# Patient Record
Sex: Male | Born: 1955 | Race: White | Hispanic: No | Smoking: Current every day smoker
Health system: Southern US, Community
[De-identification: ages and names within clinical notes are randomized; demographics above are authoritative.]

## PROBLEM LIST (undated history)

## (undated) DIAGNOSIS — N400 Enlarged prostate without lower urinary tract symptoms: Secondary | ICD-10-CM

## (undated) DIAGNOSIS — I714 Abdominal aortic aneurysm, without rupture, unspecified: Secondary | ICD-10-CM

## (undated) DIAGNOSIS — K219 Gastro-esophageal reflux disease without esophagitis: Secondary | ICD-10-CM

## (undated) DIAGNOSIS — R0600 Dyspnea, unspecified: Secondary | ICD-10-CM

## (undated) DIAGNOSIS — J449 Chronic obstructive pulmonary disease, unspecified: Secondary | ICD-10-CM

## (undated) DIAGNOSIS — I10 Essential (primary) hypertension: Secondary | ICD-10-CM

## (undated) DIAGNOSIS — R112 Nausea with vomiting, unspecified: Secondary | ICD-10-CM

## (undated) DIAGNOSIS — M545 Low back pain: Secondary | ICD-10-CM

## (undated) DIAGNOSIS — I82409 Acute embolism and thrombosis of unspecified deep veins of unspecified lower extremity: Secondary | ICD-10-CM

## (undated) DIAGNOSIS — Z86010 Personal history of colonic polyps: Secondary | ICD-10-CM

## (undated) DIAGNOSIS — I739 Peripheral vascular disease, unspecified: Secondary | ICD-10-CM

## (undated) DIAGNOSIS — F40240 Claustrophobia: Secondary | ICD-10-CM

## (undated) DIAGNOSIS — M1990 Unspecified osteoarthritis, unspecified site: Secondary | ICD-10-CM

## (undated) DIAGNOSIS — R3912 Poor urinary stream: Secondary | ICD-10-CM

## (undated) DIAGNOSIS — R569 Unspecified convulsions: Secondary | ICD-10-CM

## (undated) DIAGNOSIS — E7800 Pure hypercholesterolemia, unspecified: Secondary | ICD-10-CM

## (undated) DIAGNOSIS — Z9889 Other specified postprocedural states: Secondary | ICD-10-CM

## (undated) HISTORY — DX: Abdominal aortic aneurysm, without rupture, unspecified: I71.40

## (undated) HISTORY — DX: Gastro-esophageal reflux disease without esophagitis: K21.9

## (undated) HISTORY — DX: Peripheral vascular disease, unspecified: I73.9

## (undated) HISTORY — DX: Low back pain: M54.5

## (undated) HISTORY — DX: Claustrophobia: F40.240

## (undated) HISTORY — PX: MOHS SURGERY: SHX181

## (undated) HISTORY — PX: PR VEIN BYPASS GRAFT,AORTO-FEM-POP: 35551

## (undated) HISTORY — DX: Abdominal aortic aneurysm, without rupture: I71.4

## (undated) HISTORY — DX: Essential (primary) hypertension: I10

## (undated) HISTORY — PX: OTHER SURGICAL HISTORY: SHX169

## (undated) HISTORY — DX: Chronic obstructive pulmonary disease, unspecified: J44.9

## (undated) HISTORY — PX: FOOT SURGERY: SHX648

## (undated) HISTORY — DX: Acute embolism and thrombosis of unspecified deep veins of unspecified lower extremity: I82.409

## (undated) HISTORY — DX: Pure hypercholesterolemia, unspecified: E78.00

## (undated) HISTORY — PX: CARPAL TUNNEL RELEASE: SHX101

## (undated) HISTORY — DX: Personal history of colonic polyps: Z86.010

## (undated) HISTORY — DX: Unspecified osteoarthritis, unspecified site: M19.90

---

## 1997-11-21 ENCOUNTER — Ambulatory Visit (HOSPITAL_COMMUNITY): Admission: RE | Admit: 1997-11-21 | Discharge: 1997-11-21 | Payer: Self-pay | Admitting: Podiatry

## 1997-11-21 ENCOUNTER — Encounter: Payer: Self-pay | Admitting: Podiatry

## 1998-09-30 ENCOUNTER — Ambulatory Visit (HOSPITAL_BASED_OUTPATIENT_CLINIC_OR_DEPARTMENT_OTHER): Admission: RE | Admit: 1998-09-30 | Discharge: 1998-09-30 | Payer: Self-pay | Admitting: Orthopedic Surgery

## 2000-05-31 ENCOUNTER — Emergency Department (HOSPITAL_COMMUNITY): Admission: EM | Admit: 2000-05-31 | Discharge: 2000-05-31 | Payer: Self-pay | Admitting: Emergency Medicine

## 2002-05-22 ENCOUNTER — Ambulatory Visit (HOSPITAL_BASED_OUTPATIENT_CLINIC_OR_DEPARTMENT_OTHER): Admission: RE | Admit: 2002-05-22 | Discharge: 2002-05-22 | Payer: Self-pay | Admitting: *Deleted

## 2002-05-25 ENCOUNTER — Emergency Department (HOSPITAL_COMMUNITY): Admission: EM | Admit: 2002-05-25 | Discharge: 2002-05-25 | Payer: Self-pay | Admitting: Emergency Medicine

## 2002-06-05 ENCOUNTER — Ambulatory Visit (HOSPITAL_BASED_OUTPATIENT_CLINIC_OR_DEPARTMENT_OTHER): Admission: RE | Admit: 2002-06-05 | Discharge: 2002-06-05 | Payer: Self-pay | Admitting: *Deleted

## 2004-01-15 ENCOUNTER — Ambulatory Visit: Payer: Self-pay | Admitting: Internal Medicine

## 2004-01-23 ENCOUNTER — Ambulatory Visit: Payer: Self-pay | Admitting: Internal Medicine

## 2004-03-14 HISTORY — PX: TENDON REPAIR: SHX5111

## 2004-07-15 ENCOUNTER — Ambulatory Visit: Payer: Self-pay | Admitting: Internal Medicine

## 2004-07-23 ENCOUNTER — Ambulatory Visit: Payer: Self-pay | Admitting: Internal Medicine

## 2004-11-25 ENCOUNTER — Ambulatory Visit: Payer: Self-pay | Admitting: Internal Medicine

## 2004-11-28 ENCOUNTER — Encounter: Admission: RE | Admit: 2004-11-28 | Discharge: 2004-11-28 | Payer: Self-pay | Admitting: Internal Medicine

## 2006-04-03 ENCOUNTER — Ambulatory Visit: Payer: Self-pay | Admitting: Internal Medicine

## 2006-04-03 LAB — CONVERTED CEMR LAB
ALT: 28 units/L (ref 0–40)
AST: 25 units/L (ref 0–37)
Albumin: 4.1 g/dL (ref 3.5–5.2)
Alkaline Phosphatase: 44 units/L (ref 39–117)
BUN: 17 mg/dL (ref 6–23)
CO2: 28 meq/L (ref 19–32)
Calcium: 9.8 mg/dL (ref 8.4–10.5)
Chloride: 103 meq/L (ref 96–112)
Cholesterol: 152 mg/dL (ref 0–200)
Creatinine, Ser: 1.3 mg/dL (ref 0.4–1.5)
GFR calc Af Amer: 75 mL/min
GFR calc non Af Amer: 62 mL/min
Glucose, Bld: 117 mg/dL — ABNORMAL HIGH (ref 70–99)
HCT: 45.8 % (ref 39.0–52.0)
Hemoglobin: 16.3 g/dL (ref 13.0–17.0)
MCHC: 35.7 g/dL (ref 30.0–36.0)
MCV: 89.6 fL (ref 78.0–100.0)
PSA: 0.77 ng/mL (ref 0.10–4.00)
Platelets: 207 10*3/uL (ref 150–400)
Potassium: 3.9 meq/L (ref 3.5–5.1)
RBC: 5.11 M/uL (ref 4.22–5.81)
RDW: 12.4 % (ref 11.5–14.6)
Sodium: 139 meq/L (ref 135–145)
Total Bilirubin: 0.7 mg/dL (ref 0.3–1.2)
Total Protein: 7.3 g/dL (ref 6.0–8.3)
WBC: 9 10*3/uL (ref 4.5–10.5)

## 2006-04-07 ENCOUNTER — Ambulatory Visit: Payer: Self-pay | Admitting: Internal Medicine

## 2006-04-29 ENCOUNTER — Emergency Department (HOSPITAL_COMMUNITY): Admission: EM | Admit: 2006-04-29 | Discharge: 2006-04-29 | Payer: Self-pay | Admitting: Emergency Medicine

## 2006-06-07 ENCOUNTER — Ambulatory Visit: Payer: Self-pay | Admitting: Internal Medicine

## 2006-06-07 LAB — CONVERTED CEMR LAB
ALT: 26 units/L (ref 0–40)
AST: 29 units/L (ref 0–37)
Albumin: 4.3 g/dL (ref 3.5–5.2)
Alkaline Phosphatase: 37 units/L — ABNORMAL LOW (ref 39–117)
BUN: 16 mg/dL (ref 6–23)
Basophils Absolute: 0.1 10*3/uL (ref 0.0–0.1)
Basophils Relative: 0.7 % (ref 0.0–1.0)
Bilirubin, Direct: 0.1 mg/dL (ref 0.0–0.3)
CO2: 29 meq/L (ref 19–32)
Calcium: 9.7 mg/dL (ref 8.4–10.5)
Chloride: 103 meq/L (ref 96–112)
Cholesterol: 229 mg/dL (ref 0–200)
Creatinine, Ser: 1.2 mg/dL (ref 0.4–1.5)
Direct LDL: 60.2 mg/dL
Eosinophils Absolute: 0.4 10*3/uL (ref 0.0–0.6)
Eosinophils Relative: 6.1 % — ABNORMAL HIGH (ref 0.0–5.0)
GFR calc Af Amer: 82 mL/min
GFR calc non Af Amer: 68 mL/min
Glucose, Bld: 124 mg/dL — ABNORMAL HIGH (ref 70–99)
HCT: 44.1 % (ref 39.0–52.0)
HDL: 25.5 mg/dL — ABNORMAL LOW (ref >39.0)
Hemoglobin: 15.7 g/dL (ref 13.0–17.0)
Hgb A1c MFr Bld: 5.6 % (ref 4.6–6.0)
Lymphocytes Relative: 25.7 % (ref 12.0–46.0)
MCHC: 35.5 g/dL (ref 30.0–36.0)
MCV: 89.5 fL (ref 78.0–100.0)
Monocytes Absolute: 0.9 10*3/uL — ABNORMAL HIGH (ref 0.2–0.7)
Monocytes Relative: 12 % — ABNORMAL HIGH (ref 3.0–11.0)
Neutro Abs: 3.9 10*3/uL (ref 1.4–7.7)
Neutrophils Relative %: 55.5 % (ref 43.0–77.0)
PSA: 0.88 ng/mL (ref 0.10–4.00)
Platelets: 237 10*3/uL (ref 150–400)
Potassium: 4.4 meq/L (ref 3.5–5.1)
RBC: 4.92 M/uL (ref 4.22–5.81)
RDW: 13.1 % (ref 11.5–14.6)
Sodium: 140 meq/L (ref 135–145)
TSH: 1.24 microintl units/mL (ref 0.35–5.50)
Total Bilirubin: 0.8 mg/dL (ref 0.3–1.2)
Total CHOL/HDL Ratio: 9
Total Protein: 7.2 g/dL (ref 6.0–8.3)
Triglycerides: 884 mg/dL (ref 0–149)
VLDL: 177 mg/dL — ABNORMAL HIGH (ref 0–40)
WBC: 7.2 10*3/uL (ref 4.5–10.5)

## 2006-06-16 ENCOUNTER — Ambulatory Visit: Payer: Self-pay | Admitting: Internal Medicine

## 2006-07-07 ENCOUNTER — Encounter: Payer: Self-pay | Admitting: Internal Medicine

## 2006-10-25 ENCOUNTER — Telehealth: Payer: Self-pay | Admitting: Internal Medicine

## 2006-11-08 DIAGNOSIS — K219 Gastro-esophageal reflux disease without esophagitis: Secondary | ICD-10-CM

## 2006-11-08 DIAGNOSIS — I10 Essential (primary) hypertension: Secondary | ICD-10-CM | POA: Insufficient documentation

## 2006-11-08 HISTORY — DX: Gastro-esophageal reflux disease without esophagitis: K21.9

## 2006-11-08 HISTORY — DX: Essential (primary) hypertension: I10

## 2007-03-29 ENCOUNTER — Telehealth: Payer: Self-pay | Admitting: Internal Medicine

## 2007-05-02 ENCOUNTER — Ambulatory Visit: Payer: Self-pay | Admitting: Internal Medicine

## 2007-05-02 DIAGNOSIS — Z860100 Personal history of colon polyps, unspecified: Secondary | ICD-10-CM

## 2007-05-02 DIAGNOSIS — C44300 Unspecified malignant neoplasm of skin of unspecified part of face: Secondary | ICD-10-CM | POA: Insufficient documentation

## 2007-05-02 DIAGNOSIS — E7800 Pure hypercholesterolemia, unspecified: Secondary | ICD-10-CM

## 2007-05-02 DIAGNOSIS — C148 Malignant neoplasm of overlapping sites of lip, oral cavity and pharynx: Secondary | ICD-10-CM | POA: Insufficient documentation

## 2007-05-02 HISTORY — DX: Personal history of colonic polyps: Z86.010

## 2007-05-02 HISTORY — DX: Pure hypercholesterolemia, unspecified: E78.00

## 2007-05-02 HISTORY — DX: Personal history of colon polyps, unspecified: Z86.0100

## 2007-05-28 ENCOUNTER — Encounter: Payer: Self-pay | Admitting: Internal Medicine

## 2007-07-23 ENCOUNTER — Ambulatory Visit: Payer: Self-pay | Admitting: Internal Medicine

## 2007-07-23 LAB — CONVERTED CEMR LAB
ALT: 17 units/L (ref 0–53)
AST: 19 units/L (ref 0–37)
Albumin: 4.3 g/dL (ref 3.5–5.2)
Alkaline Phosphatase: 40 units/L (ref 39–117)
BUN: 16 mg/dL (ref 6–23)
Basophils Absolute: 0 10*3/uL (ref 0.0–0.1)
Basophils Relative: 0.6 % (ref 0.0–1.0)
Bilirubin, Direct: 0.1 mg/dL (ref 0.0–0.3)
CO2: 28 meq/L (ref 19–32)
Calcium: 9.7 mg/dL (ref 8.4–10.5)
Chloride: 105 meq/L (ref 96–112)
Cholesterol: 175 mg/dL (ref 0–200)
Creatinine, Ser: 1.3 mg/dL (ref 0.4–1.5)
Direct LDL: 73.2 mg/dL
Eosinophils Absolute: 0.3 10*3/uL (ref 0.0–0.7)
Eosinophils Relative: 4.8 % (ref 0.0–5.0)
GFR calc Af Amer: 75 mL/min
GFR calc non Af Amer: 62 mL/min
Glucose, Bld: 107 mg/dL — ABNORMAL HIGH (ref 70–99)
HCT: 47.4 % (ref 39.0–52.0)
HDL: 21.7 mg/dL — ABNORMAL LOW (ref >39.0)
Hemoglobin: 15.9 g/dL (ref 13.0–17.0)
Lymphocytes Relative: 24.8 % (ref 12.0–46.0)
MCHC: 33.5 g/dL (ref 30.0–36.0)
MCV: 91.3 fL (ref 78.0–100.0)
Monocytes Absolute: 0.7 10*3/uL (ref 0.1–1.0)
Monocytes Relative: 9.9 % (ref 3.0–12.0)
Neutro Abs: 4.3 10*3/uL (ref 1.4–7.7)
Neutrophils Relative %: 59.9 % (ref 43.0–77.0)
PSA: 0.79 ng/mL (ref 0.10–4.00)
Platelets: 222 10*3/uL (ref 150–400)
Potassium: 4.5 meq/L (ref 3.5–5.1)
RBC: 5.19 M/uL (ref 4.22–5.81)
RDW: 12.7 % (ref 11.5–14.6)
Sodium: 137 meq/L (ref 135–145)
TSH: 0.94 microintl units/mL (ref 0.35–5.50)
Total Bilirubin: 0.7 mg/dL (ref 0.3–1.2)
Total CHOL/HDL Ratio: 8.1
Total Protein: 7.7 g/dL (ref 6.0–8.3)
Triglycerides: 438 mg/dL (ref 0–149)
VLDL: 88 mg/dL — ABNORMAL HIGH (ref 0–40)
WBC: 7 10*3/uL (ref 4.5–10.5)

## 2007-08-29 ENCOUNTER — Encounter: Payer: Self-pay | Admitting: Internal Medicine

## 2007-11-22 ENCOUNTER — Ambulatory Visit: Payer: Self-pay | Admitting: Internal Medicine

## 2007-11-22 DIAGNOSIS — M47816 Spondylosis without myelopathy or radiculopathy, lumbar region: Secondary | ICD-10-CM | POA: Insufficient documentation

## 2007-11-22 DIAGNOSIS — M545 Low back pain, unspecified: Secondary | ICD-10-CM

## 2007-11-22 HISTORY — DX: Low back pain, unspecified: M54.50

## 2009-04-07 ENCOUNTER — Ambulatory Visit: Payer: Self-pay | Admitting: Internal Medicine

## 2009-04-07 LAB — CONVERTED CEMR LAB
Albumin: 4.3 g/dL (ref 3.5–5.2)
Alkaline Phosphatase: 42 units/L (ref 39–117)
Basophils Relative: 0.8 % (ref 0.0–3.0)
Bilirubin Urine: NEGATIVE
Blood in Urine, dipstick: NEGATIVE
CO2: 28 meq/L (ref 19–32)
Chloride: 106 meq/L (ref 96–112)
Direct LDL: 74.5 mg/dL
Eosinophils Absolute: 0.3 10*3/uL (ref 0.0–0.7)
Glucose, Bld: 92 mg/dL (ref 70–99)
Glucose, Urine, Semiquant: NEGATIVE
HCT: 45.6 % (ref 39.0–52.0)
Hemoglobin: 14.9 g/dL (ref 13.0–17.0)
Ketones, urine, test strip: NEGATIVE
Lymphs Abs: 1.8 10*3/uL (ref 0.7–4.0)
MCHC: 32.6 g/dL (ref 30.0–36.0)
MCV: 94.5 fL (ref 78.0–100.0)
Monocytes Absolute: 0.7 10*3/uL (ref 0.1–1.0)
Neutro Abs: 3.6 10*3/uL (ref 1.4–7.7)
Nitrite: NEGATIVE
PSA: 1.05 ng/mL (ref 0.10–4.00)
Protein, U semiquant: NEGATIVE
RBC: 4.83 M/uL (ref 4.22–5.81)
Sodium: 140 meq/L (ref 135–145)
Specific Gravity, Urine: 1.01
TSH: 1.54 microintl units/mL (ref 0.35–5.50)
Total CHOL/HDL Ratio: 6
Total Protein: 7.7 g/dL (ref 6.0–8.3)
Urobilinogen, UA: 0.2
WBC Urine, dipstick: NEGATIVE
pH: 5.5

## 2009-04-14 ENCOUNTER — Ambulatory Visit: Payer: Self-pay | Admitting: Internal Medicine

## 2009-04-16 ENCOUNTER — Telehealth: Payer: Self-pay | Admitting: Internal Medicine

## 2009-04-16 ENCOUNTER — Encounter: Payer: Self-pay | Admitting: Internal Medicine

## 2009-04-17 ENCOUNTER — Encounter: Payer: Self-pay | Admitting: Internal Medicine

## 2009-04-17 ENCOUNTER — Encounter: Admission: RE | Admit: 2009-04-17 | Discharge: 2009-04-17 | Payer: Self-pay | Admitting: Emergency Medicine

## 2009-04-28 ENCOUNTER — Encounter: Payer: Self-pay | Admitting: Internal Medicine

## 2009-05-21 ENCOUNTER — Encounter (INDEPENDENT_AMBULATORY_CARE_PROVIDER_SITE_OTHER): Payer: Self-pay | Admitting: *Deleted

## 2009-06-01 ENCOUNTER — Encounter: Payer: Self-pay | Admitting: Internal Medicine

## 2010-04-15 NOTE — Miscellaneous (Signed)
Summary: Orders Update  Clinical Lists Changes  Orders: Added new Referral order of Neurosurgeon Referral (Neurosurgeon) - Signed 

## 2010-04-15 NOTE — Letter (Signed)
Summary: [facility]  [facility]   Imported By: Maryln Gottron 06/24/2009 12:16:27  _____________________________________________________________________  External Attachment:    Type:   Image     Comment:   External Document

## 2010-04-15 NOTE — Consult Note (Signed)
Summary: [facility]  [facility]   Imported By: Sherian Rein 05/14/2009 13:41:55  _____________________________________________________________________  External Attachment:    Type:   Image     Comment:   External Document

## 2010-04-15 NOTE — Letter (Signed)
Summary: [facility] Referral (unable to schedule) Notification  [facility]  [address] [place], [place] [address]   Phone: [phone]  Fax: [phone]      May 21, 2009 [name] Oct 14, 1955 MRN: [id number]   [facility] [address] [place], [place]  [address]   Dear Dr. Amador Cunas:   Thank you for your kind referral of the above patient. We have attempted to schedule the recommended COLONOSCOPY but have been unable to schedule because:  __ The patient was not available by phone and/or has not returned our calls.  _X_ The patient declined to schedule the procedure at this time. He says he is having problems with his back and might even need surgery.  We appreciate the referral and hope that we will have the opportunity to treat this patient in the future.    Sincerely,   [facility]  [name] M.D. [name]. [name] M.D. [name]. [name] M.D. [name]. [name] M.D. [name]. [name] M.D. [name] M.D. [name].D.

## 2010-04-15 NOTE — Progress Notes (Signed)
Summary: please return call  Phone Note Call from Patient Call back at Home Phone [phone]   Caller: [facility] mail Reason for Call: Talk to Nurse Summary of Call: wants Darel Hong to return call, please. No further message was left. Initial call taken by: Warnell Forester,  April 16, 2009 9:33 AM  Follow-up for Phone Call        pain in leg very bad; getting MRI tomorrow. Needs stronger med- not sleeping. Can you take him out of work? Follow-up by: Raechel Ache, RN,  April 16, 2009 9:46 AM  Additional Follow-up for Phone Call Additional follow up Details #1::        called-note up front for pick-up. Additional Follow-up by: Raechel Ache, RN,  April 16, 2009 1:39 PM    OK to take 2 vicodin every 6 hours;

## 2010-04-15 NOTE — Letter (Signed)
Summary: Out of Work  Adult nurse at [employer]  [address]   [place], [place] [address]   Phone: [phone]  Fax: [phone]    April 16, 2009   Employee:  [name]    To Whom It May Concern:   For Medical reasons, please excuse the above named employee from work for the following dates:  Start:   04-14-2009  End:   04-21-2009  If you need additional information, please feel free to contact our office.         Sincerely,    Gordy Savers  MD

## 2010-04-15 NOTE — Assessment & Plan Note (Signed)
Summary: CPX // RS   Vital Signs:  Patient profile:   55 year old [name] Weight:      217 pounds Temp:     98.3 degrees F oral BP sitting:   140 / 90  (right arm) Cuff size:   large  Vitals Entered By: Raechel Ache, RN (April 14, 2009 1:27 PM) CC: CPX  labs done , c/o LUQ pain moves to back , pain in left leg   CC:  CPX  labs done , c/o LUQ pain moves to back , and pain in left leg.  History of Present Illness: 55 year old patient who is seen today for a wellness exam.  Medical problems include chronic low back pain, dyslipidemia, and hypertension.  He has not been  seen in over one year. A couple of weeks ago, the patient had experienced a lot of  right upper  quadrant pain that was aggravated by meals.  This resolved after several days, and he started to have left flank discomfort.  This also has resolved, and presently he has pain in the left buttock and hip region and a constant pain and dysesthesia involving his left anterior and lateral thigh region.  Denies any motor weakness.  He is walking with a cane.  However. He denies any cardiopulmonary complaints.  He does have a history also of colonic polyps  Allergies: 1)  ! Sulfa 2)  ! Cortisone  Past History:  Past Medical History: Reviewed history from 11/22/2007 and no changes required. GERD Hypertension Hypercholesterolemia Colonic polyps, hx of Low back pain  Past Surgical History: right foot surgery x 2 status post Mohs surgery, squamous cell carcinoma, lower lip no recent colonoscopy  Family History: Reviewed history from 11/22/2007 and no changes required. Family History of CAD [name] 1st degree relative <60 Fam hx MI Fam hx CHF father died at age 83, MI mother died recently of complications of diabetes, heart failure, coronary artery disease  4 sisters; 1 brother one sister died of complications of chronic thromboembolic disease  Social History: Reviewed history from 11/08/2006 and no changes  required. Current Smoker Alcohol use-no Married  Review of Systems       The patient complains of difficulty walking.  The patient denies anorexia, fever, weight loss, weight gain, vision loss, decreased hearing, hoarseness, chest pain, syncope, dyspnea on exertion, peripheral edema, prolonged cough, headaches, hemoptysis, abdominal pain, melena, hematochezia, severe indigestion/heartburn, hematuria, incontinence, genital sores, muscle weakness, suspicious skin lesions, transient blindness, depression, unusual weight change, abnormal bleeding, enlarged lymph nodes, angioedema, breast masses, and testicular masses.    Physical Exam  General:  overweight-appearing.  140/80overweight-appearing.   Head:  Normocephalic and atraumatic without obvious abnormalities. No apparent alopecia or balding. Eyes:  No corneal or conjunctival inflammation noted. EOMI. Perrla. Funduscopic exam benign, without hemorrhages, exudates or papilledema. Vision grossly normal. Ears:  External ear exam shows no significant lesions or deformities.  Otoscopic examination reveals clear canals, tympanic membranes are intact bilaterally without bulging, retraction, inflammation or discharge. Hearing is grossly normal bilaterally. Nose:  External nasal examination shows no deformity or inflammation. Nasal mucosa are pink and moist without lesions or exudates. Mouth:  Oral mucosa and oropharynx without lesions or exudates.  Teeth in good repair. Neck:  No deformities, masses, or tenderness noted. Chest Wall:  No deformities, masses, tenderness or gynecomastia noted. Breasts:  No masses or gynecomastia noted Lungs:  Normal respiratory effort, chest expands symmetrically. Lungs are clear to auscultation, no crackles or wheezes. Heart:  Normal rate and regular rhythm. S1 and S2 normal without gallop, murmur, click, rub or other extra sounds. Abdomen:   suggestion of some mild right upper quadrant pain; no flank tenderness  No  organomegaly or masses Rectal:  No external abnormalities noted. Normal sphincter tone. No rectal masses or tenderness. Genitalia:  Testes bilaterally descended without nodularity, tenderness or masses. No scrotal masses or lesions. No penis lesions or urethral discharge. Prostate:  2+ enlarged.  2+ enlarged.   Msk:  No deformity or scoliosis noted of thoracic or lumbar spine.   negative straight leg test; range of motion left hip appeared to be intact right ankle brace Pulses:  R and L carotid,radial,femoral,dorsalis pedis and posterior tibial pulses are full and equal bilaterally Extremities:  No clubbing, cyanosis, edema, or deformity noted with normal full range of motion of all joints.   Neurologic:  alert & oriented X3, cranial nerves II-XII intact, and strength normal in all extremities.   the left patellar reflex was increased compared to the right; no motor weakness or atrophy noted; able to walk on his toes and heels without difficulty; decreased sensation in the left anterolateral thigh area Skin:  Intact without suspicious lesions or rashes Cervical Nodes:  No lymphadenopathy noted Axillary Nodes:  No palpable lymphadenopathy Inguinal Nodes:  No significant adenopathy Psych:  Cognition and judgment appear intact. Alert and cooperative with normal attention span and concentration. No apparent delusions, illusions, hallucinations   Impression & Recommendations:  Problem # 1:  LOW BACK PAIN (ICD-724.2)  His updated medication list for this problem includes:    Vicodin 5-500 Mg Tabs (Hydrocodone-acetaminophen) .Marland Kitchen... 1 q6h as needed    Aspirin 81 Mg Tabs (Aspirin) .Marland Kitchen... Take 1 tablet by mouth once a day patient now has pain in the left hip associated with dysesthesias involving his left anterolateral leg-he reminds quite uncomfortable with left thigh pain.  Will proceed with a lumbar MRI His updated medication list for this problem includes:    Vicodin 5-500 Mg Tabs  (Hydrocodone-acetaminophen) .Marland Kitchen... 1 q6h as needed    Aspirin 81 Mg Tabs (Aspirin) .Marland Kitchen... Take 1 tablet by mouth once a day  Orders: Radiology Referral (Radiology)  Problem # 2:  HYPERCHOLESTEROLEMIA, SEVERE (ICD-272.0)  His updated medication list for this problem includes:    Pravastatin Sodium 40 Mg Tabs (Pravastatin sodium) .Marland Kitchen... 2 daily    Niacin Cr 500 Mg Cr-caps (Niacin) .Marland Kitchen... 2 at bedtime  His updated medication list for this problem includes:    Pravastatin Sodium 40 Mg Tabs (Pravastatin sodium) .Marland Kitchen... 2 daily due to a low HDL cholesterol. will give a tile of niacin  Problem # 3:  COLONIC POLYPS, HX OF (ICD-V12.72)  needs follow-up colonoscopy  Orders: Gastroenterology Referral (GI)  Problem # 4:  HYPERTENSION (ICD-401.9)  His updated medication list for this problem includes:    Benazepril Hcl 40 Mg Tabs (Benazepril hcl) ..... One daily    Hydrochlorothiazide 25 Mg Tabs (Hydrochlorothiazide) ..... One daily  His updated medication list for this problem includes:    Benazepril Hcl 40 Mg Tabs (Benazepril hcl) ..... One daily    Hydrochlorothiazide 25 Mg Tabs (Hydrochlorothiazide) ..... One daily  Complete Medication List: 1)  Vicodin 5-500 Mg Tabs (Hydrocodone-acetaminophen) .Marland Kitchen.. 1 q6h as needed 2)  Aspirin 81 Mg Tabs (Aspirin) .... Take 1 tablet by mouth once a day 3)  Prilosec Otc 20 Mg Tbec (Omeprazole magnesium) .Marland Kitchen.. 1 once daily 4)  Pravastatin Sodium 40 Mg Tabs (Pravastatin sodium) .... 2 daily  5)  Alprazolam 0.5 Mg Tabs (Alprazolam) .... One twice daily as needed 6)  Benazepril Hcl 40 Mg Tabs (Benazepril hcl) .... One daily 7)  Hydrochlorothiazide 25 Mg Tabs (Hydrochlorothiazide) .... One daily 8)  Klor-con 20 Meq Pack (Potassium chloride) .... One daily 9)  [facility]vs Vitamin E 400 Unit Caps (Vitamin e) .... Once daily 10)  Daily Vitamins Tabs (Multiple vitamin) .... Once daily 11)  Niacin Cr 500 Mg Cr-caps (Niacin) .... 2 at bedtime  Patient Instructions: 1)   Schedule a colonoscopy/sigmoidoscopy to help detect colon cancer. 2)  lumbar MRI 3)  Please schedule a follow-up appointment in 3 months. 4)  Limit your Sodium (Salt). 5)  It is important that you exercise regularly at least 20 minutes 5 times a week. If you develop chest pain, have severe difficulty breathing, or feel very tired , stop exercising immediately and seek medical attention. 6)  You need to lose weight. Consider a lower calorie diet and regular exercise.  7)  Check your Blood Pressure regularly. If it is above: 150/90 you should make an appointment. Prescriptions: NIACIN CR 500 MG CR-CAPS (NIACIN) 2 at bedtime  #180 x 6   Entered and Authorized by:   Gordy Savers  MD   Signed by:   Gordy Savers  MD on 04/14/2009   Method used:   Print then Give to Patient   RxID:   [id number] KLOR-CON 20 MEQ  PACK (POTASSIUM CHLORIDE) one daily  #90 x 6   Entered and Authorized by:   Gordy Savers  MD   Signed by:   Gordy Savers  MD on 04/14/2009   Method used:   Print then Give to Patient   RxID:   [id number] HYDROCHLOROTHIAZIDE 25 MG  TABS (HYDROCHLOROTHIAZIDE) one daily  #90 x 6   Entered and Authorized by:   Gordy Savers  MD   Signed by:   Gordy Savers  MD on 04/14/2009   Method used:   Print then Give to Patient   RxID:   [id number] BENAZEPRIL HCL 40 MG  TABS (BENAZEPRIL HCL) one daily  #90 Each x 5   Entered and Authorized by:   Gordy Savers  MD   Signed by:   Gordy Savers  MD on 04/14/2009   Method used:   Print then Give to Patient   RxID:   [id number] ALPRAZOLAM 0.5 MG  TABS (ALPRAZOLAM) one twice daily as needed  #90 x 2   Entered and Authorized by:   Gordy Savers  MD   Signed by:   Gordy Savers  MD on 04/14/2009   Method used:   Print then Give to Patient   RxID:   [id number] PRAVASTATIN SODIUM 40 MG  TABS (PRAVASTATIN SODIUM) 2 daily  #180 Each x 5   Entered and Authorized by:    Gordy Savers  MD   Signed by:   Gordy Savers  MD on 04/14/2009   Method used:   Print then Give to Patient   RxID:   [id number] PRILOSEC OTC 20 MG  TBEC (OMEPRAZOLE MAGNESIUM) 1 once daily  #90 x 6   Entered and Authorized by:   Gordy Savers  MD   Signed by:   Gordy Savers  MD on 04/14/2009   Method used:   Print then Give to Patient   RxID:   [id number] VICODIN 5-500 MG TABS (HYDROCODONE-ACETAMINOPHEN) 1 q6h as needed  #90 x 2  Entered and Authorized by:   Gordy Savers  MD   Signed by:   Gordy Savers  MD on 04/14/2009   Method used:   Print then Give to Patient   RxID:   [id number]

## 2010-04-20 ENCOUNTER — Other Ambulatory Visit: Payer: Self-pay | Admitting: Internal Medicine

## 2010-04-20 DIAGNOSIS — R52 Pain, unspecified: Secondary | ICD-10-CM

## 2010-04-20 DIAGNOSIS — I10 Essential (primary) hypertension: Secondary | ICD-10-CM

## 2010-04-20 NOTE — Telephone Encounter (Signed)
Ok number 90, no refill;  Schedule annual exam

## 2010-04-20 NOTE — Telephone Encounter (Signed)
Please advise 

## 2010-04-20 NOTE — Telephone Encounter (Signed)
Called in both rx's to [facility]. Must be seen for annual exam - last seen 04/2009 - no future refills until seen. KIK

## 2010-06-06 ENCOUNTER — Other Ambulatory Visit: Payer: Self-pay | Admitting: Internal Medicine

## 2010-06-22 ENCOUNTER — Encounter: Payer: Self-pay | Admitting: Internal Medicine

## 2010-06-23 ENCOUNTER — Encounter: Payer: Self-pay | Admitting: Internal Medicine

## 2010-06-23 ENCOUNTER — Ambulatory Visit (INDEPENDENT_AMBULATORY_CARE_PROVIDER_SITE_OTHER): Payer: Managed Care, Other (non HMO) | Admitting: Internal Medicine

## 2010-06-23 DIAGNOSIS — M79609 Pain in unspecified limb: Secondary | ICD-10-CM

## 2010-06-23 DIAGNOSIS — M79669 Pain in unspecified lower leg: Secondary | ICD-10-CM

## 2010-06-23 DIAGNOSIS — Z86010 Personal history of colonic polyps: Secondary | ICD-10-CM

## 2010-06-23 DIAGNOSIS — Z021 Encounter for pre-employment examination: Secondary | ICD-10-CM

## 2010-06-23 DIAGNOSIS — I10 Essential (primary) hypertension: Secondary | ICD-10-CM

## 2010-06-23 DIAGNOSIS — I739 Peripheral vascular disease, unspecified: Secondary | ICD-10-CM

## 2010-06-23 DIAGNOSIS — E7800 Pure hypercholesterolemia, unspecified: Secondary | ICD-10-CM

## 2010-06-23 DIAGNOSIS — K219 Gastro-esophageal reflux disease without esophagitis: Secondary | ICD-10-CM

## 2010-06-23 DIAGNOSIS — Z0289 Encounter for other administrative examinations: Secondary | ICD-10-CM

## 2010-06-23 LAB — BASIC METABOLIC PANEL
BUN: 20 mg/dL (ref 6–23)
CO2: 26 mEq/L (ref 19–32)
Calcium: 9.8 mg/dL (ref 8.4–10.5)
Creatinine, Ser: 1.6 mg/dL — ABNORMAL HIGH (ref 0.4–1.5)
Glucose, Bld: 92 mg/dL (ref 70–99)

## 2010-06-23 LAB — CBC WITH DIFFERENTIAL/PLATELET
Basophils Relative: 0.4 % (ref 0.0–3.0)
Eosinophils Relative: 3 % (ref 0.0–5.0)
HCT: 46.8 % (ref 39.0–52.0)
Hemoglobin: 16.3 g/dL (ref 13.0–17.0)
Lymphs Abs: 2 10*3/uL (ref 0.7–4.0)
MCV: 92.3 fl (ref 78.0–100.0)
Monocytes Relative: 10.2 % (ref 3.0–12.0)
Neutro Abs: 5.8 10*3/uL (ref 1.4–7.7)
WBC: 8.9 10*3/uL (ref 4.5–10.5)

## 2010-06-23 LAB — LIPID PANEL
Cholesterol: 196 mg/dL (ref 0–200)
Triglycerides: 556 mg/dL — ABNORMAL HIGH (ref 0.0–149.0)

## 2010-06-23 LAB — PSA: PSA: 1.19 ng/mL (ref 0.10–4.00)

## 2010-06-23 LAB — HEPATIC FUNCTION PANEL
Albumin: 4.4 g/dL (ref 3.5–5.2)
Alkaline Phosphatase: 50 U/L (ref 39–117)
Total Protein: 7.8 g/dL (ref 6.0–8.3)

## 2010-06-23 LAB — TSH: TSH: 1.03 u[IU]/mL (ref 0.35–5.50)

## 2010-06-23 MED ORDER — AMPHETAMINE-DEXTROAMPHETAMINE 10 MG PO TABS: 10.0000 mg | ORAL_TABLET | Freq: Every day | ORAL | Status: DC

## 2010-06-23 NOTE — Patient Instructions (Signed)
Limit your sodium (Salt) intake    It is important that you exercise regularly, at least 20 minutes 3 to 4 times per week.  If you develop chest pain or shortness of breath seek  medical attention. 

## 2010-06-23 NOTE — Progress Notes (Signed)
  Subjective:    Patient ID: [name], male    DOB: 02-25-1956, 55 y.o.   MRN: [id number]  HPI  55 year old patient who presents with a two-month history of pain in the right posterior lateral calf region. There has been no trauma. Pain has slowly worsened. Pain is aggravated by walking but also is locally tender.  The patient did have a lumbar MRI performed in January of last year that revealed a 3.7 cm abdominal aortic aneurysm. He states his sister died of complications of thromboembolic disease. He is worried about a blood clot involving the right leg cardiovascular risk factors include hypertension and dyslipidemia.    Review of Systems  Constitutional: Negative for fever, chills, appetite change and fatigue.  HENT: Negative for hearing loss, ear pain, congestion, sore throat, trouble swallowing, neck stiffness, dental problem, voice change and tinnitus.   Eyes: Negative for pain, discharge and visual disturbance.  Respiratory: Negative for cough, chest tightness, wheezing and stridor.   Cardiovascular: Negative for chest pain, palpitations and leg swelling.  Gastrointestinal: Negative for nausea, vomiting, abdominal pain, diarrhea, constipation, blood in stool and abdominal distention.  Genitourinary: Negative for urgency, hematuria, flank pain, discharge, difficulty urinating and genital sores.  Musculoskeletal: Negative for myalgias, back pain, joint swelling, arthralgias and gait problem.       [Two-month history of right calf pain Skin: Negative for rash.  Neurological: Negative for dizziness, syncope, speech difficulty, weakness, numbness and headaches.  Hematological: Negative for adenopathy. Does not bruise/bleed easily.  Psychiatric/Behavioral: Negative for behavioral problems and dysphoric mood. The patient is not nervous/anxious.        Objective:   Physical Exam  Constitutional: He appears well-developed and well-nourished. No distress.       Blood pressure well  controlled  Cardiovascular:       The left femoral pulse may have been diminished no femoral bruits appreciated  The left posterior tibial pulse was intact. The pedal pulses were nonpalpable  No obvious ischemic changes noted  Musculoskeletal:       Mild tenderness was noted involving the right posterior calf region. No  swelling  or redness noted          Assessment & Plan:  Right calf pain. Family history of thromboembolic disease. We'll check a venous Doppler to exclude DVT PAD with 3.7 cm abdominal aortic aneurysm and diminished pedal pulses. We'll check arterial Dopplers as well  Laboratory update We'll schedule complete physical exam and do a followup abdominal aortic aneurysm ultrasound at that time

## 2010-06-25 ENCOUNTER — Other Ambulatory Visit: Payer: Self-pay | Admitting: Internal Medicine

## 2010-06-25 ENCOUNTER — Encounter (INDEPENDENT_AMBULATORY_CARE_PROVIDER_SITE_OTHER): Payer: Managed Care, Other (non HMO) | Admitting: *Deleted

## 2010-06-25 ENCOUNTER — Other Ambulatory Visit: Payer: Self-pay | Admitting: Cardiology

## 2010-06-25 DIAGNOSIS — I739 Peripheral vascular disease, unspecified: Secondary | ICD-10-CM

## 2010-06-25 DIAGNOSIS — I70219 Atherosclerosis of native arteries of extremities with intermittent claudication, unspecified extremity: Secondary | ICD-10-CM

## 2010-06-25 DIAGNOSIS — M79669 Pain in unspecified lower leg: Secondary | ICD-10-CM

## 2010-07-02 ENCOUNTER — Encounter: Payer: Self-pay | Admitting: Internal Medicine

## 2010-07-05 ENCOUNTER — Telehealth: Payer: Self-pay

## 2010-07-05 DIAGNOSIS — I739 Peripheral vascular disease, unspecified: Secondary | ICD-10-CM

## 2010-07-05 NOTE — Telephone Encounter (Signed)
Spoke with pt - informed of need for referral to vascular - terri will contact once appt made. KIK

## 2010-07-05 NOTE — Telephone Encounter (Signed)
Attempt to call - ans mach - hm/cell# - LMTCB to discuss test and futher instructions from Dr. Amador Cunas - order placed to vascular to eval and treat - r leg claudication

## 2010-07-29 ENCOUNTER — Encounter: Payer: Managed Care, Other (non HMO) | Admitting: Vascular Surgery

## 2010-07-30 NOTE — Assessment & Plan Note (Signed)
[facility]                                 ON-CALL NOTE   NAME:CAPPSBaldo, Hufnagle                        MRN:          [id number]  DATE:04/29/2006                            DOB:          [birth year]    PRIMARY CARE PHYSICIAN:  Dr. Amador Cunas.   CALLER:  His wife.   PHONE:  [phone].   SUBJECTIVE:  Nose bleeding since this morning, has not stopped in over  1/2 hour.  Blood pressure systolic is 134.   ASSESSMENT AND PLAN:  Recommended going immediately to the emergency  room because nose bleeding for this period of time can be life  threatening.     Kerby Nora, MD  Electronically Signed    AB/MedQ  DD: 04/30/2006  DT: 04/30/2006  Job #: [id number]

## 2010-07-30 NOTE — Op Note (Signed)
   NAME:  [name], [name]                         ACCOUNT NO.:  [id number]   MEDICAL RECORD NO.:  [id number]                   PATIENT TYPE:  AMB   LOCATION:  [facility]                                  FACILITY:  [facility]   PHYSICIAN:  Lowell Bouton, M.D.      DATE OF BIRTH:  24-Sep-1955   DATE OF PROCEDURE:  06/05/2002  DATE OF DISCHARGE:                                 OPERATIVE REPORT   PREOPERATIVE DIAGNOSIS:  Right carpal tunnel syndrome.   POSTOPERATIVE DIAGNOSIS:  Right carpal tunnel syndrome.   PROCEDURE:  Decompression, median nerve, right carpal tunnel.   SURGEON:  Lowell Bouton, M.D.   ANESTHESIA:  0.5% Marcaine local with sedation.   OPERATIVE FINDINGS:  The patient had significant pressure on his median  nerve.  The motor branch was intact, and there were no masses present in the  carpal canal.   DESCRIPTION OF PROCEDURE:  Under 0.5% Marcaine local anesthesia with [name]  tourniquet on the right arm, the right hand was prepped and draped in the  usual fashion.  After exsanguinating the limb, the tourniquet was inflated  to 250 mmHg.  [name] 3 cm longitudinal incision was made in the palm just ulnar  to the thenar crease.  Sharp dissection was carried through the subcutaneous  tissues.  Blunt dissection was carried through the superficial palmar fascia  distal to the transverse carpal ligament.  [name] hemostat was then placed in the  carpal canal up against the hook of the hamate and the transverse carpal  ligament was divided on the ulnar border of the median nerve.  The proximal  end of the ligament was divided with the scissors after dissecting the nerve  away from the undersurface of the ligament.  The carpal canal was then  palpated and was found to be adequately decompressed.  The nerve was  examined and the motor branch identified.  The wound was irrigated with  saline, and the skin was closed with 4-0 nylon suture.  Sterile dressings  were applied.  The  tourniquet was released with good circulation to the  hand.  The patient went to the recovery room awake and stable, in good  condition.                                               Lowell Bouton, M.D.    EMM/MEDQ  D:  06/05/2002  T:  06/06/2002  Job:  [id number]   cc:   Gordy Savers, M.D. [facility]

## 2010-07-30 NOTE — Assessment & Plan Note (Signed)
[facility]                            [place] OFFICE NOTE   NAME:[name], [name]                      MRN:          [id number]  DATE:06/16/2006                            DOB:          05-25-1955    A 55 year old gentleman seen today for an annual exam.  He has  hypertension, dyslipidemia, also gastroesophageal reflux disease and a  history of polyps.   PAST MEDICAL HISTORY:  Fairly unremarkable, otherwise has had right foot  surgery, operated on on 2 prior occasions.  He states that he has a  history of colonic polyps and last colonoscopy was in excess of 10 years  ago.  He has history also of ongoing tobacco use.   ALLERGIES:  SULFA AND CORTISONE.   FAMILY HISTORY:  Father died at 17 of an MI.  Mother has coronary artery  disease in her 58s, also heart failure and diabetes.  One brother and 4  sisters remain well.   EXAMINATION:  Revealed a mildly overweight male, in no acute distress.  Blood pressure was 120/78, slightly higher in the left arm.  FUNDI, EAR, NOSE, AND THROAT:  Clear.  NECK:  No bruits.  CHEST:  Clear.  CARDIOVASCULAR:  Normal heart sounds, no murmurs.  ABDOMEN:  Obese, soft, nontender, no organomegaly.  EXTERNAL GENITALIA:  Normal.  RECTAL:  Prostate +1 and benign, stool heme negative.  EXTREMITIES:  Full peripheral pulses, no edema.  NEURO:  Negative.   IMPRESSION:  1. Hypertension.  2. Tobacco use.  3. Hypercholesterolemia.  4. Gastroesophageal reflux disease.   DISPOSITION:  Present regimen unchanged.  Cessation of smoking discussed  and encouraged.  Will set up for a colonoscopy.  In view of his chronic  left knee pain we will set up also for orthopedic evaluation.     Gordy Savers, MD  Electronically Signed    PFK/MedQ  DD: 06/16/2006  DT: 06/16/2006  Job #: [id number]

## 2010-07-30 NOTE — Op Note (Signed)
   NAME:  [name], [name]                         ACCOUNT NO.:  [id number]   MEDICAL RECORD NO.:  [id number]                   PATIENT TYPE:  AMB   LOCATION:  DSC                                  FACILITY:  [facility]   PHYSICIAN:  Lowell Bouton, M.D.      DATE OF BIRTH:  20-Sep-1955   DATE OF PROCEDURE:  05/22/2002  DATE OF DISCHARGE:                                 OPERATIVE REPORT   PREOPERATIVE DIAGNOSIS:  Left carpal tunnel syndrome.   POSTOPERATIVE DIAGNOSIS:  Left carpal tunnel syndrome.   PROCEDURE:  Decompression of median nerve left carpal tunnel.   SURGEON:  Lowell Bouton, M.D.   ANESTHESIA:  0.5% Marcaine local with sedation.   FINDINGS:  The patient had [name] very narrow carpal canal with significant  pressure on the nerve. The motor branch was intact.   DESCRIPTION OF PROCEDURE:  Under 0.5% Marcaine local anesthesia with [name]  tourniquet on the left arm, the left hand was prepped and draped in the  usual sterile fashion. After exsanguinating the limb, the tourniquet was  inflated to 250 mmHg. [name] 3 cm longitudinal incision was made in the palm just  ulnar to the thenar crease. Sharp dissection was carried through the  subcutaneous tissues and bleeding points were coagulated. Blunt dissection  was carried down through the superficial palmar fascia and the end of the  carpal tunnel was identified. [name] hemostat was placed in the carpal tunnel up  against the hook of the hamate and the transverse carpal ligament was  divided on the ulnar border of the median nerve. The proximal end of the  ligament was divided with the scissors after dissecting the nerve away from  the undersurface of the ligament. The carpal canal was then palpated and was  found to be adequately decompressed.  The nerve was examined and the motor  branch identified. No masses were present in the carpal tunnel. The wound  was irrigated with saline. The skin was closed with 4-0 nylon sutures.  Sterile dressings were applied followed by [name] volar wrist splint. The patient  tolerated the procedure well and went to the recovery room awake, stable,  and in good condition.                                               Lowell Bouton, M.D.    EMM/MEDQ  D:  05/22/2002  T:  05/22/2002  Job:  [id number]   cc:   Gordy Savers, M.D. [facility]

## 2010-08-03 ENCOUNTER — Encounter (INDEPENDENT_AMBULATORY_CARE_PROVIDER_SITE_OTHER): Payer: Managed Care, Other (non HMO) | Admitting: Vascular Surgery

## 2010-08-03 DIAGNOSIS — I714 Abdominal aortic aneurysm, without rupture: Secondary | ICD-10-CM

## 2010-08-03 DIAGNOSIS — I70219 Atherosclerosis of native arteries of extremities with intermittent claudication, unspecified extremity: Secondary | ICD-10-CM

## 2010-08-03 DIAGNOSIS — I7092 Chronic total occlusion of artery of the extremities: Secondary | ICD-10-CM

## 2010-08-03 NOTE — Consult Note (Signed)
NEW PATIENT CONSULTATION  [name], [name] DOB:  Oct 05, 1955                                       08/03/2010 [id number]#:[id number]  Patient presents today for evaluation of right leg claudication and [name] small abdominal aortic aneurysm.  He reports limiting calf claudication which makes it very difficult for him to work.  He does not have any resting symptoms.  When he walks, he has tightness of his calf that is relieved by rest.  He does not have any tissue loss.  He does have [name] known history of [name] small infrarenal abdominal aortic aneurysm with [name] maximal diameter of approximately 3.7 cm.  He does have [name] history of hypertension and elevated cholesterol.  SOCIAL HISTORY:  He is married with 2 children.  He works at [employer].  Does [name] great deal of walking and heavy lifting.  He does smoke 1-1/2 packs of cigarettes per day, and I explained the importance of smoking cessation.  He does not drink alcohol.  FAMILY HISTORY:  Significant for congestive heart failure in his mother, myocardial function in his father at age 59, myocardial function in [name] brother at age 6, and pulmonary embolus in his sister at age 64.  REVIEW OF SYSTEMS:  No weight loss or gain.  Weight is 216 pounds.  He is 6 feet tall. VASCULAR:  Positive for pain in his calf with walking. CARDIAC:  Shortness of breath when lying flat. GI:  Reflux and hiatal hernia. NEUROLOGIC/PULMONARY:  Negative. ENT:  Change in hearing. MUSCULOSKELETAL:  Positive for arthritis, joint pain, or muscle pain. PSYCHIATRIC:  For anxiety. 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  PHYSICAL EXAMINATION:  [name] well-developed and well-nourished white male in no acute distress.  Blood pressure is 118/76, pulse 78, respirations 18, oxygen saturation is 99.  HEENT:  Normal.  Chest:  Clear bilaterally without rales, rhonchi or wheezes.  Heart is [name] regular rate and rhythm without murmur.  He does have formal carotid pulses without  bruits bilaterally.  He has 2+ radial and 2+ femoral pulses.  He has absent right popliteal and distal pulse.  He has [name] normal 2+ dorsalis pedis pulse on the left.  Musculoskeletal shows no major deformity or cyanosis.  Neurologic:  No focal weakness or paresthesias.  Skin without ulcers or rashes.  I reviewed his MRI showing [name] 3.7 cm aneurysm, and also noninvasive laboratory studies from [facility] on 06/25/10 that shows an ankle arm index of 0.5 on the right and normal at 1.0 on the left.  He does have [name] palpable popliteal pulse on the left with evidence of occlusion at the popliteal.  I discussed this at length with patient.  He is severely limited by this, and I have recommended arteriography for evaluation.  I think this most likely was related to atherosclerotic disease, but certainly could be related to embolus from his aneurysm or even [name] popliteal artery aneurysm with his amount of aortic aneurysm.  We will make further recommendations pending this evaluation.    Larina Earthly, M.D. Electronically Signed  TFE/MEDQ  D:  08/03/2010  T:  08/03/2010  Job:  [id number]  cc:   Gordy Savers, MD

## 2010-08-05 ENCOUNTER — Encounter: Payer: Managed Care, Other (non HMO) | Admitting: Vascular Surgery

## 2010-08-10 ENCOUNTER — Ambulatory Visit (HOSPITAL_COMMUNITY)
Admission: RE | Admit: 2010-08-10 | Discharge: 2010-08-10 | Disposition: A | Discharge status: Home / Self Care | Payer: Managed Care, Other (non HMO) | Source: Ambulatory Visit | Attending: Surgery | Admitting: Surgery

## 2010-08-10 DIAGNOSIS — I70219 Atherosclerosis of native arteries of extremities with intermittent claudication, unspecified extremity: Secondary | ICD-10-CM | POA: Insufficient documentation

## 2010-08-10 LAB — POCT I-STAT, CHEM 8
Calcium, Ion: 1.09 mmol/L — ABNORMAL LOW (ref 1.12–1.32)
Chloride: 109 mEq/L (ref 96–112)
Creatinine, Ser: 1.6 mg/dL — ABNORMAL HIGH (ref 0.4–1.5)
Glucose, Bld: 118 mg/dL — ABNORMAL HIGH (ref 70–99)
HCT: 47 % (ref 39.0–52.0)

## 2010-08-15 ENCOUNTER — Other Ambulatory Visit: Payer: Self-pay | Admitting: Internal Medicine

## 2010-08-16 NOTE — Telephone Encounter (Signed)
Called into [facility]

## 2010-08-17 ENCOUNTER — Ambulatory Visit: Payer: Managed Care, Other (non HMO) | Admitting: Vascular Surgery

## 2010-08-18 ENCOUNTER — Other Ambulatory Visit: Payer: Self-pay | Admitting: Internal Medicine

## 2010-08-18 NOTE — Telephone Encounter (Signed)
#  90 okay

## 2010-08-18 NOTE — Telephone Encounter (Signed)
Faxed to [facility]

## 2010-08-18 NOTE — Op Note (Signed)
NAME:  [name], [name]               ACCOUNT NO.:  [id number]  MEDICAL RECORD NO.:  [id number]           PATIENT TYPE:  O  LOCATION:  SDSC                         FACILITY:  [facility]  PHYSICIAN:  Juleen China IV, MDDATE OF BIRTH:  11-21-1955  DATE OF PROCEDURE:  08/10/2010 DATE OF DISCHARGE:  08/10/2010                              OPERATIVE REPORT   PREOPERATIVE DIAGNOSIS:  Right leg claudication.  POSTOPERATIVE DIAGNOSIS:  Right leg claudication.  PROCEDURES PERFORMED: 1. Ultrasound access, left femoral artery. 2. Abdominal aortogram. 3. Bilateral lower extremity runoff. 4. Second-order catheterization.  INDICATIONS:  This is a 55 year old gentleman with lifestyle-limiting claudication of the right.  He has known small infrarenal aneurysm.  He comes in today for diagnostic evaluation and possible intervention.  PROCEDURE:  The patient was identified in the holding area, taken to room #8, placed supine on table.  Both groins were prepped and draped in the usual fashion.  A time-out was called.  The left femoral artery was evaluated with ultrasound and found to be patent.  A digital ultrasound image was acquired.  The left femoral artery was accessed under ultrasound guidance with an 18 gauge needle and a 0.035 wire was advanced into the aorta under fluoroscopic visualization.  A 5-French sheath was placed over the wire and Omni flush catheter was advanced to the level of L1.  Abdominal aortogram was obtained.  Next, using the Omni flush catheter and Bentson wire, area of bifurcation was crossed. Catheter was placed in the right external iliac artery and right leg runoff was performed.  FINDINGS:  Aortogram:  Visualized portions of suprarenal abdominal aorta showed no significant disease.  There are bilateral accessory renal arteries.  There is aneurysmal degeneration of the infrarenal abdominal aorta and bilateral common iliac arteries.  Bilateral hypogastric arteries are  widely patent.  Bilateral external iliac arteries are widely patent.  Right lower extremity:  The right common femoral artery is widely patent.  The right profunda femoral artery is widely patent.  There are multiple small little degenerative disk areas within the superficial femoral artery, however, it is widely patent.  The popliteal artery occludes behind the knee.  There is reconstitution at the level of the anterior tibial artery.  The tibioperoneal trunk is patent.  The peroneal artery is patent down to the ankle.  The posterior tibial occludes in the mid calf, however, it does reconstitute at the ankle.  Left lower extremity:  The left common femoral artery is widely patent. The left profunda femoral artery is widely patent.  The superficial femoral artery is widely patent.  The left popliteal artery is widely patent.  The dominant runoff is the posterior tibial artery, however, the anterior tibial and peroneal most likely are patent.  However, they are poorly visualized due to contrast washing out.  At this point in time, the decision was made to terminate the procedure. Catheters and wires were removed.  The patient was taken to the holding for sheath pull.  IMPRESSION: 1. Aortoiliac aneurysmal degeneration. 2. Bilateral accessory renal arteries. 3. Occluded right popliteal artery with reconstitution at the anterior  tibial.  The posterior tibial occludes at the upper calf, but then     reconstitutes and at the first vessel across the ankle anterior     tibial artery appears to cross the ankles well.     Jorge Ny, MD     VWB/MEDQ  D:  08/10/2010  T:  08/10/2010  Job:  [id number]  Electronically Signed by Arelia Longest IV MD on 08/18/2010 01:28:53 PM

## 2010-08-18 NOTE — Telephone Encounter (Signed)
Please advise - refill request for pain med - last seen 06/23/10 last written 04/20/10 #90 0RF

## 2010-08-31 ENCOUNTER — Encounter (INDEPENDENT_AMBULATORY_CARE_PROVIDER_SITE_OTHER): Payer: Managed Care, Other (non HMO)

## 2010-08-31 ENCOUNTER — Ambulatory Visit (INDEPENDENT_AMBULATORY_CARE_PROVIDER_SITE_OTHER): Payer: Managed Care, Other (non HMO) | Admitting: Vascular Surgery

## 2010-08-31 DIAGNOSIS — I724 Aneurysm of artery of lower extremity: Secondary | ICD-10-CM

## 2010-08-31 DIAGNOSIS — I70219 Atherosclerosis of native arteries of extremities with intermittent claudication, unspecified extremity: Secondary | ICD-10-CM

## 2010-08-31 DIAGNOSIS — Z01810 Encounter for preprocedural cardiovascular examination: Secondary | ICD-10-CM

## 2010-09-01 NOTE — Assessment & Plan Note (Signed)
OFFICE VISIT  Bertoli, Avinash A DOB:  Oct 05, 1955                                       08/31/2010 [id number]#:[id number]  The patient presents today for continued discussion regarding his severe claudication in the right leg.  He underwent outpatient arteriogram and we are discussing this further.  The date of the arteriogram was May 29th.  This did show occlusion of his right popliteal artery, reconstitution at the junction of the anterior tibial and tibial peroneal trunk with three-vessel runoff.  He does have some irregularity but no stenosis throughout his superficial femoral artery.  Physical exam is unchanged.  Blood pressure today is 124/86, pulse 80, respirations 18.  He did undergo vein mapping today which showed patent great saphenous vein bilaterally with good size.  I again discussed observation only and he is unable to tolerate this level of claudication in his work.  I have recommend that we proceed with above-knee to below- knee popliteal bypass with saphenous vein.  I explained that if he did have inadequate artery for bypass inflow with the above-knee position that he would proceed with a full fem-pop bypass from the common femoral artery.  I think this is unlikely.  I explained 2-4 day hospitalization. He wished to proceed after July 4th on 7/9 at [facility].    Larina Earthly, M.D. Electronically Signed  TFE/MEDQ  D:  08/31/2010  T:  09/01/2010  Job:  [id number]  cc:   Gordy Savers, MD

## 2010-09-13 NOTE — Procedures (Unsigned)
VASCULAR LAB EXAM  INDICATION:  Bilateral vein mapping, greater saphenous veins; bilateral popliteal artery duplex.  HISTORY: Diabetes:  No. Cardiac:  No. Hypertension:  Yes.  EXAM:  Bilateral greater saphenous vein mapping and bilateral popliteal artery duplex.  IMPRESSION:  Bilateral patent and compressible greater saphenous veins mapped, as illustrated in the diagram.  Bilateral popliteal arteries appear nonaneurysmal with the right popliteal appearing occluded while the left appears patent with triphasic waveforms.  ___________________________________________ Larina Earthly, M.D.  OD/MEDQ  D:  08/31/2010  T:  08/31/2010  Job:  [id number]

## 2010-09-16 ENCOUNTER — Encounter (HOSPITAL_COMMUNITY)
Admission: RE | Admit: 2010-09-16 | Discharge: 2010-09-16 | Disposition: A | Discharge status: Home / Self Care | Payer: Managed Care, Other (non HMO) | Source: Ambulatory Visit | Attending: Vascular Surgery | Admitting: Vascular Surgery

## 2010-09-16 ENCOUNTER — Ambulatory Visit (HOSPITAL_COMMUNITY)
Admission: RE | Admit: 2010-09-16 | Discharge: 2010-09-16 | Disposition: A | Discharge status: Home / Self Care | Payer: Managed Care, Other (non HMO) | Source: Ambulatory Visit | Attending: Vascular Surgery | Admitting: Vascular Surgery

## 2010-09-16 ENCOUNTER — Other Ambulatory Visit: Payer: Self-pay | Admitting: Vascular Surgery

## 2010-09-16 DIAGNOSIS — Z01811 Encounter for preprocedural respiratory examination: Secondary | ICD-10-CM

## 2010-09-16 DIAGNOSIS — Z01810 Encounter for preprocedural cardiovascular examination: Secondary | ICD-10-CM | POA: Insufficient documentation

## 2010-09-16 DIAGNOSIS — Z01812 Encounter for preprocedural laboratory examination: Secondary | ICD-10-CM | POA: Insufficient documentation

## 2010-09-16 DIAGNOSIS — I10 Essential (primary) hypertension: Secondary | ICD-10-CM | POA: Insufficient documentation

## 2010-09-16 DIAGNOSIS — F17200 Nicotine dependence, unspecified, uncomplicated: Secondary | ICD-10-CM | POA: Insufficient documentation

## 2010-09-16 LAB — COMPREHENSIVE METABOLIC PANEL
ALT: 17 U/L (ref 0–53)
Albumin: 4.1 g/dL (ref 3.5–5.2)
Calcium: 9.8 mg/dL (ref 8.4–10.5)
GFR calc Af Amer: 48 mL/min — ABNORMAL LOW (ref >60)
Glucose, Bld: 120 mg/dL — ABNORMAL HIGH (ref 70–99)
Sodium: 130 mEq/L — ABNORMAL LOW (ref 135–145)
Total Protein: 7.6 g/dL (ref 6.0–8.3)

## 2010-09-16 LAB — CBC
HCT: 43.4 % (ref 39.0–52.0)
Hemoglobin: 15.2 g/dL (ref 13.0–17.0)
MCH: 31.3 pg (ref 26.0–34.0)
MCHC: 35 g/dL (ref 30.0–36.0)
MCV: 89.3 fL (ref 78.0–100.0)
RDW: 13.1 % (ref 11.5–15.5)

## 2010-09-16 LAB — TYPE AND SCREEN
ABO/RH(D): A POS
Antibody Screen: NEGATIVE

## 2010-09-16 LAB — SURGICAL PCR SCREEN: Staphylococcus aureus: NEGATIVE

## 2010-09-16 LAB — PROTIME-INR: Prothrombin Time: 13.4 seconds (ref 11.6–15.2)

## 2010-09-16 LAB — APTT: aPTT: 41 seconds — ABNORMAL HIGH (ref 24–37)

## 2010-09-20 ENCOUNTER — Inpatient Hospital Stay (HOSPITAL_COMMUNITY): Payer: Managed Care, Other (non HMO)

## 2010-09-20 ENCOUNTER — Inpatient Hospital Stay (HOSPITAL_COMMUNITY)
Admission: RE | Admit: 2010-09-20 | Discharge: 2010-09-22 | DRG: 254 | Disposition: A | Discharge status: Home / Self Care | Payer: Managed Care, Other (non HMO) | Source: Ambulatory Visit | Attending: Vascular Surgery | Admitting: Vascular Surgery

## 2010-09-20 DIAGNOSIS — I10 Essential (primary) hypertension: Secondary | ICD-10-CM | POA: Diagnosis present

## 2010-09-20 DIAGNOSIS — E785 Hyperlipidemia, unspecified: Secondary | ICD-10-CM | POA: Diagnosis present

## 2010-09-20 DIAGNOSIS — I70219 Atherosclerosis of native arteries of extremities with intermittent claudication, unspecified extremity: Secondary | ICD-10-CM

## 2010-09-20 DIAGNOSIS — Z7982 Long term (current) use of aspirin: Secondary | ICD-10-CM

## 2010-09-20 DIAGNOSIS — I714 Abdominal aortic aneurysm, without rupture, unspecified: Secondary | ICD-10-CM | POA: Diagnosis present

## 2010-09-20 DIAGNOSIS — I743 Embolism and thrombosis of arteries of the lower extremities: Secondary | ICD-10-CM

## 2010-09-20 DIAGNOSIS — F17200 Nicotine dependence, unspecified, uncomplicated: Secondary | ICD-10-CM | POA: Diagnosis present

## 2010-09-20 DIAGNOSIS — I739 Peripheral vascular disease, unspecified: Principal | ICD-10-CM | POA: Diagnosis present

## 2010-09-20 HISTORY — PX: ANGIOPLASTY: SHX39

## 2010-09-21 DIAGNOSIS — I739 Peripheral vascular disease, unspecified: Secondary | ICD-10-CM

## 2010-09-21 LAB — BASIC METABOLIC PANEL
BUN: 20 mg/dL (ref 6–23)
Calcium: 9.2 mg/dL (ref 8.4–10.5)
Chloride: 94 mEq/L — ABNORMAL LOW (ref 96–112)
Creatinine, Ser: 1.26 mg/dL (ref 0.50–1.35)
GFR calc Af Amer: >60 mL/min (ref >60)

## 2010-09-21 LAB — CBC
HCT: 36.3 % — ABNORMAL LOW (ref 39.0–52.0)
MCH: 30.5 pg (ref 26.0–34.0)
MCV: 90.8 fL (ref 78.0–100.0)
RDW: 13.5 % (ref 11.5–15.5)
WBC: 14.7 10*3/uL — ABNORMAL HIGH (ref 4.0–10.5)

## 2010-09-21 LAB — POCT I-STAT, CHEM 8
BUN: 31 mg/dL — ABNORMAL HIGH (ref 6–23)
Calcium, Ion: 1.17 mmol/L (ref 1.12–1.32)
Creatinine, Ser: 1.5 mg/dL — ABNORMAL HIGH (ref 0.50–1.35)
Hemoglobin: 16.3 g/dL (ref 13.0–17.0)
TCO2: 24 mmol/L (ref 0–100)

## 2010-09-30 NOTE — Op Note (Signed)
[name], [name]               ACCOUNT NO.:  [id number]  MEDICAL RECORD NO.:  [id number]  LOCATION:  3309                         FACILITY:  [facility]  PHYSICIAN:  Larina Earthly, M.D.    DATE OF BIRTH:  [birth year]  DATE OF PROCEDURE:  09/20/2010 DATE OF DISCHARGE:                              OPERATIVE REPORT   PREOPERATIVE DIAGNOSIS:  Limiting claudication, right leg.  POSTOPERATIVE DIAGNOSIS:  Limiting claudication, right leg.  PROCEDURE:  Right above-knee to tibioperoneal trunk bypass with vein patch angioplasty of tibioperoneal trunk and endarterectomy of tibioperoneal trunk.  SURGEON:  Larina Earthly, MD.  ASSISTANT:  [name], PA.  ANESTHESIA:  General endotracheal.  COMPLICATIONS:  None.  DISPOSITION:  To recovery room stable.  INDICATIONS FOR PROCEDURE:  The patient is a 55 year old gentleman with increasing severe right leg claudication, underwent preoperative arteriogram.  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  He did have complete occlusion of his right popliteal artery with reconstitution of the anterior tibial and tibioperoneal trunk.  He is unable to tolerate this level of claudication, and was taken to the operating room at this time for bypass.  PROCEDURE IN DETAIL:  The patient was taken to the operating room and placed in supine position where the right leg and right groin were prepped and draped in usual sterile fashion.  Incision was made over the saphenous vein at the level of below-knee position on the medial aspect of the calf and carried down to isolate the saphenous vein.  Tributary branches were ligated with 3-0 and 4-0 silk ties and divided.  The bridge of skin was left at the knee and the saphenous vein was unroofed up to the level of the above-knee position.  Incision was made over the above knee saphenous vein, again ligating and dividing  the tributary branches.  The medial approach to the above-knee popliteal artery was then accomplished.  The artery had mild ectasia, but was not aneurysmal and had an excellent pulse.  Next, through the vein harvest incision, the below-knee popliteal artery was exposed.  This was extensively calcified extending down onto the tibioperoneal trunk and anterior tibial artery.  A tunnel was created from the below-knee position to the above-knee position.  The vein was harvested with gently dilated was of moderate size.  The patient was given 8000 units of intravenous heparin. After circulation time, the above-knee popliteal artery was occluded proximally and distally with vessel loops.  The artery was opened and extended with Potts scissors.  The vein was brought onto the field, was reversed, spatulated, and sewn end-to-side to the artery with a running 6-0 Prolene suture.  The anastomosis was tested and found to be adequate.  The vein had been marked to prevent twisting, was brought down to the level of the below-knee popliteal artery.  Webril and pneumatic tourniquet was placed in the above-knee position.  The leg was elevated and exsanguinated with Esmarch tourniquet and pneumatic tourniquet was inflated to 250 mmHg.  The artery was opened with a #11 blade on the distal below-knee popliteal artery and extended onto the tibioperoneal trunk.  There was thrombus in the popliteal artery and this was removed.  The vein was cut to appropriate length, was spatulated and sewn end-to-side to the junction of the anterior tibial and tibioperoneal trunk.  After completion of the anastomosis, the usual flushing maneuvers were undertaken.  Anastomosis was completed after deflating the tourniquet.  The patient had poor augmentation with vein graft occluded or patent with Doppler at the level of foot for this reason.  Intraoperative arteriogram was obtained and this showed minimal flow through the distal  anastomosis.  For this reason, the anterior tibial artery was exposed further distally and the tibioperoneal trunk was exposed down to the takeoff of the peroneal and posterior tibial arteries.  The patient was given additional 3000 units of intravenous heparin.  The vein graft was occluded with a serrefine clamp.  The anterior tibial artery was occluded with a Potts tie, the red vessel loop, as well as the distal tibioperoneal trunk.  The vein graft was opened over its hood and Potts scissors were used to extend down to the tibioperoneal trunk.  There did appear to be a flap raised of plaque from the tibioperoneal trunk.  For this reason, the artery was opened all the way down to the takeoff of the posterior tibial and peroneal arteries.  The tibioperoneal trunk was endarterectomized as was the orifice of the anterior tibial artery.  Two dilator passed easily through both of these after the endarterectomy.  The remaining portion of saphenous vein was brought onto the field, was spatulated and sewn as a vein patch angioplasty over the tibioperoneal trunk, extending up to the level of the vein graft.  This was sewn onto the vein graft as a long patch angioplasty over the tibioperoneal trunk.  The usual flushing maneuvers were undertaken and anastomosis completed.  The clamp removed from the vein graft and excellent graft dependent flow was noted at the anterior tibial and posterior tibial arteries at the level of the foot. The patient was given 50 mg of protamine to reverse heparin.  The wound was irrigated with saline.  Hemostasis with electrocautery.  Wounds were closed with 2-0 Vicryl in the subcutaneous fascia.  Skin was closed with a 3-0 subcuticular Vicryl stitch.  Sterile dressing was applied and the patient was taken to recovery in stable condition.     Larina Earthly, M.D.     TFE/MEDQ  D:  09/20/2010  T:  09/21/2010  Job:  [id number]  Electronically Signed by TODD EARLY M.D.  on 09/30/2010 07:46:45 AM

## 2010-09-30 NOTE — Discharge Summary (Signed)
[name], [name]               ACCOUNT NO.:  [id number]  MEDICAL RECORD NO.:  [id number]  LOCATION:  2028                         FACILITY:  [facility]  PHYSICIAN:  Larina Earthly, M.D.    DATE OF BIRTH:  1955/07/14  DATE OF ADMISSION:  09/20/2010 DATE OF DISCHARGE:                              DISCHARGE SUMMARY   ADMIT DIAGNOSIS:  Right lower extremity claudication.  PAST MEDICAL HISTORY AND DISCHARGE DIAGNOSES: 1. Life-limiting right lower extremity claudication status post right     above-the-knee popliteal to tibioperoneal trunk bypass with     ipsilateral greater saphenous vein and endarterectomy of the right     tibioperoneal trunk. 2. Hypertension. 3. Hyperlipidemia. 4. Tobacco abuse. 5. A 3.7 cm infrarenal abdominal aortic aneurysm.  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  The patient also had a complete occlusion of the right popliteal artery with reconstitution of the anterior tibial and tibioperoneal trunk.  The patient was unable to tolerate his level of claudication and after evaluation by Dr. Arbie Cookey was felt to be a candidate for right lower extremity bypass.  HOSPITAL COURSE:  The patient was admitted and taken to the OR on September 20, 2010, for a right above-the-knee to tibioperoneal trunk bypass with ipsilateral greater saphenous vein as well as endarterectomy of the right tibioperoneal trunk with vein patch angioplasty.  The patient tolerated the procedure well and was hemodynamically stable immediately postoperatively.  He was transferred from the OR to the Post Anesthesia Care Unit in stable condition.  He was extubated without complication and woke up from anesthesia neurologically intact.  The patient's postoperative course  progressed as expected.  On postop day #1, he did have some mild nausea but this has resolved.  He is currently tolerating a regular diet.  His Foley was discontinued and he is voiding without difficulty.  The patient is ambulating well and has only minimal soreness in the incision.  He has remained afebrile with stable vital signs throughout the hospital course.  ABIs were performed on September 21, 2010.  This revealed a right ABI of 0.88 and a left of 1.08.  On September 22, 2010, the patient is without complaint.  He is afebrile with stable vital signs.  PHYSICAL EXAMINATION:  CARDIAC:  Regular rate and rhythm. LUNGS:  Clear to auscultation. SKIN:  Incisions are clean, dry and intact. EXTREMITIES:  There is 2+ palpable right posterior tibial pulse.  The patient is doing well at this time and is felt stable for discharge home.  LABORATORY FINDINGS:  CBC and BMP on September 21, 2010, white count 14.7, hemoglobin 12.2, hematocrit 36.3, platelets 249, sodium 130, potassium 4.3, BUN 20, creatinine 1.26.  The patient received specific written discharge instructions regarding diet, activity and wound care.  He will follow up with Dr. Arbie Cookey in 2 weeks.  The office will contact the patient with the date and time of that appointment.  DISCHARGE MEDICATIONS: 1. Vicodin 5/500 mg one to two q.4-6 h. p.r.n. pain.  2. Prilosec 20 mg 1 daily. 3. Pravachol 40 mg daily. 4. Multivitamin daily. 5. Hydrochlorothiazide 25 mg daily. 6. Benazepril 40 mg daily. 7. Aspirin 81 mg daily. 8. Alprazolam 0.5 mg p.o. nightly.     [name], PA   ______________________________ Larina Earthly, M.D.    AY/MEDQ  D:  09/22/2010  T:  09/22/2010  Job:  [id number]  Electronically Signed by [name] PA on 09/24/2010 01:26:36 PM Electronically Signed by Tawanna Cooler Demyah Smyre M.D. on 09/30/2010 07:46:47 AM

## 2010-10-07 ENCOUNTER — Encounter: Payer: Self-pay | Admitting: Vascular Surgery

## 2010-10-12 ENCOUNTER — Ambulatory Visit (INDEPENDENT_AMBULATORY_CARE_PROVIDER_SITE_OTHER): Payer: Managed Care, Other (non HMO) | Admitting: Vascular Surgery

## 2010-10-12 ENCOUNTER — Encounter: Payer: Self-pay | Admitting: Vascular Surgery

## 2010-10-12 VITALS — BP 132/83 | HR 92 | Resp 16 | Ht 72.0 in | Wt 214.0 lb

## 2010-10-12 DIAGNOSIS — I743 Embolism and thrombosis of arteries of the lower extremities: Secondary | ICD-10-CM

## 2010-10-12 NOTE — Progress Notes (Signed)
Subjective:     Patient ID: [name], male   DOB: 1956-01-17, 55 y.o.   MRN: [id number]  HPI patient is status post right above-knee femoral to tibioperoneal trunk bypass on 09/20/2010. He had poor flow through the bypass at the time of surgery and therefore underwent patch angioplasty of the tibioperoneal trunk with vein. He has the usual amount of postop soreness and pretibial numbness .   Review of Systems     Objective:   Physical Exam His above-knee and below-knee incisions are healing well. He has 2+ dorsalis pedis pulse. He has no significant swelling.    Assessment:     Doing well status post right leg bypass.    Plan:    continue mobilization. Tylox #40 for pain. Return to work on September 10. See me with postoperative vascular lab in 3 months.

## 2010-10-12 NOTE — Progress Notes (Signed)
Post op Right BPG on 09-20-2010.

## 2010-10-15 ENCOUNTER — Other Ambulatory Visit: Payer: Self-pay | Admitting: Internal Medicine

## 2010-10-22 DIAGNOSIS — I739 Peripheral vascular disease, unspecified: Secondary | ICD-10-CM

## 2010-10-24 ENCOUNTER — Other Ambulatory Visit: Payer: Self-pay | Admitting: Internal Medicine

## 2010-10-25 NOTE — Telephone Encounter (Signed)
Refill request for vicodin - last seen 06/23/10  Last written 08/18/10 #90 0RF Please advise

## 2010-10-25 NOTE — Telephone Encounter (Signed)
90

## 2011-01-02 ENCOUNTER — Other Ambulatory Visit: Payer: Self-pay | Admitting: Internal Medicine

## 2011-01-03 NOTE — Telephone Encounter (Signed)
ok 

## 2011-01-03 NOTE — Telephone Encounter (Signed)
Last seen 06/2010  Last written 10/24/10 # 90  0RF Please advise

## 2011-01-17 ENCOUNTER — Encounter: Payer: Self-pay | Admitting: Vascular Surgery

## 2011-01-18 ENCOUNTER — Encounter: Payer: Self-pay | Admitting: Vascular Surgery

## 2011-01-18 ENCOUNTER — Ambulatory Visit (INDEPENDENT_AMBULATORY_CARE_PROVIDER_SITE_OTHER): Payer: Managed Care, Other (non HMO) | Admitting: Vascular Surgery

## 2011-01-18 ENCOUNTER — Encounter (INDEPENDENT_AMBULATORY_CARE_PROVIDER_SITE_OTHER): Payer: Managed Care, Other (non HMO)

## 2011-01-18 VITALS — BP 138/82 | HR 91 | Resp 22 | Ht 72.0 in | Wt 218.0 lb

## 2011-01-18 DIAGNOSIS — I70219 Atherosclerosis of native arteries of extremities with intermittent claudication, unspecified extremity: Secondary | ICD-10-CM

## 2011-01-18 DIAGNOSIS — Z48812 Encounter for surgical aftercare following surgery on the circulatory system: Secondary | ICD-10-CM

## 2011-01-18 DIAGNOSIS — I7092 Chronic total occlusion of artery of the extremities: Secondary | ICD-10-CM

## 2011-01-18 NOTE — Progress Notes (Signed)
The patient presents today for followup of his right above-knee popliteal to tibioperoneal trunk bypass July 2012. He has a complete resolution of his calf claudication and his foot discomfort. He does have the usual amount of peri-incisional numbness and precision pretibial numbness secondary to his saphenous vein harvest. He is walking without limitation.  Past Medical History  Diagnosis Date  . COLONIC POLYPS, HX OF 05/02/2007  . GERD 11/08/2006  . HYPERCHOLESTEROLEMIA, SEVERE 05/02/2007  . HYPERTENSION 11/08/2006  . LOW BACK PAIN 11/22/2007  . 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 Claustrophobia   . AAA (abdominal aortic aneurysm) without rupture   . Claudication     lower extremities  . Arthritis     History  Substance Use Topics  . Smoking status: Current Everyday Smoker -- 0.5 packs/day for 30 years    Types: Cigarettes  . Smokeless tobacco: Never Used  . Alcohol Use: No    Family History  Problem Relation Age of Onset  . Adrenal disorder Brother   . Heart disease Brother   . Heart disease Mother   . Heart disease Father   . Pulmonary embolism Sister     Allergies  Allergen Reactions  . Cortisone   . Oxycodone     Severe itching  . Sulfonamide Derivatives     REACTION: rash    Current outpatient prescriptions:ALPRAZolam (XANAX) 0.5 MG tablet, TAKE ONE TABLET BY MOUTH TWICE DAILY AS NEEDED, Disp: 90 tablet, Rfl: 1;  amphetamine-dextroamphetamine (ADDERALL, 10MG ,) 10 MG tablet, Take 1 tablet (10 mg total) by mouth daily., Disp: 30 tablet, Rfl: 0;  aspirin 81 MG tablet, Take 81 mg by mouth daily.  , Disp: , Rfl: ;  benazepril (LOTENSIN) 40 MG tablet, TAKE ONE TABLET BY MOUTH EVERY DAY, Disp: 90 tablet, Rfl: 3 hydrochlorothiazide 25 MG tablet, TAKE ONE TABLET BY MOUTH EVERY DAY, Disp: 90 tablet, Rfl: 3;  HYDROcodone-acetaminophen (VICODIN) 5-500 MG per tablet, TAKE ONE TABLET BY MOUTH EVERY 6 HOURS AS NEEDED FOR PAIN, Disp: 90 tablet, Rfl: 0;  KLOR-CON M20 20 MEQ  tablet, TAKE ONE TABLET BY MOUTH EVERY DAY, Disp: 90 each, Rfl: 0;  Multiple Vitamin (MULTIVITAMIN) tablet, Take 1 tablet by mouth daily.  , Disp: , Rfl:  omeprazole (PRILOSEC) 20 MG capsule, Take 20 mg by mouth daily.  , Disp: , Rfl: ;  pravastatin (PRAVACHOL) 40 MG tablet, TAKE TWO TABLETS BY MOUTH EVERY DAY, Disp: 180 tablet, Rfl: 3;  VITAMIN E PO, Take by mouth.  , Disp: , Rfl:   BP 138/82  Pulse 91  Resp 22  Ht 6' (1.829 m)  Wt 218 lb (98.884 kg)  BMI 29.57 kg/m2  Body mass index is 29.57 kg/(m^2).       Physical exam: Well-developed well-nourished white male in no acute distress. His above-knee and below-knee popliteal incisions are completely healed he has palpable popliteal pulse and has palpable dorsalis pedis and posterior tibial pulse.  musculoskeletal shows no deformities. Neurologic grossly intact.  Vascular lab: Patent right above-knee to tibioperoneal trunk bypass with normal velocities throughout the graft. Normal ankle arm index bilaterally.  Impression and plan: Normal expected recovery following right leg bypass. He will continue activities without limitation. He'll be seen again in 3 months with repeat vascular lab evaluation.

## 2011-01-26 NOTE — Procedures (Unsigned)
BYPASS GRAFT EVALUATION  INDICATION:  Peripheral arterial disease with claudication.  HISTORY: Diabetes:  No. Cardiac:  No. Hypertension:  Yes. Smoking:  Yes. Previous Surgery:  09/20/10:  Right above-knee to tibioperoneal trunk bypass graft with vein patch angioplasty of the tibioperoneal trunk and endarterectomy of the tibioperoneal trunk.  SINGLE LEVEL ARTERIAL EXAM                              RIGHT              LEFT Brachial: Anterior tibial: Posterior tibial: Peroneal: Ankle/brachial index:        1.03               1.10  PREVIOUS ABI:  Date: 06/25/10 (performed at [facility]) RIGHT: 0.50  LEFT:  1.0   LOWER EXTREMITY BYPASS GRAFT DUPLEX EXAM:  DUPLEX:  Mixed plaque is noted in the right common femoral artery.  Soft plaque is present throughout the superficial femoral artery in the proximal to mid thigh.  The right lower extremity bypass graft is patent.  All velocities are within normal limits.  IMPRESSION:  Patent right above-knee to tibioperoneal trunk bypass graft with velocities within normal limits.        ___________________________________________ Larina Earthly, M.D.  CI/MEDQ  D:  01/18/2011  T:  01/18/2011  Job:  [id number]

## 2011-02-13 ENCOUNTER — Other Ambulatory Visit: Payer: Self-pay | Admitting: Internal Medicine

## 2011-04-25 ENCOUNTER — Encounter: Payer: Self-pay | Admitting: Vascular Surgery

## 2011-04-26 ENCOUNTER — Encounter: Payer: Self-pay | Admitting: Vascular Surgery

## 2011-04-26 ENCOUNTER — Ambulatory Visit (INDEPENDENT_AMBULATORY_CARE_PROVIDER_SITE_OTHER): Payer: Managed Care, Other (non HMO) | Admitting: Vascular Surgery

## 2011-04-26 ENCOUNTER — Encounter (INDEPENDENT_AMBULATORY_CARE_PROVIDER_SITE_OTHER): Payer: Managed Care, Other (non HMO) | Admitting: *Deleted

## 2011-04-26 VITALS — BP 124/70 | HR 84 | Resp 16 | Ht 72.0 in | Wt 218.0 lb

## 2011-04-26 DIAGNOSIS — I739 Peripheral vascular disease, unspecified: Secondary | ICD-10-CM | POA: Insufficient documentation

## 2011-04-26 DIAGNOSIS — Z48812 Encounter for surgical aftercare following surgery on the circulatory system: Secondary | ICD-10-CM

## 2011-04-26 NOTE — Progress Notes (Signed)
The patient presents today for followup of his right above-knee to tibioperoneal trunk bypass in July 2012. He continues to do well and has no claudication symptoms. He's had no tissue loss. He does have some peripheral neuropathy.  Past Medical History  Diagnosis Date  . COLONIC POLYPS, HX OF 05/02/2007  . GERD 11/08/2006  . HYPERCHOLESTEROLEMIA, SEVERE 05/02/2007  . HYPERTENSION 11/08/2006  . LOW BACK PAIN 11/22/2007  . 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 Claustrophobia   . AAA (abdominal aortic aneurysm) without rupture   . Claudication     lower extremities  . Arthritis   . DVT (deep venous thrombosis)     History  Substance Use Topics  . Smoking status: Current Everyday Smoker -- 0.5 packs/day for 30 years    Types: Cigarettes  . Smokeless tobacco: Never Used  . Alcohol Use: No    Family History  Problem Relation Age of Onset  . Adrenal disorder Brother   . Hyperlipidemia Brother   . Hypertension Brother   . Heart disease Brother     Heart Disease before age 36  . Heart attack Brother   . Heart disease Mother     Heart Disease before age 40  . Hyperlipidemia Mother   . Hypertension Mother   . Heart attack Mother   . Heart disease Father     Heart Disease before age 30  . Hyperlipidemia Father   . Heart attack Father   . Pulmonary embolism Sister     Allergies  Allergen Reactions  . Cortisone   . Oxycodone     Severe itching  . Sulfonamide Derivatives     REACTION: rash    Current outpatient prescriptions:ALPRAZolam (XANAX) 0.5 MG tablet, TAKE ONE TABLET BY MOUTH TWICE DAILY AS NEEDED, Disp: 90 tablet, Rfl: 0;  amphetamine-dextroamphetamine (ADDERALL, 10MG ,) 10 MG tablet, Take 1 tablet (10 mg total) by mouth daily., Disp: 30 tablet, Rfl: 0;  aspirin 81 MG tablet, Take 81 mg by mouth daily.  , Disp: , Rfl: ;  benazepril (LOTENSIN) 40 MG tablet, TAKE ONE TABLET BY MOUTH EVERY DAY, Disp: 90 tablet, Rfl: 3 hydrochlorothiazide 25 MG tablet, TAKE ONE TABLET  BY MOUTH EVERY DAY, Disp: 90 tablet, Rfl: 3;  HYDROcodone-acetaminophen (VICODIN) 5-500 MG per tablet, TAKE ONE TABLET BY MOUTH EVERY 6 HOURS AS NEEDED FOR PAIN, Disp: 90 tablet, Rfl: 0;  KLOR-CON M20 20 MEQ tablet, TAKE ONE TABLET BY MOUTH EVERY DAY, Disp: 90 each, Rfl: 0;  Multiple Vitamin (MULTIVITAMIN) tablet, Take 1 tablet by mouth daily.  , Disp: , Rfl:  omeprazole (PRILOSEC) 20 MG capsule, Take 20 mg by mouth daily.  , Disp: , Rfl: ;  pravastatin (PRAVACHOL) 40 MG tablet, TAKE TWO TABLETS BY MOUTH EVERY DAY, Disp: 180 tablet, Rfl: 3;  VITAMIN E PO, Take by mouth.  , Disp: , Rfl:   BP 124/70  Pulse 84  Resp 16  Ht 6' (1.829 m)  Wt 218 lb (98.884 kg)  BMI 29.57 kg/m2  SpO2 95%  Body mass index is 29.57 kg/(m^2).       Review of systems: No change  Physical exam: well-developed well-nourished white male in no acute distress. His femoral and popliteal pulses are 2+ bilaterally. He has 2+ posterior tibial pulses bilaterally. His above-knee and below-knee incisions are well-healed.  Noninvasive vascular lab: Normal ankle arm index in biphasic waveforms bilaterally. Patent graft by duplex ultrasound. Moderately elevated velocities in the midportion of the bypass.  Impression and plan: Stable status  post above-knee to below-knee right popliteal bypass. Continue walking program. He will see [place] at 6 months with repeat duplex surveillance of his graft

## 2011-05-02 ENCOUNTER — Other Ambulatory Visit: Payer: Self-pay | Admitting: Internal Medicine

## 2011-05-04 NOTE — Procedures (Unsigned)
BYPASS GRAFT EVALUATION  INDICATION:  Followup bypass graft.  HISTORY: Diabetes:  No Cardiac:  No Hypertension:  Yes Smoking:  Yes Previous Surgery:  Right above-knee to tibioperoneal trunk bypass graft with vein patch angioplasty and endarterectomy of the tibioperoneal trunk 09/20/2010  SINGLE LEVEL ARTERIAL EXAM                              RIGHT              LEFT Brachial:                    130                129 Anterior tibial:             118                118 Posterior tibial:            122                124 Peroneal: Ankle/brachial index:        0.94               0.95  PREVIOUS ABI:  Date: 01/18/2011  RIGHT:  1.03  LEFT:  1.10  LOWER EXTREMITY BYPASS GRAFT DUPLEX EXAM:  DUPLEX:  Patent right distal SFA to tibioperoneal trunk bypass graft with an elevated velocity of 248 cm/sec noted through the mid segment.  IMPRESSION: 1. Bilateral ankle-brachial indices are suggestive of mild arterial     disease and have declined slightly in comparison to the previous     exam. 2. Patent right bypass graft with an elevated velocity in the mid     segment, as mentioned above.  ___________________________________________ Larina Earthly, M.D.  EM/MEDQ  D:  04/27/2011  T:  04/27/2011  Job:  [id number]

## 2011-07-17 ENCOUNTER — Other Ambulatory Visit: Payer: Self-pay | Admitting: Internal Medicine

## 2011-07-20 ENCOUNTER — Other Ambulatory Visit: Payer: Self-pay | Admitting: Internal Medicine

## 2011-08-13 ENCOUNTER — Other Ambulatory Visit: Payer: Self-pay | Admitting: Internal Medicine

## 2011-08-15 NOTE — Telephone Encounter (Signed)
Please advise - last seen 05/02/11 3 mos rov  Last 01-02-11 # 90  0RF  For vicodin Last  05/02/11 # 90 0RF for alprazolam

## 2011-08-15 NOTE — Telephone Encounter (Signed)
Called into [facility]

## 2011-08-15 NOTE — Telephone Encounter (Signed)
ok 

## 2011-11-07 ENCOUNTER — Other Ambulatory Visit: Payer: Self-pay | Admitting: *Deleted

## 2011-11-07 DIAGNOSIS — I739 Peripheral vascular disease, unspecified: Secondary | ICD-10-CM

## 2011-11-07 DIAGNOSIS — Z48812 Encounter for surgical aftercare following surgery on the circulatory system: Secondary | ICD-10-CM

## 2011-11-11 ENCOUNTER — Encounter: Payer: Self-pay | Admitting: Neurosurgery

## 2011-11-15 ENCOUNTER — Encounter (INDEPENDENT_AMBULATORY_CARE_PROVIDER_SITE_OTHER): Payer: Managed Care, Other (non HMO) | Admitting: *Deleted

## 2011-11-15 ENCOUNTER — Ambulatory Visit (INDEPENDENT_AMBULATORY_CARE_PROVIDER_SITE_OTHER): Payer: Managed Care, Other (non HMO) | Admitting: *Deleted

## 2011-11-15 ENCOUNTER — Ambulatory Visit (INDEPENDENT_AMBULATORY_CARE_PROVIDER_SITE_OTHER): Payer: Managed Care, Other (non HMO) | Admitting: Neurosurgery

## 2011-11-15 ENCOUNTER — Encounter: Payer: Self-pay | Admitting: Neurosurgery

## 2011-11-15 VITALS — BP 123/80 | HR 75 | Resp 16 | Ht 72.0 in | Wt 207.4 lb

## 2011-11-15 DIAGNOSIS — Z48812 Encounter for surgical aftercare following surgery on the circulatory system: Secondary | ICD-10-CM

## 2011-11-15 DIAGNOSIS — I739 Peripheral vascular disease, unspecified: Secondary | ICD-10-CM

## 2011-11-15 NOTE — Addendum Note (Signed)
Addended by: Sharee Pimple on: 11/15/2011 01:09 PM   Modules accepted: Orders

## 2011-11-15 NOTE — Progress Notes (Signed)
[facility] PAD/PVD Office Note  CC: Six-month surveillance PVD Referring Physician: Early  History of Present Illness: 56 year old male patient of Dr. Arbie Cookey who is status post a right popliteal to tibioperoneal peroneal trunk bypass in July 2012. The patient denies any claudication, rest pain or open ulcerations. The patient does have some medial right ankle pain of unknown etiology.  Past Medical History  Diagnosis Date  . COLONIC POLYPS, HX OF 05/02/2007  . GERD 11/08/2006  . HYPERCHOLESTEROLEMIA, SEVERE 05/02/2007  . HYPERTENSION 11/08/2006  . LOW BACK PAIN 11/22/2007  . 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 Claustrophobia   . AAA (abdominal aortic aneurysm) without rupture   . Claudication     lower extremities  . Arthritis   . DVT (deep venous thrombosis)     ROS: [x]  Positive   [ ]  Denies    General: [ ]  Weight loss, [ ]  Fever, [ ]  chills Neurologic: [ ]  Dizziness, [ ]  Blackouts, [ ]  Seizure [ ]  Stroke, [ ]  "Mini stroke", [ ]  Slurred speech, [ ]  Temporary blindness; [ ]  weakness in arms or legs, [ ]  Hoarseness Cardiac: [ ]  Chest pain/pressure, [ ]  Shortness of breath at rest [ ]  Shortness of breath with exertion, [ ]  Atrial fibrillation or irregular heartbeat Vascular: [ ]  Pain in legs with walking, [ ]  Pain in legs at rest, [ ]  Pain in legs at night,  [ ]  Non-healing ulcer, [ ]  Blood clot in vein/DVT,   Pulmonary: [ ]  Home oxygen, [ ]  Productive cough, [ ]  Coughing up blood, [ ]  Asthma,  [ ]  Wheezing Musculoskeletal:  [ ]  Arthritis, [ ]  Low back pain, [ ]  Joint pain Hematologic: [ ]  Easy Bruising, [ ]  Anemia; [ ]  Hepatitis Gastrointestinal: [ ]  Blood in stool, [ ]  Gastroesophageal Reflux/heartburn, [ ]  Trouble swallowing Urinary: [ ]  chronic Kidney disease, [ ]  on HD - [ ]  MWF or [ ]  TTHS, [ ]  Burning with urination, [ ]  Difficulty urinating Skin: [ ]  Rashes, [ ]  Wounds Psychological: [ ]  Anxiety, [ ]  Depression   Social  History History  Substance Use Topics  . Smoking status: Current Everyday Smoker -- 0.5 packs/day for 30 years    Types: Cigarettes  . Smokeless tobacco: Never Used  . Alcohol Use: No    Family History Family History  Problem Relation Age of Onset  . Adrenal disorder Brother   . Hyperlipidemia Brother   . Hypertension Brother   . Heart disease Brother     Heart Disease before age 21  . Heart attack Brother   . Heart disease Mother     Heart Disease before age 54  . Hyperlipidemia Mother   . Hypertension Mother   . Heart attack Mother   . Heart disease Father     Heart Disease before age 3  . Hyperlipidemia Father   . Heart attack Father   . Pulmonary embolism Sister     Allergies  Allergen Reactions  . Cortisone   . Oxycodone     Severe itching  . Sulfonamide Derivatives     REACTION: rash    Current Outpatient Prescriptions  Medication Sig Dispense Refill  . ALPRAZolam (XANAX) 0.5 MG tablet TAKE ONE TABLET BY MOUTH TWICE DAILY AS NEEDED  90 tablet  0  . amphetamine-dextroamphetamine (ADDERALL) 10 MG tablet Take 10 mg by mouth daily.      Marland Kitchen aspirin 81 MG tablet Take 81 mg by mouth daily.        Marland Kitchen  benazepril (LOTENSIN) 40 MG tablet TAKE ONE TABLET BY MOUTH EVERY DAY  90 tablet  3  . hydrochlorothiazide 25 MG tablet TAKE ONE TABLET BY MOUTH EVERY DAY  90 tablet  3  . HYDROcodone-acetaminophen (VICODIN) 5-500 MG per tablet TAKE ONE TABLET BY MOUTH EVERY 6 HOURS AS NEEDED FOR PAIN  90 tablet  0  . KLOR-CON M20 20 MEQ tablet TAKE ONE TABLET BY MOUTH EVERY DAY  90 each  0  . Multiple Vitamin (MULTIVITAMIN) tablet Take 1 tablet by mouth daily.        Marland Kitchen omeprazole (PRILOSEC) 20 MG capsule Take 20 mg by mouth daily.        . pravastatin (PRAVACHOL) 40 MG tablet TAKE TWO TABLETS BY MOUTH EVERY DAY  90 tablet  3  . VITAMIN E PO Take by mouth.        . DISCONTD: amphetamine-dextroamphetamine (ADDERALL, 10MG ,) 10 MG tablet Take 1 tablet (10 mg total) by mouth daily.  30 tablet   0    Physical Examination  Filed Vitals:   11/15/11 1148  BP: 123/80  Pulse: 75  Resp: 16    Body mass index is 28.13 kg/(m^2).  General:  WDWN in NAD Gait: Normal HEENT: WNL Eyes: Pupils equal Pulmonary: normal non-labored breathing , without Rales, rhonchi,  wheezing Cardiac: RRR, without  Murmurs, rubs or gallops; No carotid bruits Abdomen: soft, NT, no masses Skin: no rashes, ulcers noted Vascular Exam/Pulses: Palpable lower extremity pulses bilaterally  Extremities without ischemic changes, no Gangrene , no cellulitis; no open wounds;  Musculoskeletal: no muscle wasting or atrophy  Neurologic: A&O X 3; Appropriate Affect ; SENSATION: normal; MOTOR FUNCTION:  moving all extremities equally. Speech is fluent/normal  Non-Invasive Vascular Imaging: Pass graft duplex shows a patent right graft with normal ABIs, right ABI is 1.33 and triphasic, left ABI is 1.09 and triphasic.  ASSESSMENT/PLAN: Asymptomatic patient status post right above-the-knee popliteal to tibial peroneal trunk bypass July 2012. The patient will followup in 9 months with repeat duplex and ABIs, his questions were encouraged and answered, he is in agreement with this plan  Lauree Chandler ANP  M.D.: Early

## 2011-11-18 ENCOUNTER — Other Ambulatory Visit: Payer: Self-pay | Admitting: Internal Medicine

## 2011-11-22 ENCOUNTER — Other Ambulatory Visit: Payer: Self-pay | Admitting: Internal Medicine

## 2011-11-22 NOTE — Telephone Encounter (Signed)
ok 

## 2011-11-22 NOTE — Telephone Encounter (Signed)
Rx last filled on 08/13/11 #90 take 1 tablet twice daily.  Pt last seen 06/23/11. Pls advise.

## 2011-11-23 NOTE — Telephone Encounter (Signed)
Rx called in to pharmacy. 

## 2011-12-06 ENCOUNTER — Other Ambulatory Visit: Payer: Self-pay | Admitting: Internal Medicine

## 2011-12-23 ENCOUNTER — Ambulatory Visit (INDEPENDENT_AMBULATORY_CARE_PROVIDER_SITE_OTHER): Payer: Managed Care, Other (non HMO) | Admitting: Internal Medicine

## 2011-12-23 DIAGNOSIS — Z23 Encounter for immunization: Secondary | ICD-10-CM

## 2011-12-23 MED ORDER — HYDROCHLOROTHIAZIDE 25 MG PO TABS: 25.0000 mg | ORAL_TABLET | Freq: Every day | ORAL | Status: DC

## 2011-12-23 MED ORDER — BENAZEPRIL HCL 40 MG PO TABS: 40.0000 mg | ORAL_TABLET | Freq: Every day | ORAL | Status: DC

## 2012-02-27 ENCOUNTER — Other Ambulatory Visit: Payer: Self-pay | Admitting: Internal Medicine

## 2012-02-29 ENCOUNTER — Other Ambulatory Visit: Payer: Self-pay | Admitting: Internal Medicine

## 2012-05-22 ENCOUNTER — Ambulatory Visit (INDEPENDENT_AMBULATORY_CARE_PROVIDER_SITE_OTHER): Payer: Managed Care, Other (non HMO) | Admitting: Internal Medicine

## 2012-05-22 ENCOUNTER — Encounter: Payer: Self-pay | Admitting: Internal Medicine

## 2012-05-22 ENCOUNTER — Ambulatory Visit (HOSPITAL_COMMUNITY)
Admission: RE | Admit: 2012-05-22 | Discharge: 2012-05-22 | Disposition: A | Discharge status: Home / Self Care | Payer: Managed Care, Other (non HMO) | Source: Ambulatory Visit | Attending: Internal Medicine | Admitting: Internal Medicine

## 2012-05-22 VITALS — BP 120/78 | HR 94 | Temp 97.8°F | Resp 18 | Wt 220.0 lb

## 2012-05-22 DIAGNOSIS — E7800 Pure hypercholesterolemia, unspecified: Secondary | ICD-10-CM

## 2012-05-22 DIAGNOSIS — I10 Essential (primary) hypertension: Secondary | ICD-10-CM

## 2012-05-22 DIAGNOSIS — N289 Disorder of kidney and ureter, unspecified: Secondary | ICD-10-CM | POA: Insufficient documentation

## 2012-05-22 DIAGNOSIS — N400 Enlarged prostate without lower urinary tract symptoms: Secondary | ICD-10-CM | POA: Insufficient documentation

## 2012-05-22 DIAGNOSIS — K7689 Other specified diseases of liver: Secondary | ICD-10-CM | POA: Insufficient documentation

## 2012-05-22 DIAGNOSIS — K8020 Calculus of gallbladder without cholecystitis without obstruction: Secondary | ICD-10-CM | POA: Insufficient documentation

## 2012-05-22 DIAGNOSIS — R109 Unspecified abdominal pain: Secondary | ICD-10-CM | POA: Insufficient documentation

## 2012-05-22 DIAGNOSIS — I714 Abdominal aortic aneurysm, without rupture, unspecified: Secondary | ICD-10-CM | POA: Insufficient documentation

## 2012-05-22 DIAGNOSIS — I739 Peripheral vascular disease, unspecified: Secondary | ICD-10-CM

## 2012-05-22 DIAGNOSIS — Z86010 Personal history of colonic polyps: Secondary | ICD-10-CM

## 2012-05-22 DIAGNOSIS — R1031 Right lower quadrant pain: Secondary | ICD-10-CM | POA: Insufficient documentation

## 2012-05-22 MED ORDER — ALPRAZOLAM 0.5 MG PO TABS: 0.5000 mg | ORAL_TABLET | Freq: Two times a day (BID) | ORAL | Status: DC

## 2012-05-22 MED ORDER — IOHEXOL 300 MG/ML  SOLN
100.0000 mL | Freq: Once | INTRAMUSCULAR | Status: AC | PRN
  Administered 2012-05-22: 100 mL via INTRAVENOUS

## 2012-05-22 MED ORDER — IOHEXOL 300 MG/ML  SOLN
50.0000 mL | Freq: Once | INTRAMUSCULAR | Status: AC | PRN
  Administered 2012-05-22: 50 mL via ORAL

## 2012-05-22 NOTE — Progress Notes (Signed)
Subjective:    Patient ID: [name], male    DOB: Jul 18, 1955, 57 y.o.   MRN: [id number]  HPI  57 year old patient whose medical problems include hypertension dyslipidemia and ongoing tobacco use. He has a history of peripheral vascular disease and noted to have a 3.7 cm aneurysm in February 2011 noted on abdominal MRI. He is status post right femoropopliteal bypass in July of 2012. He presents today with a chief complaint of right-sided abdominal pain that began 5 days ago pain is described as right-sided and aggravated by movement and coughing. Associated symptoms include lightheadedness fatigue and poor appetite. Denies any nausea vomiting or change in his bowel habits. His last meal was lunch today. Denies any fever  Past Medical History  Diagnosis Date  . COLONIC POLYPS, HX OF 05/02/2007  . GERD 11/08/2006  . HYPERCHOLESTEROLEMIA, SEVERE 05/02/2007  . HYPERTENSION 11/08/2006  . LOW BACK PAIN 11/22/2007  . 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 Claustrophobia   . AAA (abdominal aortic aneurysm) without rupture   . Claudication     lower extremities  . Arthritis   . DVT (deep venous thrombosis)     History   Social History  . Marital Status: Married    Spouse Name: N/A    Number of Children: N/A  . Years of Education: N/A   Occupational History  . Not on file.   Social History Main Topics  . Smoking status: Current Every Day Smoker -- 0.50 packs/day for 30 years    Types: Cigarettes  . Smokeless tobacco: Never Used  . Alcohol Use: No  . Drug Use: No  . Sexually Active: Not on file   Other Topics Concern  . Not on file   Social History Narrative  . No narrative on file    Past Surgical History  Procedure Laterality Date  . Mohs surgery      lower lip  . Foot surgery      right x2  . Angioplasty  09/20/10    Rt AK to tibioperoneal trunk BPG w/ vein patch angioplasty of tibioperoneal trunk   . Carpel tunnel surgery  per pt. 6-7 years ago    bilateral   (done 2 weeks apart)  . Pr vein bypass graft,aorto-fem-pop      Family History  Problem Relation Age of Onset  . Adrenal disorder Brother   . Hyperlipidemia Brother   . Hypertension Brother   . Heart disease Brother     Heart Disease before age 55  . Heart attack Brother   . Heart disease Mother     Heart Disease before age 66  . Hyperlipidemia Mother   . Hypertension Mother   . Heart attack Mother   . Heart disease Father     Heart Disease before age 1  . Hyperlipidemia Father   . Heart attack Father   . Pulmonary embolism Sister     Allergies  Allergen Reactions  . Cortisone   . Oxycodone     Severe itching  . Sulfonamide Derivatives     REACTION: rash    Current Outpatient Prescriptions on File Prior to Visit  Medication Sig Dispense Refill  . aspirin 81 MG tablet Take 81 mg by mouth daily.        . benazepril (LOTENSIN) 40 MG tablet Take 1 tablet (40 mg total) by mouth daily.  90 tablet  3  . hydrochlorothiazide (HYDRODIURIL) 25 MG tablet Take 1 tablet (25 mg total) by mouth daily.  90 tablet  3  . HYDROcodone-acetaminophen (VICODIN) 5-500 MG per tablet TAKE ONE TABLET BY MOUTH EVERY 6 HOURS AS NEEDED FOR PAIN  90 tablet  0  . KLOR-CON M20 20 MEQ tablet TAKE ONE TABLET BY MOUTH EVERY DAY  90 each  0  . Multiple Vitamin (MULTIVITAMIN) tablet Take 1 tablet by mouth daily.        Marland Kitchen omeprazole (PRILOSEC) 20 MG capsule Take 20 mg by mouth daily.        . pravastatin (PRAVACHOL) 40 MG tablet TAKE TWO TABLETS BY MOUTH EVERY DAY  90 tablet  3  . VITAMIN E PO Take by mouth.         No current facility-administered medications on file prior to visit.    BP 120/78  Pulse 94  Temp(Src) 97.8 F (36.6 C) (Oral)  Resp 18  Wt 220 lb (99.791 kg)  BMI 29.83 kg/m2  SpO2 97%      Review of Systems  Constitutional: Positive for fatigue. Negative for fever, chills and appetite change.  HENT: Negative for hearing loss, ear pain, congestion, sore throat, trouble  swallowing, neck stiffness, dental problem, voice change and tinnitus.   Eyes: Negative for pain, discharge and visual disturbance.  Respiratory: Negative for cough, chest tightness, wheezing and stridor.   Cardiovascular: Negative for chest pain, palpitations and leg swelling.  Gastrointestinal: Positive for abdominal pain. Negative for nausea, vomiting, diarrhea, constipation, blood in stool and abdominal distention.  Genitourinary: Negative for urgency, hematuria, flank pain, discharge, difficulty urinating and genital sores.  Musculoskeletal: Negative for myalgias, back pain, joint swelling, arthralgias and gait problem.  Skin: Negative for rash.  Neurological: Positive for light-headedness. Negative for dizziness, syncope, speech difficulty, weakness, numbness and headaches.  Hematological: Negative for adenopathy. Does not bruise/bleed easily.  Psychiatric/Behavioral: Negative for behavioral problems and dysphoric mood. The patient is not nervous/anxious.        Objective:   Physical Exam  Constitutional: He is oriented to person, place, and time. He appears well-developed and well-nourished. No distress.  HENT:  Head: Normocephalic.  Right Ear: External ear normal.  Left Ear: External ear normal.  Eyes: Conjunctivae and EOM are normal.  Neck: Normal range of motion.  Cardiovascular: Normal rate and normal heart sounds.   Pulmonary/Chest: Breath sounds normal.  Abdominal: Soft. Bowel sounds are normal. He exhibits no mass. There is tenderness. There is rebound. There is no guarding.  Describes abdominal pain as right-sided but otherwise patient does not localize the pain well. He states it is most severe in the right upper quadrant but is also quite tender in the right lower quadrant. Mild rebound tenderness is noted. Bowel sounds are normal.    Musculoskeletal: Normal range of motion. He exhibits no edema and no tenderness.  Neurological: He is alert and oriented to person, place,  and time.  Psychiatric: He has a normal mood and affect. His behavior is normal.          Assessment & Plan:   R sided abdominal pain of 5 days duration.   Will check lab and Abdominal CT with oral contrast  HTN  HLD PAD   Needs CPX

## 2012-05-22 NOTE — Patient Instructions (Addendum)
CT abdominal scan today as discussed  Evaluate ED if clinical worsening

## 2012-05-23 LAB — CBC WITH DIFFERENTIAL/PLATELET
Basophils Relative: 0.8 % (ref 0.0–3.0)
HCT: 44 % (ref 39.0–52.0)
Hemoglobin: 15.3 g/dL (ref 13.0–17.0)
Lymphocytes Relative: 25.5 % (ref 12.0–46.0)
MCHC: 34.7 g/dL (ref 30.0–36.0)
Monocytes Relative: 10.4 % (ref 3.0–12.0)
Neutro Abs: 5.6 10*3/uL (ref 1.4–7.7)
RBC: 4.72 Mil/uL (ref 4.22–5.81)

## 2012-05-23 LAB — COMPREHENSIVE METABOLIC PANEL
AST: 30 U/L (ref 0–37)
BUN: 25 mg/dL — ABNORMAL HIGH (ref 6–23)
Calcium: 9.4 mg/dL (ref 8.4–10.5)
Chloride: 98 mEq/L (ref 96–112)
Creatinine, Ser: 1.6 mg/dL — ABNORMAL HIGH (ref 0.4–1.5)
GFR: 47.94 mL/min — ABNORMAL LOW (ref >60.00)
Total Bilirubin: 0.7 mg/dL (ref 0.3–1.2)

## 2012-05-26 ENCOUNTER — Other Ambulatory Visit: Payer: Self-pay | Admitting: *Deleted

## 2012-05-26 MED ORDER — PRAVASTATIN SODIUM 40 MG PO TABS: 40.0000 mg | ORAL_TABLET | Freq: Every day | ORAL | Status: DC

## 2012-07-31 ENCOUNTER — Other Ambulatory Visit: Payer: Self-pay | Admitting: Internal Medicine

## 2012-08-14 ENCOUNTER — Ambulatory Visit: Payer: Managed Care, Other (non HMO) | Admitting: Neurosurgery

## 2012-08-14 ENCOUNTER — Encounter (INDEPENDENT_AMBULATORY_CARE_PROVIDER_SITE_OTHER): Payer: Managed Care, Other (non HMO) | Admitting: *Deleted

## 2012-08-14 DIAGNOSIS — Z48812 Encounter for surgical aftercare following surgery on the circulatory system: Secondary | ICD-10-CM

## 2012-08-14 DIAGNOSIS — I739 Peripheral vascular disease, unspecified: Secondary | ICD-10-CM

## 2012-08-30 ENCOUNTER — Other Ambulatory Visit: Payer: Self-pay

## 2012-08-30 DIAGNOSIS — Z48812 Encounter for surgical aftercare following surgery on the circulatory system: Secondary | ICD-10-CM

## 2012-08-30 DIAGNOSIS — I739 Peripheral vascular disease, unspecified: Secondary | ICD-10-CM

## 2012-08-31 ENCOUNTER — Encounter: Payer: Self-pay | Admitting: Vascular Surgery

## 2012-10-27 ENCOUNTER — Other Ambulatory Visit: Payer: Self-pay | Admitting: Internal Medicine

## 2012-11-26 ENCOUNTER — Other Ambulatory Visit: Payer: Self-pay | Admitting: Internal Medicine

## 2012-12-22 ENCOUNTER — Other Ambulatory Visit: Payer: Self-pay | Admitting: Internal Medicine

## 2012-12-30 ENCOUNTER — Other Ambulatory Visit: Payer: Self-pay | Admitting: Internal Medicine

## 2013-02-19 ENCOUNTER — Other Ambulatory Visit: Payer: Self-pay | Admitting: Internal Medicine

## 2013-02-19 ENCOUNTER — Encounter: Payer: Self-pay | Admitting: Internal Medicine

## 2013-02-19 ENCOUNTER — Ambulatory Visit (INDEPENDENT_AMBULATORY_CARE_PROVIDER_SITE_OTHER): Payer: Managed Care, Other (non HMO) | Admitting: Internal Medicine

## 2013-02-19 VITALS — BP 148/90 | HR 92 | Temp 97.8°F | Resp 20 | Wt 212.0 lb

## 2013-02-19 DIAGNOSIS — E7800 Pure hypercholesterolemia, unspecified: Secondary | ICD-10-CM

## 2013-02-19 DIAGNOSIS — I10 Essential (primary) hypertension: Secondary | ICD-10-CM

## 2013-02-19 DIAGNOSIS — I739 Peripheral vascular disease, unspecified: Secondary | ICD-10-CM

## 2013-02-19 MED ORDER — POTASSIUM CHLORIDE CRYS ER 20 MEQ PO TBCR: EXTENDED_RELEASE_TABLET | ORAL | Status: DC

## 2013-02-19 MED ORDER — PRAVASTATIN SODIUM 40 MG PO TABS: ORAL_TABLET | ORAL | Status: DC

## 2013-02-19 MED ORDER — ALPRAZOLAM 0.5 MG PO TABS: 0.5000 mg | ORAL_TABLET | Freq: Two times a day (BID) | ORAL | Status: DC

## 2013-02-19 MED ORDER — BENAZEPRIL HCL 40 MG PO TABS: ORAL_TABLET | ORAL | Status: DC

## 2013-02-19 MED ORDER — HYDROCHLOROTHIAZIDE 25 MG PO TABS: 25.0000 mg | ORAL_TABLET | Freq: Every day | ORAL | Status: DC

## 2013-02-19 NOTE — Patient Instructions (Signed)
Limit your sodium (Salt) intake    It is important that you exercise regularly, at least 20 minutes 3 to 4 times per week.  If you develop chest pain or shortness of breath seek  medical attention.  Smoking tobacco is very bad for your health. You should stop smoking immediately.  Please check your blood pressure on a regular basis.  If it is consistently greater than 150/90, please make an office appointment.  Return in 6 months for follow-up  

## 2013-02-19 NOTE — Progress Notes (Signed)
Subjective:    Patient ID: [name], male    DOB: [birth year], 57 y.o.   MRN: [id number]  HPI Pre-visit discussion using our clinic review tool. No additional management support is needed unless otherwise documented below in the visit note.  57 year old patient who presents with a one-week history of cough chest congestion and sore throat he has some significant hoarseness and anorexia which have improved. There's been no fever. He has treated hypertension dyslipidemia CAD and ongoing tobacco use. He is scheduled for vascular surgery followup next week. He continues to have some right leg claudication  Past Medical History  Diagnosis Date  . COLONIC POLYPS, HX OF 05/02/2007  . GERD 11/08/2006  . HYPERCHOLESTEROLEMIA, SEVERE 05/02/2007  . HYPERTENSION 11/08/2006  . LOW BACK PAIN 11/22/2007  . 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 Claustrophobia   . AAA (abdominal aortic aneurysm) without rupture   . Claudication     lower extremities  . Arthritis   . DVT (deep venous thrombosis)     History   Social History  . Marital Status: Married    Spouse Name: N/A    Number of Children: N/A  . Years of Education: N/A   Occupational History  . Not on file.   Social History Main Topics  . Smoking status: Current Every Day Smoker -- 0.50 packs/day for 30 years    Types: Cigarettes  . Smokeless tobacco: Never Used  . Alcohol Use: No  . Drug Use: No  . Sexual Activity: Not on file   Other Topics Concern  . Not on file   Social History Narrative  . No narrative on file    Past Surgical History  Procedure Laterality Date  . Mohs surgery      lower lip  . Foot surgery      right x2  . Angioplasty  09/20/10    Rt AK to tibioperoneal trunk BPG w/ vein patch angioplasty of tibioperoneal trunk   . Carpel tunnel surgery  per pt. 6-7 years ago    bilateral  (done 2 weeks apart)  . Pr vein bypass graft,aorto-fem-pop      Family History  Problem Relation Age of Onset  .  Adrenal disorder Brother   . Hyperlipidemia Brother   . Hypertension Brother   . Heart disease Brother     Heart Disease before age 47  . Heart attack Brother   . Heart disease Mother     Heart Disease before age 3  . Hyperlipidemia Mother   . Hypertension Mother   . Heart attack Mother   . Heart disease Father     Heart Disease before age 66  . Hyperlipidemia Father   . Heart attack Father   . Pulmonary embolism Sister     Allergies  Allergen Reactions  . Cortisone   . Oxycodone     Severe itching  . Sulfonamide Derivatives     REACTION: rash    Current Outpatient Prescriptions on File Prior to Visit  Medication Sig Dispense Refill  . aspirin 81 MG tablet Take 81 mg by mouth daily.        . Multiple Vitamin (MULTIVITAMIN) tablet Take 1 tablet by mouth daily.        Marland Kitchen omeprazole (PRILOSEC) 20 MG capsule Take 20 mg by mouth daily.        Marland Kitchen VITAMIN E PO Take by mouth.         No current facility-administered medications on file prior  to visit.    BP 148/90  Pulse 92  Temp(Src) 97.8 F (36.6 C) (Oral)  Resp 20  Wt 212 lb (96.163 kg)  SpO2 98%       Review of Systems  Constitutional: Positive for activity change and appetite change. Negative for fever, chills and fatigue.  HENT: Positive for sinus pressure, sore throat and voice change. Negative for congestion, dental problem, ear pain, hearing loss, tinnitus and trouble swallowing.   Eyes: Negative for pain, discharge and visual disturbance.  Respiratory: Positive for cough. Negative for chest tightness, wheezing and stridor.   Cardiovascular: Negative for chest pain, palpitations and leg swelling.  Gastrointestinal: Negative for nausea, vomiting, abdominal pain, diarrhea, constipation, blood in stool and abdominal distention.  Genitourinary: Negative for urgency, hematuria, flank pain, discharge, difficulty urinating and genital sores.  Musculoskeletal: Negative for arthralgias, back pain, gait problem, joint  swelling, myalgias and neck stiffness.  Skin: Negative for rash.  Neurological: Negative for dizziness, syncope, speech difficulty, weakness, numbness and headaches.  Hematological: Negative for adenopathy. Does not bruise/bleed easily.  Psychiatric/Behavioral: Negative for behavioral problems and dysphoric mood. The patient is not nervous/anxious.        Objective:   Physical Exam  Constitutional: He is oriented to person, place, and time. He appears well-developed.  HENT:  Head: Normocephalic.  Right Ear: External ear normal.  Left Ear: External ear normal.  Eyes: Conjunctivae and EOM are normal.  Neck: Normal range of motion.  Cardiovascular: Normal rate and normal heart sounds.   Pulmonary/Chest: Breath sounds normal.  Abdominal: Bowel sounds are normal.  Musculoskeletal: Normal range of motion. He exhibits no edema and no tenderness.  Neurological: He is alert and oriented to person, place, and time.  Psychiatric: He has a normal mood and affect. His behavior is normal.          Assessment & Plan:   Viral URI. Much improved. We'll continue symptomatic treatment. Hypertension stable Tobacco use. Total smoking cessation encouraged PAD. Follow vascular surgery Dyslipidemia. Continue statin therapy

## 2013-02-19 NOTE — Progress Notes (Signed)
Pre-visit discussion using our clinic review tool. No additional management support is needed unless otherwise documented below in the visit note.  

## 2013-02-25 ENCOUNTER — Encounter: Payer: Self-pay | Admitting: Vascular Surgery

## 2013-02-26 ENCOUNTER — Ambulatory Visit (HOSPITAL_COMMUNITY)
Admission: RE | Admit: 2013-02-26 | Discharge: 2013-02-26 | Disposition: A | Discharge status: Home / Self Care | Payer: Managed Care, Other (non HMO) | Source: Ambulatory Visit | Attending: Vascular Surgery | Admitting: Vascular Surgery

## 2013-02-26 ENCOUNTER — Encounter (HOSPITAL_COMMUNITY): Payer: Managed Care, Other (non HMO)

## 2013-02-26 ENCOUNTER — Ambulatory Visit (INDEPENDENT_AMBULATORY_CARE_PROVIDER_SITE_OTHER): Payer: Managed Care, Other (non HMO) | Admitting: Vascular Surgery

## 2013-02-26 ENCOUNTER — Encounter: Payer: Self-pay | Admitting: Vascular Surgery

## 2013-02-26 ENCOUNTER — Ambulatory Visit (INDEPENDENT_AMBULATORY_CARE_PROVIDER_SITE_OTHER)
Admission: RE | Admit: 2013-02-26 | Discharge: 2013-02-26 | Disposition: A | Discharge status: Home / Self Care | Payer: Managed Care, Other (non HMO) | Source: Ambulatory Visit | Attending: Vascular Surgery | Admitting: Vascular Surgery

## 2013-02-26 VITALS — BP 130/93 | HR 86 | Resp 16 | Ht 72.0 in | Wt 212.0 lb

## 2013-02-26 DIAGNOSIS — I739 Peripheral vascular disease, unspecified: Secondary | ICD-10-CM

## 2013-02-26 DIAGNOSIS — Z48812 Encounter for surgical aftercare following surgery on the circulatory system: Secondary | ICD-10-CM | POA: Insufficient documentation

## 2013-02-26 NOTE — Progress Notes (Signed)
The patient presents today for followup of his right above-knee to tibioperoneal trunk bypass on 09/20/2010 for severe arterial insufficiency. He reports that he is walking quite well. He does have what sounds like mild calf claudication with a great distance on the reports that this is tolerable and he is comfortable with the resting for a few minutes and going again. He has no tissue loss. His health otherwise has remained stable  Past Medical History  Diagnosis Date  . COLONIC POLYPS, HX OF 05/02/2007  . GERD 11/08/2006  . HYPERCHOLESTEROLEMIA, SEVERE 05/02/2007  . HYPERTENSION 11/08/2006  . LOW BACK PAIN 11/22/2007  . 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 Claustrophobia   . AAA (abdominal aortic aneurysm) without rupture   . Claudication     lower extremities  . Arthritis   . DVT (deep venous thrombosis)     History  Substance Use Topics  . Smoking status: Current Every Day Smoker -- 0.50 packs/day for 30 years    Types: Cigarettes  . Smokeless tobacco: Never Used  . Alcohol Use: No    Family History  Problem Relation Age of Onset  . Adrenal disorder Brother   . Hyperlipidemia Brother   . Hypertension Brother   . Heart disease Brother     Heart Disease before age 5  . Heart attack Brother   . Heart disease Mother     Heart Disease before age 92  . Hyperlipidemia Mother   . Hypertension Mother   . Heart attack Mother   . Heart disease Father     Heart Disease before age 6  . Hyperlipidemia Father   . Heart attack Father   . Pulmonary embolism Sister     Allergies  Allergen Reactions  . Cortisone   . Oxycodone     Severe itching  . Sulfonamide Derivatives     REACTION: rash    Current outpatient prescriptions:ALPRAZolam (XANAX) 0.5 MG tablet, Take 1 tablet (0.5 mg total) by mouth 2 (two) times daily., Disp: 90 tablet, Rfl: 5;  aspirin 81 MG tablet, Take 81 mg by mouth daily.  , Disp: , Rfl: ;  benazepril (LOTENSIN) 40 MG tablet, TAKE 1 TABLET BY MOUTH  EVERY DAY, Disp: 90 tablet, Rfl: 1;  hydrochlorothiazide (HYDRODIURIL) 25 MG tablet, Take 1 tablet (25 mg total) by mouth daily., Disp: 90 tablet, Rfl: 1 Multiple Vitamin (MULTIVITAMIN) tablet, Take 1 tablet by mouth daily.  , Disp: , Rfl: ;  omeprazole (PRILOSEC) 20 MG capsule, Take 20 mg by mouth daily.  , Disp: , Rfl: ;  potassium chloride SA (KLOR-CON M20) 20 MEQ tablet, TAKE ONE TABLET BY MOUTH EVERY DAY, Disp: 90 tablet, Rfl: 1;  pravastatin (PRAVACHOL) 40 MG tablet, TAKE 2 TABLETS BY MOUTH EVERY DAY, Disp: 180 tablet, Rfl: 3;  VITAMIN E PO, Take by mouth.  , Disp: , Rfl:   BP 130/93  Pulse 86  Resp 16  Ht 6' (1.829 m)  Wt 212 lb (96.163 kg)  BMI 28.75 kg/m2  Body mass index is 28.75 kg/(m^2).       Physical exam well-nourished gentleman appearing stated age in no acute distress His respirations are equal nonlabored Neurologically he is grossly intact Pulse status 2+ dorsalis pedis and posterior tibial pulse on the right. His at surgical incisions are all well-healed.  Nonevasive vascular studies were reviewed by myself discussed with the patient. This does show normal ankle arm index on the right with triphasic waveforms. He does have some elevated velocity at  the proximal anastomosis. This may be related to the angle of takeoff from the graft. The does not show any evidence of critical stenosis.  Stable status post distal superficial femoral to tibioperoneal trunk bypass. We'll continue his walking program. We'll see him again in 6 months with repeat vascular followup. I discussed the symptoms of limb ischemia notify us immediate should this occur

## 2013-02-27 NOTE — Addendum Note (Signed)
Addended by: Lorin Mercy K on: 02/27/2013 10:30 AM   Modules accepted: Orders

## 2013-05-17 ENCOUNTER — Inpatient Hospital Stay: Payer: Self-pay | Admitting: Surgery

## 2013-05-17 LAB — CBC
HCT: 42.4 % (ref 40.0–52.0)
HGB: 14.7 g/dL (ref 13.0–18.0)
MCH: 32.1 pg (ref 26.0–34.0)
MCHC: 34.6 g/dL (ref 32.0–36.0)
MCV: 93 fL (ref 80–100)
Platelet: 171 10*3/uL (ref 150–440)
RBC: 4.57 10*6/uL (ref 4.40–5.90)
RDW: 13.9 % (ref 11.5–14.5)
WBC: 14.3 10*3/uL — ABNORMAL HIGH (ref 3.8–10.6)

## 2013-05-17 LAB — COMPREHENSIVE METABOLIC PANEL
ALK PHOS: 42 U/L — AB
Albumin: 4.1 g/dL (ref 3.4–5.0)
Anion Gap: 2 — ABNORMAL LOW (ref 7–16)
BUN: 16 mg/dL (ref 7–18)
Bilirubin,Total: 0.4 mg/dL (ref 0.2–1.0)
Calcium, Total: 8.4 mg/dL — ABNORMAL LOW (ref 8.5–10.1)
Chloride: 104 mmol/L (ref 98–107)
Co2: 26 mmol/L (ref 21–32)
Creatinine: 1.69 mg/dL — ABNORMAL HIGH (ref 0.60–1.30)
EGFR (African American): 38 — ABNORMAL LOW
EGFR (Non-African Amer.): 33 — ABNORMAL LOW
Glucose: 118 mg/dL — ABNORMAL HIGH (ref 65–99)
Osmolality: 267 (ref 275–301)
POTASSIUM: 4.5 mmol/L (ref 3.5–5.1)
SGOT(AST): 29 U/L (ref 15–37)
SGPT (ALT): 27 U/L (ref 12–78)
Sodium: 132 mmol/L — ABNORMAL LOW (ref 136–145)
Total Protein: 7.5 g/dL (ref 6.4–8.2)

## 2013-05-17 LAB — URINALYSIS, COMPLETE
BILIRUBIN, UR: NEGATIVE
BLOOD: NEGATIVE
Bacteria: NONE SEEN
GLUCOSE, UR: NEGATIVE mg/dL (ref 0–75)
Hyaline Cast: 1
Ketone: NEGATIVE
Leukocyte Esterase: NEGATIVE
Nitrite: NEGATIVE
PROTEIN: NEGATIVE
Ph: 5 (ref 4.5–8.0)
RBC,UR: 1 /HPF (ref 0–5)
Specific Gravity: 1.031 (ref 1.003–1.030)
Squamous Epithelial: NONE SEEN
WBC UR: <1 /HPF (ref 0–5)

## 2013-05-17 LAB — TROPONIN I: Troponin-I: <0.02 ng/mL

## 2013-05-17 LAB — LIPASE, BLOOD: LIPASE: 152 U/L (ref 73–393)

## 2013-05-18 LAB — BASIC METABOLIC PANEL
ANION GAP: 4 — AB (ref 7–16)
BUN: 19 mg/dL — ABNORMAL HIGH (ref 7–18)
CALCIUM: 7.8 mg/dL — AB (ref 8.5–10.1)
Chloride: 103 mmol/L (ref 98–107)
Co2: 25 mmol/L (ref 21–32)
Creatinine: 1.83 mg/dL — ABNORMAL HIGH (ref 0.60–1.30)
EGFR (African American): 46 — ABNORMAL LOW
EGFR (Non-African Amer.): 40 — ABNORMAL LOW
Glucose: 87 mg/dL (ref 65–99)
OSMOLALITY: 266 (ref 275–301)
Potassium: 4.4 mmol/L (ref 3.5–5.1)
SODIUM: 132 mmol/L — AB (ref 136–145)

## 2013-05-18 LAB — CBC WITH DIFFERENTIAL/PLATELET
Basophil #: 0 10*3/uL (ref 0.0–0.1)
Basophil %: 0.1 %
EOS ABS: 0 10*3/uL (ref 0.0–0.7)
Eosinophil %: 0.1 %
HCT: 38.6 % — ABNORMAL LOW (ref 40.0–52.0)
HGB: 12.9 g/dL — AB (ref 13.0–18.0)
LYMPHS ABS: 1.4 10*3/uL (ref 1.0–3.6)
Lymphocyte %: 8.3 %
MCH: 31.1 pg (ref 26.0–34.0)
MCHC: 33.5 g/dL (ref 32.0–36.0)
MCV: 93 fL (ref 80–100)
Monocyte #: 1.5 x10 3/mm — ABNORMAL HIGH (ref 0.2–1.0)
Monocyte %: 9 %
NEUTROS ABS: 14 10*3/uL — AB (ref 1.4–6.5)
Neutrophil %: 82.5 %
PLATELETS: 172 10*3/uL (ref 150–440)
RBC: 4.15 10*6/uL — ABNORMAL LOW (ref 4.40–5.90)
RDW: 13.7 % (ref 11.5–14.5)
WBC: 16.9 10*3/uL — AB (ref 3.8–10.6)

## 2013-05-20 LAB — CBC WITH DIFFERENTIAL/PLATELET
BASOS ABS: 0 10*3/uL (ref 0.0–0.1)
Basophil %: 0.3 %
EOS ABS: 0.1 10*3/uL (ref 0.0–0.7)
EOS PCT: 1 %
HCT: 40.9 % (ref 40.0–52.0)
HGB: 13.8 g/dL (ref 13.0–18.0)
LYMPHS ABS: 1.3 10*3/uL (ref 1.0–3.6)
LYMPHS PCT: 14.3 %
MCH: 31.5 pg (ref 26.0–34.0)
MCHC: 33.8 g/dL (ref 32.0–36.0)
MCV: 93 fL (ref 80–100)
Monocyte #: 1.1 x10 3/mm — ABNORMAL HIGH (ref 0.2–1.0)
Monocyte %: 11.6 %
NEUTROS PCT: 72.8 %
Neutrophil #: 6.8 10*3/uL — ABNORMAL HIGH (ref 1.4–6.5)
Platelet: 184 10*3/uL (ref 150–440)
RBC: 4.38 10*6/uL — ABNORMAL LOW (ref 4.40–5.90)
RDW: 13.6 % (ref 11.5–14.5)
WBC: 9.3 10*3/uL (ref 3.8–10.6)

## 2013-05-20 LAB — BASIC METABOLIC PANEL
Anion Gap: 4 — ABNORMAL LOW (ref 7–16)
BUN: 13 mg/dL (ref 7–18)
CALCIUM: 8.9 mg/dL (ref 8.5–10.1)
CHLORIDE: 99 mmol/L (ref 98–107)
CO2: 31 mmol/L (ref 21–32)
CREATININE: 1.34 mg/dL — AB (ref 0.60–1.30)
GFR CALC NON AF AMER: 58 — AB
Glucose: 98 mg/dL (ref 65–99)
Osmolality: 268 (ref 275–301)
POTASSIUM: 4.1 mmol/L (ref 3.5–5.1)
SODIUM: 134 mmol/L — AB (ref 136–145)

## 2013-06-05 ENCOUNTER — Other Ambulatory Visit: Payer: Self-pay | Admitting: Surgery

## 2013-06-05 LAB — CBC WITH DIFFERENTIAL/PLATELET
BASOS ABS: 0.1 10*3/uL (ref 0.0–0.1)
Basophil %: 1.1 %
EOS ABS: 0.2 10*3/uL (ref 0.0–0.7)
Eosinophil %: 2 %
HCT: 47 % (ref 40.0–52.0)
HGB: 15.9 g/dL (ref 13.0–18.0)
LYMPHS ABS: 1.3 10*3/uL (ref 1.0–3.6)
LYMPHS PCT: 12.8 %
MCH: 31.2 pg (ref 26.0–34.0)
MCHC: 33.9 g/dL (ref 32.0–36.0)
MCV: 92 fL (ref 80–100)
MONOS PCT: 12 %
Monocyte #: 1.2 x10 3/mm — ABNORMAL HIGH (ref 0.2–1.0)
Neutrophil #: 7.4 10*3/uL — ABNORMAL HIGH (ref 1.4–6.5)
Neutrophil %: 72.1 %
PLATELETS: 417 10*3/uL (ref 150–440)
RBC: 5.1 10*6/uL (ref 4.40–5.90)
RDW: 13.3 % (ref 11.5–14.5)
WBC: 10.2 10*3/uL (ref 3.8–10.6)

## 2013-06-05 LAB — BASIC METABOLIC PANEL
Anion Gap: 8 (ref 7–16)
BUN: 21 mg/dL — ABNORMAL HIGH (ref 7–18)
Calcium, Total: 9.4 mg/dL (ref 8.5–10.1)
Chloride: 97 mmol/L — ABNORMAL LOW (ref 98–107)
Co2: 24 mmol/L (ref 21–32)
Creatinine: 1.64 mg/dL — ABNORMAL HIGH (ref 0.60–1.30)
EGFR (Non-African Amer.): 46 — ABNORMAL LOW
GFR CALC AF AMER: 53 — AB
GLUCOSE: 104 mg/dL — AB (ref 65–99)
Osmolality: 262 (ref 275–301)
Potassium: 4 mmol/L (ref 3.5–5.1)
Sodium: 129 mmol/L — ABNORMAL LOW (ref 136–145)

## 2013-06-06 ENCOUNTER — Ambulatory Visit: Payer: Self-pay | Admitting: Surgery

## 2013-06-07 ENCOUNTER — Telehealth: Payer: Self-pay | Admitting: Internal Medicine

## 2013-06-07 NOTE — Telephone Encounter (Signed)
Pt was hospitalized march 6 for diverticulitis. Pt has abdominal aortic aneurism and it has grown to 4.4. Dr Burt Knack wants dr Early to check it out but pt prefers to see you and go from there. No 30 min appt available. pls advise if you can see pt.  Also pt advised to get colonoscopy. Pt to make own appt. Pt has had pain w/ this and would like to know if dr Raliegh Ip will write rx for hydrocodone. They gave pt a few when dc'd, but pt is out.

## 2013-06-07 NOTE — Telephone Encounter (Signed)
Done tues 3/31 4 pm

## 2013-06-07 NOTE — Telephone Encounter (Signed)
Please schedule followup appointment for early next week

## 2013-06-07 NOTE — Telephone Encounter (Signed)
Please see message and advise 

## 2013-06-07 NOTE — Telephone Encounter (Signed)
Please see message from Dr.K. 

## 2013-06-11 ENCOUNTER — Ambulatory Visit (INDEPENDENT_AMBULATORY_CARE_PROVIDER_SITE_OTHER): Payer: Managed Care, Other (non HMO) | Admitting: Internal Medicine

## 2013-06-11 ENCOUNTER — Encounter: Payer: Self-pay | Admitting: Internal Medicine

## 2013-06-11 VITALS — BP 120/80 | HR 110 | Temp 98.2°F | Resp 20 | Ht 72.0 in | Wt 206.0 lb

## 2013-06-11 DIAGNOSIS — I739 Peripheral vascular disease, unspecified: Secondary | ICD-10-CM

## 2013-06-11 DIAGNOSIS — R109 Unspecified abdominal pain: Secondary | ICD-10-CM

## 2013-06-11 MED ORDER — HYDROCODONE-ACETAMINOPHEN 5-325 MG PO TABS: 1.0000 | ORAL_TABLET | Freq: Four times a day (QID) | ORAL | Status: DC | PRN

## 2013-06-11 NOTE — Progress Notes (Signed)
Subjective:    Patient ID: [name], male    DOB: [birth year], 58 y.o.   MRN: [id number]  HPI  58 year old patient who has a history of known peripheral vascular disease, and 4. 2 cm infrarenal AAA.  He was admitted to [facility] on March 9 and treated for acute diverticulitis.  He was noted at that time to have a stable AAA and was discharged on oral antibiotics.  He has had persistent pain and a followup abdominal CT scan was obtained on Thursday, March 26.  The acute diverticular inflammatory changes were improved , but the patient had a new ill-defined mass effect anterior to his stable appearing fusiform AAA.  Radiographically , concerns were raised about impending rupture.  Past Medical History  Diagnosis Date  . COLONIC POLYPS, HX OF 05/02/2007  . GERD 11/08/2006  . HYPERCHOLESTEROLEMIA, SEVERE 05/02/2007  . HYPERTENSION 11/08/2006  . LOW BACK PAIN 11/22/2007  . 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 Claustrophobia   . AAA (abdominal aortic aneurysm) without rupture   . Claudication     lower extremities  . Arthritis   . DVT (deep venous thrombosis)     History   Social History  . Marital Status: Married    Spouse Name: N/A    Number of Children: N/A  . Years of Education: N/A   Occupational History  . Not on file.   Social History Main Topics  . Smoking status: Current Every Day Smoker -- 0.50 packs/day for 30 years    Types: Cigarettes  . Smokeless tobacco: Never Used  . Alcohol Use: No  . Drug Use: No  . Sexual Activity: Not on file   Other Topics Concern  . Not on file   Social History Narrative  . No narrative on file    Past Surgical History  Procedure Laterality Date  . Mohs surgery      lower lip  . Foot surgery      right x2  . Angioplasty  09/20/10    Rt AK to tibioperoneal trunk BPG w/ vein patch angioplasty of tibioperoneal trunk   . Carpel tunnel surgery  per pt. 6-7 years ago    bilateral  (done 2 weeks apart)  . Pr vein  bypass graft,aorto-fem-pop      Family History  Problem Relation Age of Onset  . Adrenal disorder Brother   . Hyperlipidemia Brother   . Hypertension Brother   . Heart disease Brother     Heart Disease before age 58  . Heart attack Brother   . Heart disease Mother     Heart Disease before age 58  . Hyperlipidemia Mother   . Hypertension Mother   . Heart attack Mother   . Heart disease Father     Heart Disease before age 58  . Hyperlipidemia Father   . Heart attack Father   . Pulmonary embolism Sister     Allergies  Allergen Reactions  . Cortisone   . Oxycodone     Severe itching  . Sulfonamide Derivatives     REACTION: rash    Current Outpatient Prescriptions on File Prior to Visit  Medication Sig Dispense Refill  . ALPRAZolam (XANAX) 0.5 MG tablet Take 1 tablet (0.5 mg total) by mouth 2 (two) times daily.  90 tablet  5  . aspirin 81 MG tablet Take 81 mg by mouth daily.        . benazepril (LOTENSIN) 40 MG tablet TAKE 1 TABLET BY  MOUTH EVERY DAY  90 tablet  1  . hydrochlorothiazide (HYDRODIURIL) 25 MG tablet Take 1 tablet (25 mg total) by mouth daily.  90 tablet  1  . Multiple Vitamin (MULTIVITAMIN) tablet Take 1 tablet by mouth daily.        Marland Kitchen omeprazole (PRILOSEC) 20 MG capsule Take 20 mg by mouth daily.        . potassium chloride SA (KLOR-CON M20) 20 MEQ tablet TAKE ONE TABLET BY MOUTH EVERY DAY  90 tablet  1  . pravastatin (PRAVACHOL) 40 MG tablet TAKE 2 TABLETS BY MOUTH EVERY DAY  180 tablet  3  . VITAMIN E PO Take by mouth.         No current facility-administered medications on file prior to visit.    BP 120/80  Pulse 110  Temp(Src) 98.2 F (36.8 C) (Oral)  Resp 20  Ht 6' (1.829 m)  Wt 206 lb (93.441 kg)  BMI 27.93 kg/m2  SpO2 97%       Review of Systems  Constitutional: Positive for activity change, appetite change and fatigue. Negative for fever and chills.  HENT: Negative for congestion, dental problem, ear pain, hearing loss, sore throat,  tinnitus, trouble swallowing and voice change.   Eyes: Negative for pain, discharge and visual disturbance.  Respiratory: Negative for cough, chest tightness, wheezing and stridor.   Cardiovascular: Negative for chest pain, palpitations and leg swelling.  Gastrointestinal: Positive for abdominal pain. Negative for nausea, vomiting, diarrhea, constipation, blood in stool and abdominal distention.  Genitourinary: Negative for urgency, hematuria, flank pain, discharge, difficulty urinating and genital sores.  Musculoskeletal: Negative for arthralgias, back pain, gait problem, joint swelling, myalgias and neck stiffness.  Skin: Negative for rash.  Neurological: Negative for dizziness, syncope, speech difficulty, weakness, numbness and headaches.  Hematological: Negative for adenopathy. Does not bruise/bleed easily.  Psychiatric/Behavioral: Negative for behavioral problems and dysphoric mood. The patient is not nervous/anxious.        Objective:   Physical Exam  Constitutional: He is oriented to person, place, and time. He appears well-developed and well-nourished. No distress.  HENT:  Head: Normocephalic.  Right Ear: External ear normal.  Left Ear: External ear normal.  Eyes: Conjunctivae and EOM are normal.  Neck: Normal range of motion.  Cardiovascular: Regular rhythm and normal heart sounds.   Rate 100  Pulmonary/Chest: Effort normal and breath sounds normal.  Abdominal: Soft. Bowel sounds are normal. He exhibits no distension. There is tenderness. There is no rebound and no guarding.  Diffuse abdominal tenderness in all lower abdominal quadrants  Musculoskeletal: Normal range of motion. He exhibits no edema and no tenderness.  Neurological: He is alert and oriented to person, place, and time.  Psychiatric: He has a normal mood and affect. His behavior is normal.          Assessment & Plan:   Acute diverticulitis.  We'll continue oral antibiotic therapy.  Recheck 7  days Inflammatory mass anterior to AAA.  Unclear significance.  Discussed with vascular surgery who will see her probably tomorrow.   The patient will report to the ED with any worsening abdominal pain Dyslipidemia.  Continue statin therapy

## 2013-06-11 NOTE — Progress Notes (Signed)
Pre-visit discussion using our clinic review tool. No additional management support is needed unless otherwise documented below in the visit note.  

## 2013-06-11 NOTE — Patient Instructions (Signed)
Vascular surgical consultation tomorrow  Please bring all [facility] records, including x-ray reports in DVDs  Take your antibiotic as prescribed until ALL of it is gone, but stop if you develop a rash, swelling, or any side effects of the medication.  Contact our office as soon as possible if  there are side effects of the medication.  Report to the hospital immediately if any worsening abdominal pain

## 2013-06-12 ENCOUNTER — Ambulatory Visit (INDEPENDENT_AMBULATORY_CARE_PROVIDER_SITE_OTHER): Payer: Managed Care, Other (non HMO) | Admitting: Vascular Surgery

## 2013-06-12 ENCOUNTER — Encounter: Payer: Self-pay | Admitting: Vascular Surgery

## 2013-06-12 VITALS — BP 129/85 | HR 86 | Ht 72.0 in | Wt 205.0 lb

## 2013-06-12 DIAGNOSIS — I714 Abdominal aortic aneurysm, without rupture, unspecified: Secondary | ICD-10-CM | POA: Insufficient documentation

## 2013-06-12 DIAGNOSIS — Z48812 Encounter for surgical aftercare following surgery on the circulatory system: Secondary | ICD-10-CM

## 2013-06-12 NOTE — Progress Notes (Addendum)
[facility]  Patient name: [name] MRN: [id number] DOB: [birth year] Sex: male  REASON FOR CONSULT: Abdominal aortic aneurysm  HPI: [name] is a 58 y.o. male who has a known abdominal aortic aneurysm. On March 6, he developed some transient confusion and was admitted at [facility]. He was found have acute diverticulitis on CT scan. His CT scan on 05/17/2013, (which I have reviewed), showed a 4.4 cm infrarenal abdominal aortic aneurysm and mild dilatation of the common iliac arteries bilaterally. There was sigmoid diverticulosis and stranding around the sigmoid colon with a few small locules of extraluminal gas compatible with diverticulitis and probable microperforation. He was treated with intravenous antibiotics and eventually discharged on po Flagyl and Cipro.   He was set up for a follow up CT scan on 06/06/2013. This showed interval improvement in the changes of sigmoid diverticulitis with less colonic wall thickening and surrounding inflammatory change. There was symptoms persisted small extraluminal air collections between the sigmoid colon and bladder. Also noted was a subtle finding of some retroperitoneal soft tissue stranding anterior to the fusiform abdominal aortic aneurysm which was otherwise stable in appearance.  He comes in today because of the CT findings. He states that his lower abdominal pain has improved significantly but has not completely resolved. This is fairly constant pain in the lower abdomen mostly in the left lower quadrant.  He denies fever or chills. He is still on his antibiotics. He denies any sudden onset abdominal pain or back pain.  Patient was last seen in our office by Dr. Donnetta Hutching in December of 2014. He has a functioning right lower extremity bypass graft. He was due for a follow up study in June.   Past Medical History  Diagnosis Date  . COLONIC POLYPS, HX OF 05/02/2007  . GERD 11/08/2006  .  HYPERCHOLESTEROLEMIA, SEVERE 05/02/2007  . HYPERTENSION 11/08/2006  . LOW BACK PAIN 11/22/2007  . 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 Claustrophobia   . AAA (abdominal aortic aneurysm) without rupture   . Claudication     lower extremities  . Arthritis   . DVT (deep venous thrombosis)    Family History  Problem Relation Age of Onset  . Adrenal disorder Brother   . Hyperlipidemia Brother   . Hypertension Brother   . Heart disease Brother     Heart Disease before age 71  . Heart attack Brother   . Heart disease Mother     Heart Disease before age 5  . Hyperlipidemia Mother   . Hypertension Mother   . Heart attack Mother   . Heart disease Father     Heart Disease before age 46  . Hyperlipidemia Father   . Heart attack Father   . Pulmonary embolism Sister    SOCIAL HISTORY: History  Substance Use Topics  . Smoking status: Current Every Day Smoker -- 0.50 packs/day for 30 years    Types: Cigarettes  . Smokeless tobacco: Never Used  . Alcohol Use: No   Allergies  Allergen Reactions  . Cortisone   . Oxycodone     Severe itching  . Sulfonamide Derivatives     REACTION: rash   Current Outpatient Prescriptions  Medication Sig Dispense Refill  . ALPRAZolam (XANAX) 0.5 MG tablet Take 1 tablet (0.5 mg total) by mouth 2 (two) times daily.  90 tablet  5  . aspirin 81 MG tablet Take 81 mg by mouth daily.        Marland Kitchen  benazepril (LOTENSIN) 40 MG tablet TAKE 1 TABLET BY MOUTH EVERY DAY  90 tablet  1  . ciprofloxacin (CIPRO) 500 MG tablet Take 500 mg by mouth 2 (two) times daily.       . hydrochlorothiazide (HYDRODIURIL) 25 MG tablet Take 1 tablet (25 mg total) by mouth daily.  90 tablet  1  . HYDROcodone-acetaminophen (NORCO/VICODIN) 5-325 MG per tablet Take 1 tablet by mouth every 6 (six) hours as needed.  60 tablet  0  . metroNIDAZOLE (FLAGYL) 500 MG tablet Take 500 mg by mouth 3 (three) times daily.       . Multiple Vitamin (MULTIVITAMIN) tablet Take 1 tablet by mouth  daily.        Marland Kitchen omeprazole (PRILOSEC) 20 MG capsule Take 20 mg by mouth daily.        . potassium chloride SA (KLOR-CON M20) 20 MEQ tablet TAKE ONE TABLET BY MOUTH EVERY DAY  90 tablet  1  . pravastatin (PRAVACHOL) 40 MG tablet TAKE 2 TABLETS BY MOUTH EVERY DAY  180 tablet  3  . VITAMIN E PO Take by mouth.         No current facility-administered medications for this visit.   REVIEW OF SYSTEMS: Valu.Nieves ] denotes positive finding; [  ] denotes negative finding  CARDIOVASCULAR:  [ ]  chest pain   [ ]  chest pressure   [ ]  palpitations   [ ]  orthopnea   [ ]  dyspnea on exertion   [ ]  claudication   [ ]  rest pain   [ ]  DVT   [ ]  phlebitis PULMONARY:   [ ]  productive cough   [ ]  asthma   [ ]  wheezing NEUROLOGIC:   [ ]  weakness  [ ]  paresthesias  [ ]  aphasia  [ ]  amaurosis  [ ]  dizziness HEMATOLOGIC:   [ ]  bleeding problems   [ ]  clotting disorders MUSCULOSKELETAL:  [ ]  joint pain   [ ]  joint swelling [ ]  leg swelling GASTROINTESTINAL: [ ]   blood in stool  [ ]   hematemesis GENITOURINARY:  [ ]   dysuria  [ ]   hematuria PSYCHIATRIC:  [ ]  history of major depression INTEGUMENTARY:  [ ]  rashes  [ ]  ulcers CONSTITUTIONAL:  [ ]  fever   [ ]  chills  PHYSICAL EXAM: Filed Vitals:   06/12/13 1544  BP: 129/85  Pulse: 86  Height: 6' (1.829 m)  Weight: 205 lb (92.987 kg)  SpO2: 98%   Body mass index is 27.8 kg/(m^2). GENERAL: The patient is a well-nourished male, in no acute distress. The vital signs are documented above. CARDIOVASCULAR: There is a regular rate and rhythm. I do not detect carotid bruits. PULMONARY: There is good air exchange bilaterally without wheezing or rales. ABDOMEN: Soft and non-tender with normal pitched bowel sounds. His aneurysm is nontender. He has some very mild left lower quadrant tenderness. MUSCULOSKELETAL: There are no major deformities or cyanosis. NEUROLOGIC: No focal weakness or paresthesias are detected. SKIN: There are no ulcers or rashes noted. PSYCHIATRIC: The patient  has a normal affect.  DATA:  I have reviewed his CT scan from 05/17/2013 and also 06/06/2013. The CT findings are discussed above.  I have reviewed his records from [facility].  MEDICAL ISSUES:  Abdominal aneurysm without mention of rupture This patient has a 4.4 cm infrarenal abdominal aortic aneurysm and also ectasia of bilateral common iliac arteries. I do not see any evidence of retroperitoneal leak. He has some mild stranding anterior to his  aneurysm but this has remained stable comparing scan from 3/6 to 3/26. He clearly has diverticulitis which is resolving on antibiotics. Given that the aneurysm is small, he has not had any acute onset abdominal pain or back pain, and he did have significant diverticulitis, I do not think that the subtle finding on CT scan represents some acute change in his aneurysm. However, I think a close follow up is indicated and I have ordered a follow up CT scan in one month. I will arrange for him to see Dr. Donnetta Hutching at that time. We've also discussed the importance of tobacco cessation and he understands it continued tobacco use does increase his risk of aneurysm expansion and rupture. He will call sooner if he has any significant change in his symptoms.   [place] [facility] [place]per: [phone]

## 2013-06-12 NOTE — Assessment & Plan Note (Signed)
This patient has a 4.4 cm infrarenal abdominal aortic aneurysm and also ectasia of bilateral common iliac arteries. I do not see any evidence of retroperitoneal leak. He has some mild stranding anterior to his aneurysm but this has remained stable comparing scan from 3/6 to 3/26. He clearly has diverticulitis which is resolving on antibiotics. Given that the aneurysm is small, he has not had any acute onset abdominal pain or back pain, and he did have significant diverticulitis, I do not think that the subtle finding on CT scan represents some acute change in his aneurysm. However, I think a close follow up is indicated and I have ordered a follow up CT scan in one year. I will arrange for him to see Dr. Donnetta Hutching at that time. We've also discussed the importance of tobacco cessation and he understands it continued tobacco use does increase his risk of aneurysm expansion and rupture. He will call sooner if he has any significant change in his symptoms.

## 2013-06-12 NOTE — Addendum Note (Signed)
Addended by: Dorthula Rue L on: 06/12/2013 05:54 PM   Modules accepted: Orders

## 2013-06-13 ENCOUNTER — Encounter: Payer: Self-pay | Admitting: Emergency Medicine

## 2013-06-18 ENCOUNTER — Encounter: Payer: Self-pay | Admitting: Internal Medicine

## 2013-06-18 ENCOUNTER — Ambulatory Visit (INDEPENDENT_AMBULATORY_CARE_PROVIDER_SITE_OTHER): Payer: Managed Care, Other (non HMO) | Admitting: Internal Medicine

## 2013-06-18 VITALS — BP 128/84 | HR 91 | Temp 98.1°F | Resp 20 | Ht 72.0 in | Wt 207.0 lb

## 2013-06-18 DIAGNOSIS — I714 Abdominal aortic aneurysm, without rupture, unspecified: Secondary | ICD-10-CM

## 2013-06-18 DIAGNOSIS — R109 Unspecified abdominal pain: Secondary | ICD-10-CM

## 2013-06-18 NOTE — Progress Notes (Signed)
Subjective:    Patient ID: [name], male    DOB: 08-Oct-1955, 58 y.o.   MRN: [id number]  HPI 58 year old patient who is seen today in followup.  He has been hospitalized at [facility] for acute diverticulitis.  He is completing his second round of oral antibiotic therapy and also received IV antibiotics in the hospital.  He continues to slowly improve, but still having some abdominal pain.  There's been no fever or other constitutional complaints.  He remains on a soft diet. He was seen here last week and referred to vascular surgery due to concerns of his AAA.  He is scheduled for followup abdominal CT scan in about one month.  Past Medical History  Diagnosis Date  . COLONIC POLYPS, HX OF 05/02/2007  . GERD 11/08/2006  . HYPERCHOLESTEROLEMIA, SEVERE 05/02/2007  . HYPERTENSION 11/08/2006  . LOW BACK PAIN 11/22/2007  . 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 Claustrophobia   . AAA (abdominal aortic aneurysm) without rupture   . Claudication     lower extremities  . Arthritis   . DVT (deep venous thrombosis)     History   Social History  . Marital Status: Married    Spouse Name: N/A    Number of Children: N/A  . Years of Education: N/A   Occupational History  . Not on file.   Social History Main Topics  . Smoking status: Current Every Day Smoker -- 0.50 packs/day for 30 years    Types: Cigarettes  . Smokeless tobacco: Never Used  . Alcohol Use: No  . Drug Use: No  . Sexual Activity: Not on file   Other Topics Concern  . Not on file   Social History Narrative  . No narrative on file    Past Surgical History  Procedure Laterality Date  . Mohs surgery      lower lip  . Foot surgery      right x2  . Angioplasty  09/20/10    Rt AK to tibioperoneal trunk BPG w/ vein patch angioplasty of tibioperoneal trunk   . Carpel tunnel surgery  per pt. 6-7 years ago    bilateral  (done 2 weeks apart)  . Pr vein bypass graft,aorto-fem-pop      Family History    Problem Relation Age of Onset  . Adrenal disorder Brother   . Hyperlipidemia Brother   . Hypertension Brother   . Heart disease Brother     Heart Disease before age 38  . Heart attack Brother   . Heart disease Mother     Heart Disease before age 14  . Hyperlipidemia Mother   . Hypertension Mother   . Heart attack Mother   . Heart disease Father     Heart Disease before age 11  . Hyperlipidemia Father   . Heart attack Father   . Pulmonary embolism Sister     Allergies  Allergen Reactions  . Cortisone   . Oxycodone     Severe itching  . Sulfonamide Derivatives     REACTION: rash    Current Outpatient Prescriptions on File Prior to Visit  Medication Sig Dispense Refill  . ALPRAZolam (XANAX) 0.5 MG tablet Take 1 tablet (0.5 mg total) by mouth 2 (two) times daily.  90 tablet  5  . aspirin 81 MG tablet Take 81 mg by mouth daily.        . benazepril (LOTENSIN) 40 MG tablet TAKE 1 TABLET BY MOUTH EVERY DAY  90 tablet  1  . ciprofloxacin (CIPRO) 500 MG tablet Take 500 mg by mouth 2 (two) times daily.       . hydrochlorothiazide (HYDRODIURIL) 25 MG tablet Take 1 tablet (25 mg total) by mouth daily.  90 tablet  1  . HYDROcodone-acetaminophen (NORCO/VICODIN) 5-325 MG per tablet Take 1 tablet by mouth every 6 (six) hours as needed.  60 tablet  0  . Multiple Vitamin (MULTIVITAMIN) tablet Take 1 tablet by mouth daily.        Marland Kitchen omeprazole (PRILOSEC) 20 MG capsule Take 20 mg by mouth daily.        . potassium chloride SA (KLOR-CON M20) 20 MEQ tablet TAKE ONE TABLET BY MOUTH EVERY DAY  90 tablet  1  . pravastatin (PRAVACHOL) 40 MG tablet TAKE 2 TABLETS BY MOUTH EVERY DAY  180 tablet  3  . VITAMIN E PO Take by mouth.         No current facility-administered medications on file prior to visit.    BP 128/84  Pulse 91  Temp(Src) 98.1 F (36.7 C) (Oral)  Resp 20  Ht 6' (1.829 m)  Wt 207 lb (93.895 kg)  BMI 28.07 kg/m2  SpO2 98%      Review of Systems  Constitutional: Negative  for fever, chills, appetite change and fatigue.  HENT: Negative for congestion, dental problem, ear pain, hearing loss, sore throat, tinnitus, trouble swallowing and voice change.   Eyes: Negative for pain, discharge and visual disturbance.  Respiratory: Negative for cough, chest tightness, wheezing and stridor.   Cardiovascular: Negative for chest pain, palpitations and leg swelling.  Gastrointestinal: Positive for abdominal pain. Negative for nausea, vomiting, diarrhea, constipation, blood in stool and abdominal distention.  Genitourinary: Negative for urgency, hematuria, flank pain, discharge, difficulty urinating and genital sores.  Musculoskeletal: Negative for arthralgias, back pain, gait problem, joint swelling, myalgias and neck stiffness.  Skin: Negative for rash.  Neurological: Negative for dizziness, syncope, speech difficulty, weakness, numbness and headaches.  Hematological: Negative for adenopathy. Does not bruise/bleed easily.  Psychiatric/Behavioral: Negative for behavioral problems and dysphoric mood. The patient is not nervous/anxious.        Objective:   Physical Exam  Constitutional: He is oriented to person, place, and time. He appears well-developed. No distress.  HENT:  Head: Normocephalic.  Right Ear: External ear normal.  Left Ear: External ear normal.  Eyes: Conjunctivae and EOM are normal.  Neck: Normal range of motion.  Cardiovascular: Normal rate and normal heart sounds.   Pulmonary/Chest: Breath sounds normal.  Abdominal: Bowel sounds are normal. There is tenderness.  Mild right greater than left, lower abdominal tenderness Active bowel sounds No guarding or rebound  Musculoskeletal: Normal range of motion. He exhibits no edema and no tenderness.  Neurological: He is alert and oriented to person, place, and time.  Psychiatric: He has a normal mood and affect. His behavior is normal.          Assessment & Plan:   Slowly resolving diverticulitis.   Patient will complete antibiotic therapy.  Was given samples of Restora to take one daily for one week Hypertension stable  Recheck 4 months or as needed

## 2013-06-18 NOTE — Progress Notes (Signed)
Pre-visit discussion using our clinic review tool. No additional management support is needed unless otherwise documented below in the visit note.  

## 2013-06-18 NOTE — Patient Instructions (Signed)
Diverticulitis A diverticulum is a small pouch or sac on the colon. Diverticulosis is the presence of these diverticula on the colon. Diverticulitis is the irritation (inflammation) or infection of diverticula. CAUSES  The colon and its diverticula contain bacteria. If food particles block the tiny opening to a diverticulum, the bacteria inside can grow and cause an increase in pressure. This leads to infection and inflammation and is called diverticulitis. SYMPTOMS   Abdominal pain and tenderness. Usually, the pain is located on the left side of your abdomen. However, it could be located elsewhere.  Fever.  Bloating.  Feeling sick to your stomach (nausea).  Throwing up (vomiting).  Abnormal stools. DIAGNOSIS  Your caregiver will take a history and perform a physical exam. Since many things can cause abdominal pain, other tests may be necessary. Tests may include:  Blood tests.  Urine tests.  X-ray of the abdomen.  CT scan of the abdomen. Sometimes, surgery is needed to determine if diverticulitis or other conditions are causing your symptoms. TREATMENT  Most of the time, you can be treated without surgery. Treatment includes:  Resting the bowels by only having liquids for a few days. As you improve, you will need to eat a low-fiber diet.  Intravenous (IV) fluids if you are losing body fluids (dehydrated).  Antibiotic medicines that treat infections may be given.  Pain and nausea medicine, if needed.  Surgery if the inflamed diverticulum has burst. HOME CARE INSTRUCTIONS   Try a clear liquid diet (broth, tea, or water for as long as directed by your caregiver). You may then gradually begin a low-fiber diet as tolerated.  A low-fiber diet is a diet with less than 10 grams of fiber. Choose the foods below to reduce fiber in the diet:  White breads, cereals, rice, and pasta.  Cooked fruits and vegetables or soft fresh fruits and vegetables without the skin.  Ground or  well-cooked tender beef, ham, veal, lamb, pork, or poultry.  Eggs and seafood.  After your diverticulitis symptoms have improved, your caregiver may put you on a high-fiber diet. A high-fiber diet includes 14 grams of fiber for every 1000 calories consumed. For a standard 2000 calorie diet, you would need 28 grams of fiber. Follow these diet guidelines to help you increase the fiber in your diet. It is important to slowly increase the amount fiber in your diet to avoid gas, constipation, and bloating.  Choose whole-grain breads, cereals, pasta, and brown rice.  Choose fresh fruits and vegetables with the skin on. Do not overcook vegetables because the more vegetables are cooked, the more fiber is lost.  Choose more nuts, seeds, legumes, dried peas, beans, and lentils.  Look for food products that have greater than 3 grams of fiber per serving on the Nutrition Facts label.  Take all medicine as directed by your caregiver.  If your caregiver has given you a follow-up appointment, it is very important that you go. Not going could result in lasting (chronic) or permanent injury, pain, and disability. If there is any problem keeping the appointment, call to reschedule. SEEK MEDICAL CARE IF:   Your pain does not improve.  You have a hard time advancing your diet beyond clear liquids.  Your bowel movements do not return to normal. SEEK IMMEDIATE MEDICAL CARE IF:   Your pain becomes worse.  You have an oral temperature above 102 F (38.9 C), not controlled by medicine.  You have repeated vomiting.  You have bloody or black, tarry stools.    Symptoms that brought you to your caregiver become worse or are not getting better. MAKE SURE YOU:   Understand these instructions.  Will watch your condition.  Will get help right away if you are not doing well or get worse. Document Released: 12/08/2004 Document Revised: 05/23/2011 Document Reviewed: 04/05/2010 [facility] Patient Information  2014 [place]. Diverticulosis Diverticulosis is a common condition that develops when small pouches (diverticula) form in the wall of the colon. The risk of diverticulosis increases with age. It happens more often in people who eat a low-fiber diet. Most individuals with diverticulosis have no symptoms. Those individuals with symptoms usually experience abdominal pain, constipation, or loose stools (diarrhea). HOME CARE INSTRUCTIONS   Increase the amount of fiber in your diet as directed by your caregiver or dietician. This may reduce symptoms of diverticulosis.  Your caregiver may recommend taking a dietary fiber supplement.  Drink at least 6 to 8 glasses of water each day to prevent constipation.  Try not to strain when you have a bowel movement.  Your caregiver may recommend avoiding nuts and seeds to prevent complications, although this is still an uncertain benefit.  Only take over-the-counter or prescription medicines for pain, discomfort, or fever as directed by your caregiver. FOODS WITH HIGH FIBER CONTENT INCLUDE:  Fruits. Apple, peach, pear, tangerine, raisins, prunes.  Vegetables. Brussels sprouts, asparagus, broccoli, cabbage, carrot, cauliflower, romaine lettuce, spinach, summer squash, tomato, winter squash, zucchini.  Starchy Vegetables. Baked beans, kidney beans, lima beans, split peas, lentils, potatoes (with skin).  Grains. Whole wheat bread, brown rice, bran flake cereal, plain oatmeal, white rice, shredded wheat, bran muffins. SEEK IMMEDIATE MEDICAL CARE IF:   You develop increasing pain or severe bloating.  You have an oral temperature above 102 F (38.9 C), not controlled by medicine.  You develop vomiting or bowel movements that are bloody or black. Document Released: 11/26/2003 Document Revised: 05/23/2011 Document Reviewed: 07/29/2009 [facility] Patient Information 2014 [place].

## 2013-06-19 ENCOUNTER — Telehealth: Payer: Self-pay | Admitting: Internal Medicine

## 2013-06-19 NOTE — Telephone Encounter (Signed)
Relevant patient education mailed to patient.  

## 2013-07-03 ENCOUNTER — Ambulatory Visit: Payer: Managed Care, Other (non HMO) | Admitting: Vascular Surgery

## 2013-07-10 ENCOUNTER — Ambulatory Visit: Payer: Managed Care, Other (non HMO) | Admitting: Vascular Surgery

## 2013-07-12 ENCOUNTER — Other Ambulatory Visit: Payer: Self-pay | Admitting: Vascular Surgery

## 2013-07-12 LAB — BUN: BUN: 14 mg/dL (ref 6–23)

## 2013-07-12 LAB — CREATININE, SERUM: Creat: 1.21 mg/dL (ref 0.50–1.35)

## 2013-07-15 ENCOUNTER — Ambulatory Visit
Admission: RE | Admit: 2013-07-15 | Discharge: 2013-07-15 | Disposition: A | Discharge status: Home / Self Care | Payer: Managed Care, Other (non HMO) | Source: Ambulatory Visit | Attending: Vascular Surgery | Admitting: Vascular Surgery

## 2013-07-15 ENCOUNTER — Encounter: Payer: Self-pay | Admitting: Vascular Surgery

## 2013-07-15 DIAGNOSIS — I714 Abdominal aortic aneurysm, without rupture, unspecified: Secondary | ICD-10-CM

## 2013-07-15 DIAGNOSIS — Z48812 Encounter for surgical aftercare following surgery on the circulatory system: Secondary | ICD-10-CM

## 2013-07-15 MED ORDER — IOHEXOL 350 MG/ML SOLN: 80.0000 mL | Freq: Once | INTRAVENOUS | Status: DC | PRN

## 2013-07-15 MED ORDER — IOHEXOL 350 MG/ML SOLN
80.0000 mL | Freq: Once | INTRAVENOUS | Status: AC | PRN
  Administered 2013-07-15: 80 mL via INTRAVENOUS

## 2013-07-16 ENCOUNTER — Ambulatory Visit (INDEPENDENT_AMBULATORY_CARE_PROVIDER_SITE_OTHER): Payer: Managed Care, Other (non HMO) | Admitting: Vascular Surgery

## 2013-07-16 ENCOUNTER — Encounter: Payer: Self-pay | Admitting: Vascular Surgery

## 2013-07-16 VITALS — BP 132/81 | HR 93 | Ht 72.0 in | Wt 205.0 lb

## 2013-07-16 DIAGNOSIS — I714 Abdominal aortic aneurysm, without rupture, unspecified: Secondary | ICD-10-CM

## 2013-07-16 NOTE — Addendum Note (Signed)
Addended by: Mena Goes on: 07/16/2013 12:29 PM   Modules accepted: Orders

## 2013-07-16 NOTE — Progress Notes (Signed)
Here today for continued followup of his abdominal aortic aneurysm. He is well known to me from a prior right femoral-popliteal bypass in 2012 which is a done fine with this and since that time. He had an episode of diverticulitis in March and CT scan at that time showed some unusual stranding around his aorta in the retroperitoneum. It was felt this most likely was related to his diverticulitis. He is seen today for discussion of a CT scan from yesterday to follow this up. He reports he continues to have some left lower quadrant discomfort but this is tolerable.  Past Medical History  Diagnosis Date  . COLONIC POLYPS, HX OF 05/02/2007  . GERD 11/08/2006  . HYPERCHOLESTEROLEMIA, SEVERE 05/02/2007  . HYPERTENSION 11/08/2006  . LOW BACK PAIN 11/22/2007  . 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 Claustrophobia   . AAA (abdominal aortic aneurysm) without rupture   . Claudication     lower extremities  . Arthritis   . DVT (deep venous thrombosis)     History  Substance Use Topics  . Smoking status: Current Every Day Smoker -- 0.50 packs/day for 30 years    Types: Cigarettes  . Smokeless tobacco: Never Used  . Alcohol Use: No    Family History  Problem Relation Age of Onset  . Adrenal disorder Brother   . Hyperlipidemia Brother   . Hypertension Brother   . Heart disease Brother     Heart Disease before age 19  . Heart attack Brother   . Heart disease Mother     Heart Disease before age 98  . Hyperlipidemia Mother   . Hypertension Mother   . Heart attack Mother   . Heart disease Father     Heart Disease before age 33  . Hyperlipidemia Father   . Heart attack Father   . Pulmonary embolism Sister     Allergies  Allergen Reactions  . Cortisone   . Oxycodone     Severe itching  . Sulfonamide Derivatives     REACTION: rash    Current outpatient prescriptions:ALPRAZolam (XANAX) 0.5 MG tablet, Take 1 tablet (0.5 mg total) by mouth 2 (two) times daily., Disp: 90 tablet, Rfl:  5;  aspirin 81 MG tablet, Take 81 mg by mouth daily.  , Disp: , Rfl: ;  benazepril (LOTENSIN) 40 MG tablet, TAKE 1 TABLET BY MOUTH EVERY DAY, Disp: 90 tablet, Rfl: 1;  hydrochlorothiazide (HYDRODIURIL) 25 MG tablet, Take 1 tablet (25 mg total) by mouth daily., Disp: 90 tablet, Rfl: 1 HYDROcodone-acetaminophen (NORCO/VICODIN) 5-325 MG per tablet, Take 1 tablet by mouth every 6 (six) hours as needed., Disp: 60 tablet, Rfl: 0;  Multiple Vitamin (MULTIVITAMIN) tablet, Take 1 tablet by mouth daily.  , Disp: , Rfl: ;  omeprazole (PRILOSEC) 20 MG capsule, Take 20 mg by mouth daily.  , Disp: , Rfl: ;  potassium chloride SA (KLOR-CON M20) 20 MEQ tablet, TAKE ONE TABLET BY MOUTH EVERY DAY, Disp: 90 tablet, Rfl: 1 pravastatin (PRAVACHOL) 40 MG tablet, TAKE 2 TABLETS BY MOUTH EVERY DAY, Disp: 180 tablet, Rfl: 3;  VITAMIN E PO, Take by mouth.  , Disp: , Rfl: ;  ciprofloxacin (CIPRO) 500 MG tablet, Take 500 mg by mouth 2 (two) times daily. , Disp: , Rfl:   BP 132/81  Pulse 93  Ht 6' (1.829 m)  Wt 205 lb (92.987 kg)  BMI 27.80 kg/m2  SpO2 96%  Body mass index is 27.8 kg/(m^2).       Physical exam  well-developed well-nourished gentleman in no acute distress Palpable femoral and popliteal artery pulses bilaterally Abdomen soft. There is some tenderness in the left lower quadrant. None in his upper abdomen  CT scan from yesterday is an and pelvis revealed 4.4 cm infrarenal abdominal aortic aneurysm and 2 cm iliac ectasia bilaterally. The. He aortic stranding has resolved.  Impression and plan stable infrarenal abdominal aortic aneurysm. We will see him again in one year with ultrasound of this. Also we will evaluate his right leg bypass followup at this time as well.

## 2013-08-23 ENCOUNTER — Other Ambulatory Visit: Payer: Self-pay | Admitting: Internal Medicine

## 2013-08-27 ENCOUNTER — Encounter (HOSPITAL_COMMUNITY): Payer: Managed Care, Other (non HMO)

## 2013-08-27 ENCOUNTER — Ambulatory Visit: Payer: Managed Care, Other (non HMO) | Admitting: Family

## 2013-08-27 ENCOUNTER — Inpatient Hospital Stay (HOSPITAL_COMMUNITY): Admission: RE | Admit: 2013-08-27 | Payer: Managed Care, Other (non HMO) | Source: Ambulatory Visit

## 2013-09-06 MED ORDER — OMEPRAZOLE 20 MG PO CPDR: 20.0000 mg | DELAYED_RELEASE_CAPSULE | Freq: Every day | ORAL | Status: DC

## 2013-09-09 NOTE — Progress Notes (Signed)
Canceled appointment.

## 2014-02-08 ENCOUNTER — Other Ambulatory Visit: Payer: Self-pay | Admitting: Internal Medicine

## 2014-03-21 ENCOUNTER — Encounter: Payer: Self-pay | Admitting: Internal Medicine

## 2014-03-21 ENCOUNTER — Ambulatory Visit (INDEPENDENT_AMBULATORY_CARE_PROVIDER_SITE_OTHER): Payer: Managed Care, Other (non HMO) | Admitting: Internal Medicine

## 2014-03-21 VITALS — BP 112/80 | HR 100 | Temp 98.1°F | Resp 20 | Ht 72.0 in | Wt 216.0 lb

## 2014-03-21 DIAGNOSIS — E7800 Pure hypercholesterolemia, unspecified: Secondary | ICD-10-CM

## 2014-03-21 DIAGNOSIS — I10 Essential (primary) hypertension: Secondary | ICD-10-CM

## 2014-03-21 DIAGNOSIS — J0100 Acute maxillary sinusitis, unspecified: Secondary | ICD-10-CM

## 2014-03-21 MED ORDER — AMOXICILLIN-POT CLAVULANATE 875-125 MG PO TABS: 1.0000 | ORAL_TABLET | Freq: Two times a day (BID) | ORAL | Status: DC

## 2014-03-21 NOTE — Patient Instructions (Addendum)
Use saline irrigation, warm  moist compresses and over-the-counter decongestants only as directed.  Call if there is no improvement in 5 to 7 days, or sooner if you develop increasing pain, fever, or any new symptoms.  Take your antibiotic as prescribed until ALL of it is gone, but stop if you develop a rash, swelling, or any side effects of the medication.  Contact our office as soon as possible if  there are side effects of the medication.Sinusitis Sinusitis is redness, soreness, and inflammation of the paranasal sinuses. Paranasal sinuses are air pockets within the bones of your face (beneath the eyes, the middle of the forehead, or above the eyes). In healthy paranasal sinuses, mucus is able to drain out, and air is able to circulate through them by way of your nose. However, when your paranasal sinuses are inflamed, mucus and air can become trapped. This can allow bacteria and other germs to grow and cause infection. Sinusitis can develop quickly and last only a short time (acute) or continue over a long period (chronic). Sinusitis that lasts for more than 12 weeks is considered chronic.  CAUSES  Causes of sinusitis include:  Allergies.  Structural abnormalities, such as displacement of the cartilage that separates your nostrils (deviated septum), which can decrease the air flow through your nose and sinuses and affect sinus drainage.  Functional abnormalities, such as when the small hairs (cilia) that line your sinuses and help remove mucus do not work properly or are not present. SIGNS AND SYMPTOMS  Symptoms of acute and chronic sinusitis are the same. The primary symptoms are pain and pressure around the affected sinuses. Other symptoms include:  Upper toothache.  Earache.  Headache.  Bad breath.  Decreased sense of smell and taste.  A cough, which worsens when you are lying flat.  Fatigue.  Fever.  Thick drainage from your nose, which often is green and may contain pus  (purulent).  Swelling and warmth over the affected sinuses. DIAGNOSIS  Your health care provider will perform a physical exam. During the exam, your health care provider may:  Look in your nose for signs of abnormal growths in your nostrils (nasal polyps).  Tap over the affected sinus to check for signs of infection.  View the inside of your sinuses (endoscopy) using an imaging device that has a light attached (endoscope). If your health care provider suspects that you have chronic sinusitis, one or more of the following tests may be recommended:  Allergy tests.  Nasal culture. A sample of mucus is taken from your nose, sent to a lab, and screened for bacteria.  Nasal cytology. A sample of mucus is taken from your nose and examined by your health care provider to determine if your sinusitis is related to an allergy. TREATMENT  Most cases of acute sinusitis are related to a viral infection and will resolve on their own within 10 days. Sometimes medicines are prescribed to help relieve symptoms (pain medicine, decongestants, nasal steroid sprays, or saline sprays).  However, for sinusitis related to a bacterial infection, your health care provider will prescribe antibiotic medicines. These are medicines that will help kill the bacteria causing the infection.  Rarely, sinusitis is caused by a fungal infection. In theses cases, your health care provider will prescribe antifungal medicine. For some cases of chronic sinusitis, surgery is needed. Generally, these are cases in which sinusitis recurs more than 3 times per year, despite other treatments. HOME CARE INSTRUCTIONS   Drink plenty of water. Water helps  thin the mucus so your sinuses can drain more easily.  Use a humidifier.  Inhale steam 3 to 4 times a day (for example, sit in the bathroom with the shower running).  Apply a warm, moist washcloth to your face 3 to 4 times a day, or as directed by your health care provider.  Use  saline nasal sprays to help moisten and clean your sinuses.  Take medicines only as directed by your health care provider.  If you were prescribed either an antibiotic or antifungal medicine, finish it all even if you start to feel better. SEEK IMMEDIATE MEDICAL CARE IF:  You have increasing pain or severe headaches.  You have nausea, vomiting, or drowsiness.  You have swelling around your face.  You have vision problems.  You have a stiff neck.  You have difficulty breathing. MAKE SURE YOU:   Understand these instructions.  Will watch your condition.  Will get help right away if you are not doing well or get worse. Document Released: 02/28/2005 Document Revised: 07/15/2013 Document Reviewed: 03/15/2011 [facility] Patient Information 2015 [place], [place]. This information is not intended to replace advice given to you by your health care provider. Make sure you discuss any questions you have with your health care provider.

## 2014-03-21 NOTE — Progress Notes (Signed)
Subjective:    Patient ID: [name], male    DOB: 1956/02/09, 59 y.o.   MRN: [id number]  HPI 59 year old patient who has a history of hypertension, dyslipidemia and peripheral vascular disease.  For the past week he has had sinus and chest congestion.  More recently has developed worsening headache, green nasal discharge and some left facial discomfort.  There has been no documented fever.  Past Medical History  Diagnosis Date  . COLONIC POLYPS, HX OF 05/02/2007  . GERD 11/08/2006  . HYPERCHOLESTEROLEMIA, SEVERE 05/02/2007  . HYPERTENSION 11/08/2006  . LOW BACK PAIN 11/22/2007  . 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 Claustrophobia   . AAA (abdominal aortic aneurysm) without rupture   . Claudication     lower extremities  . Arthritis   . DVT (deep venous thrombosis)     History   Social History  . Marital Status: Married    Spouse Name: N/A    Number of Children: N/A  . Years of Education: N/A   Occupational History  . Not on file.   Social History Main Topics  . Smoking status: Current Every Day Smoker -- 0.50 packs/day for 30 years    Types: Cigarettes  . Smokeless tobacco: Never Used  . Alcohol Use: No  . Drug Use: No  . Sexual Activity: Not on file   Other Topics Concern  . Not on file   Social History Narrative    Past Surgical History  Procedure Laterality Date  . Mohs surgery      lower lip  . Foot surgery      right x2  . Angioplasty  09/20/10    Rt AK to tibioperoneal trunk BPG w/ vein patch angioplasty of tibioperoneal trunk   . Carpel tunnel surgery  per pt. 6-7 years ago    bilateral  (done 2 weeks apart)  . Pr vein bypass graft,aorto-fem-pop      Family History  Problem Relation Age of Onset  . Adrenal disorder Brother   . Hyperlipidemia Brother   . Hypertension Brother   . Heart disease Brother     Heart Disease before age 11  . Heart attack Brother   . Heart disease Mother     Heart Disease before age 22  . Hyperlipidemia  Mother   . Hypertension Mother   . Heart attack Mother   . Heart disease Father     Heart Disease before age 36  . Hyperlipidemia Father   . Heart attack Father   . Pulmonary embolism Sister     Allergies  Allergen Reactions  . Cortisone   . Oxycodone     Severe itching  . Sulfonamide Derivatives     REACTION: rash    Current Outpatient Prescriptions on File Prior to Visit  Medication Sig Dispense Refill  . ALPRAZolam (XANAX) 0.5 MG tablet TAKE 1 TABLET BY MOUTH TWICE A DAY 90 tablet 2  . aspirin 81 MG tablet Take 81 mg by mouth daily.      . benazepril (LOTENSIN) 40 MG tablet TAKE 1 TABLET BY MOUTH EVERY DAY 90 tablet 1  . hydrochlorothiazide (HYDRODIURIL) 25 MG tablet TAKE 1 TABLET BY MOUTH EVERY DAY 90 tablet 1  . Multiple Vitamin (MULTIVITAMIN) tablet Take 1 tablet by mouth daily.      Marland Kitchen omeprazole (PRILOSEC) 20 MG capsule Take 20 mg by mouth daily.      Marland Kitchen omeprazole (PRILOSEC) 20 MG capsule Take 1 capsule (20 mg total) by  mouth daily. 90 capsule 3  . potassium chloride SA (KLOR-CON M20) 20 MEQ tablet TAKE ONE TABLET BY MOUTH EVERY DAY 90 tablet 1  . pravastatin (PRAVACHOL) 40 MG tablet TAKE 2 TABLETS BY MOUTH EVERY DAY 180 tablet 3  . VITAMIN E PO Take by mouth.       No current facility-administered medications on file prior to visit.    BP 112/80 mmHg  Pulse 100  Temp(Src) 98.1 F (36.7 C) (Oral)  Resp 20  Ht 6' (1.829 m)  Wt 216 lb (97.977 kg)  BMI 29.29 kg/m2  SpO2 98%      Review of Systems  Constitutional: Positive for activity change, appetite change and fatigue. Negative for fever and chills.  HENT: Positive for congestion, postnasal drip and sinus pressure. Negative for dental problem, ear pain, hearing loss, sore throat, tinnitus, trouble swallowing and voice change.   Eyes: Negative for pain, discharge and visual disturbance.  Respiratory: Positive for cough. Negative for chest tightness, wheezing and stridor.   Cardiovascular: Negative for chest  pain, palpitations and leg swelling.  Gastrointestinal: Negative for nausea, vomiting, abdominal pain, diarrhea, constipation, blood in stool and abdominal distention.  Genitourinary: Negative for urgency, hematuria, flank pain, discharge, difficulty urinating and genital sores.  Musculoskeletal: Negative for myalgias, back pain, joint swelling, arthralgias, gait problem and neck stiffness.  Skin: Negative for rash.  Neurological: Positive for headaches. Negative for dizziness, syncope, speech difficulty, weakness and numbness.  Hematological: Negative for adenopathy. Does not bruise/bleed easily.  Psychiatric/Behavioral: Negative for behavioral problems and dysphoric mood. The patient is not nervous/anxious.        Objective:   Physical Exam  Constitutional: He is oriented to person, place, and time. He appears well-developed.  HENT:  Head: Normocephalic.  Right Ear: External ear normal.  Left Ear: External ear normal.  Mild left maxillary sinus tenderness  Eyes: Conjunctivae and EOM are normal.  Neck: Normal range of motion.  Cardiovascular: Normal rate, regular rhythm and normal heart sounds.   Pulmonary/Chest: Effort normal and breath sounds normal.  Abdominal: Bowel sounds are normal.  Musculoskeletal: Normal range of motion. He exhibits no edema or tenderness.  Neurological: He is alert and oriented to person, place, and time.  Psychiatric: He has a normal mood and affect. His behavior is normal.          Assessment & Plan:   URI.  Possible early sinusitis.  Symptoms have worsened after 1 week of symptoms now with some facial pain and worsening.  Drainage will treat with Augmentin for 7 days.  Will continue nasal decongestant expectorants and saline irrigation Hypertension, well-controlled Dyslipidemia.  Continue statin therapy

## 2014-03-21 NOTE — Progress Notes (Signed)
Pre visit review using our clinic review tool, if applicable. No additional management support is needed unless otherwise documented below in the visit note. 

## 2014-03-24 ENCOUNTER — Telehealth: Payer: Self-pay | Admitting: Internal Medicine

## 2014-03-24 NOTE — Telephone Encounter (Signed)
emmi mailed  °

## 2014-04-23 ENCOUNTER — Other Ambulatory Visit: Payer: Self-pay | Admitting: Internal Medicine

## 2014-06-18 ENCOUNTER — Other Ambulatory Visit: Payer: Self-pay | Admitting: Vascular Surgery

## 2014-06-18 DIAGNOSIS — Z48812 Encounter for surgical aftercare following surgery on the circulatory system: Secondary | ICD-10-CM

## 2014-06-18 DIAGNOSIS — I714 Abdominal aortic aneurysm, without rupture, unspecified: Secondary | ICD-10-CM

## 2014-06-18 DIAGNOSIS — I739 Peripheral vascular disease, unspecified: Secondary | ICD-10-CM

## 2014-07-05 NOTE — H&P (Signed)
PATIENT NAME:  [name], [name] MR#:  [id number] DATE OF BIRTH:  08-16-1955  DATE OF ADMISSION:  05/17/2013  CHIEF COMPLAINT: Suprapubic abdominal pain.   HISTORY OF PRESENT ILLNESS: This is [name] patient with the acute onset of abdominal pain. He points to the suprapubic area, states that it hurts when he urinates. He had diarrhea that started yesterday, without melena or hematochezia. He has not vomited, but has had some dry heaves, and has been unable to eat. He denies fevers or chills. Denies any prior episode.   PAST MEDICAL HISTORY: Peripheral vascular disease, and has had stents placed in his popliteal artery of the right leg for claudication. It did not improve his claudication very much. Denies [name] history of myocardial infarction or stroke. Has hypertension, hypercholesterolemia, and tobacco abuse.   MEDICATIONS:  Of note, no medications are remembered by name by the patient, and none are reconciled in the chart .   PAST SURGICAL HISTORY: Denies any abdominal surgery.   FAMILY HISTORY: Noncontributory.   SOCIAL HISTORY: The patient works at [employer] on [employer]. He smokes 1-1/2 pack of cigarettes per day. Drinks alcohol occasionally.   REVIEW OF SYSTEMS: [name] 10-system review has been performed and negative, with the exception of that mentioned in the HPI.    PHYSICAL EXAMINATION: GENERAL: Uncomfortable-appearing male patient.  HEENT: Shows no scleral icterus. No palpable neck nodes.  CHEST: Clear to auscultation.  CARDIAC: Regular rate and rhythm.  ABDOMEN: Distended. There is [name] small umbilical hernia which is reducible and nontender and nonerythematous. He is tender in the suprapubic area, with minimal guarding, and tenderness in the left lower quadrant, with minimal guarding. Minimal percussion tenderness. No rebound tenderness.  EXTREMITIES: Show minimal edema.  NEUROLOGIC: Grossly intact.  INTEGUMENT: Shows no jaundice.  VITAL SIGNS: Cannot be accessed in the computer at  this point, but he was stable on the monitor in the room.   DATA:  CT scan is personally reviewed. There is an abdominal aortic aneurysm, which is not showing any signs of rupture. There is diverticulosis, diverticulitis in the pelvis. Electrolytes are within normal limits. Sodium slightly depressed at 132, creatinine 1.7, BUN of 16, lipase 152. White blood cell count is 14.3, H and H of 14.7 and 42, and [name] platelet count of 171.   ASSESSMENT AND PLAN: This is [name] patient with acute diverticulitis with microperforation. Recommend admission to the hospital, with control of his pain and nausea, utilizing IV antibiotics and IV hydration as well. The options of emergency surgery involving [name] colostomy was discussed with him and his family, and the potential for an emergency operation, should he worsen, and that expected improvement on IV antibiotics could take 24 to 48 hours. The rationale for this approach has been discussed with him. He and his family understood and agreed to proceed.     ____________________________ Jerrol Banana Burt Knack, MD rec:mr D: 05/17/2013 21:02:11 ET T: 05/17/2013 21:28:02 ET JOB#: [id number]  cc: Jerrol Banana. Burt Knack, MD, <Dictator> Florene Glen MD ELECTRONICALLY SIGNED 05/17/2013 22:10

## 2014-07-05 NOTE — H&P (Signed)
Subjective/Chief Complaint SP abd pain   History of Present Illness one day pain, no prior episode, sudden onset, diarrhea yesterday no vomiting but dry heaves and nausea no f/c, no prior episodes no melena no hematochezia   Past History PVD, stents to rt artey leg no MI, No CVA HTN  No meds reconciled or available   Past Medical Health Hypertension, Smoking   Past Med/Surgical Hx:  GERD - Esophageal Reflux:   Hypercholesterolemia:   Hypertension:   ALLERGIES:  Oxycodone: Itching  Family and Social History:  Family History Non-Contributory   Social History positive  tobacco, sherwin williams   + Tobacco Current (within 1 year)   Place of Living Home   Review of Systems:  Fever/Chills No   Cough No   Abdominal Pain Yes   Diarrhea Yes   Constipation No   Nausea/Vomiting Yes   SOB/DOE No   Chest Pain No   Dysuria No   Tolerating Diet No   Medications/Allergies Reviewed Medications/Allergies reviewed  unable to remember names   Physical Exam:  GEN uncomfortable   HEENT pink conjunctivae   NECK supple   RESP normal resp effort  wheezing  rhonchi   CARD regular rate   ABD positive tenderness  positive hernia  soft  UH, Sp and LLQ tenederness, mild guarding   LYMPH negative neck   EXTR negative edema   SKIN normal to palpation   PSYCH alert, A+O to time, place, person   Lab Results: Hepatic:  06-Mar-15 15:07   Bilirubin, Total 0.4  Alkaline Phosphatase  42 (45-117 NOTE: New Reference Range 02/01/13)  SGPT (ALT) 27  SGOT (AST) 29  Total Protein, Serum 7.5  Albumin, Serum 4.1  Routine Chem:  06-Mar-15 15:07   Lipase 152 (Result(s) reported on 17 May 2013 at 03:38PM.)  Glucose, Serum  118  BUN 16  Creatinine (comp)  1.69  Sodium, Serum  132  Potassium, Serum 4.5  Chloride, Serum 104  CO2, Serum 26  Calcium (Total), Serum  8.4  Osmolality (calc) 267  eGFR (African American)  38  eGFR (Non-African American)  33 (eGFR values  <70mL/min/1.73 m2 may be an indication of chronic kidney disease (CKD). Calculated eGFR is useful in patients with stable renal function. The eGFR calculation will not be reliable in acutely ill patients when serum creatinine is changing rapidly. It is not useful in  patients on dialysis. The eGFR calculation may not be applicable to patients at the low and high extremes of body sizes, pregnant women, and vegetarians.)  Anion Gap  2  Cardiac:  06-Mar-15 15:07   Troponin I < 0.02 (0.00-0.05 0.05 ng/mL or less: NEGATIVE  Repeat testing in 3-6 hrs  if clinically indicated. >0.05 ng/mL: POTENTIAL  MYOCARDIAL INJURY. Repeat  testing in 3-6 hrs if  clinically indicated. NOTE: An increase or decrease  of 30% or more on serial  testing suggests a  clinically important change)  Routine Hem:  06-Mar-15 15:07   WBC (CBC)  14.3  RBC (CBC) 4.57  Hemoglobin (CBC) 14.7  Hematocrit (CBC) 42.4  Platelet Count (CBC) 171 (Result(s) reported on 17 May 2013 at 03:25PM.)  MCV 93  MCH 32.1  MCHC 34.6  RDW 13.9   Radiology Results: CT:    06-Mar-15 17:25, CT Angiography Chest/Abd/Pelvis w/wo  CT Angiography Chest/Abd/Pelvis w/wo  REASON FOR EXAM:    chest pain, abdominal pain, h/o AAA  COMMENTS:       PROCEDURE: CT  - CT ANGIOGRAPHY CHEST/ABD/PELVIS  -  May 17 2013  5:25PM     CLINICAL DATA:  Syncope. Chest pain, abdominal pain. History of AAA.    EXAM:  CT ANGIOGRAPHY CHEST, ABDOMEN AND PELVIS    TECHNIQUE:  Multidetector CT imaging through the chest, abdomen and pelvis was  performed using the standard protocol during bolus administration of  intravenous contrast. Multiplanar reconstructed images and MIPs were  obtained and reviewed to evaluate the vascular anatomy.  CONTRAST:  100 cc Isovue 370 IV.    COMPARISON:  None.    FINDINGS:  CTA CHEST FINDINGS    No filling defects in the pulmonary arteries to suggest pulmonary  emboli. Heart is normal size. Aorta is normalcaliber.  Dense  coronary artery calcifications throughout the left anterior  descending, circumflex and right coronary arteries. Heart is normal  size. Aorta is normal caliber. No evidence of dissection. Maximum  aortic diameter is in the ascending thoracic aorta measuring 3.7 cm.    Lungs are clear. No focal airspace opacities or suspicious nodules.  No effusions. Chest wall soft tissues are unremarkable. No  mediastinal, hilar, or axillary adenopathy.    Review of the MIP images confirms the above findings.    CTA ABDOMEN AND PELVIS FINDINGS    4.4 cm infrarenal abdominal aortic aneurysm noted. Irregular mural  plaque. No dissection. 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  Celiac artery, superior mesenteric artery are widely patent.  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 Two left renal arteries. The accessory  inferior left renal artery appears mildly stenotic at its origin.  Mild to moderate stenosis at the origin of the single right renal  artery.    Liver, gallbladder, spleen, pancreas, left adrenal are unremarkable.  Diffuse fullness of the right adrenal gland compatible with  hyperplasia. Small hypodensities scattered throughout the kidneys  bilaterally, too small to characterize but most likely small cysts.  No hydronephrosis.    There is sigmoid diverticulosis. Stranding around the sigmoid colon  with a few locules of extraluminal gas compatible with  diverticulitis and probable micro perforation. Small bowel is  decompressed. No free fluid or adenopathy. Appendix is visualized  and is normal.    Review of the MIP images confirms the above findings.   IMPRESSION:  4.4 cm infrarenal abdominal aortic aneurysm with irregular mural  plaque. No evidence of dissection. No evidence of thoracic aortic  aneurysm or dissection.    Changes of diverticulosis with  active diverticulitis in the sigmoid  colon. Evidence of microperforation with small locules of  extraluminal gas adjacent to the sigmoid colon.    Layering gallstones within the gallbladder.    Coronary artery disease.      Electronically Signed    By: Rolm Baptise M.D.    On: 05/17/2013 17:40         Verified By: Raelyn Number, M.D.,    Assessment/Admission Diagnosis ac diverticulitis admit pain and nausea control IV abx   Electronic Signatures: Florene Glen (MD)  (Signed 06-Mar-15 20:46)  Authored: CHIEF COMPLAINT and HISTORY, PAST MEDICAL/SURGIAL HISTORY, ALLERGIES, FAMILY AND SOCIAL HISTORY, REVIEW OF SYSTEMS, PHYSICAL EXAM, LABS, Radiology, ASSESSMENT AND PLAN   Last Updated: 06-Mar-15 20:46 by Florene Glen (MD)

## 2014-07-05 NOTE — Discharge Summary (Signed)
PATIENT NAME:  [name], [name] MR#:  [id number] DATE OF BIRTH:  06/26/1955  DATE OF ADMISSION:  05/17/2013 DATE OF DISCHARGE:  05/20/2013  DIAGNOSES: Acute diverticulitis, peripheral vascular disease, hypertension, hypercholesterolemia, tobacco abuse.   PROCEDURES: None.   HISTORY OF PRESENT ILLNESS AND HOSPITAL COURSE: This is [name] patient who was admitted to the hospital through the Emergency Room with [name] diagnosis of acute diverticulitis with suprapubic and left lower quadrant pain and tenderness, with leukocytosis and CT findings suggestive of acute diverticulitis. He was treated with IV antibiotics. Improved considerably. Did not require any sort of drainage procedure. Was discharged in stable condition to follow up in our office on oral antibiotics and analgesics. He is instructed to return should he have worsening of his pain or fevers.    ____________________________ Jerrol Banana Burt Knack, MD rec:gb D: 05/27/2013 14:49:18 ET T: 05/28/2013 00:17:28 ET JOB#: [id number]  cc: Jerrol Banana. Burt Knack, MD, <Dictator> Florene Glen MD ELECTRONICALLY SIGNED 05/28/2013 15:07

## 2014-07-13 ENCOUNTER — Other Ambulatory Visit: Payer: Self-pay | Admitting: Internal Medicine

## 2014-07-18 ENCOUNTER — Encounter: Payer: Self-pay | Admitting: Vascular Surgery

## 2014-07-21 ENCOUNTER — Other Ambulatory Visit: Payer: Self-pay | Admitting: Internal Medicine

## 2014-07-22 ENCOUNTER — Other Ambulatory Visit: Payer: Self-pay | Admitting: Vascular Surgery

## 2014-07-22 ENCOUNTER — Ambulatory Visit (HOSPITAL_COMMUNITY)
Admission: RE | Admit: 2014-07-22 | Discharge: 2014-07-22 | Disposition: A | Discharge status: Home / Self Care | Payer: Managed Care, Other (non HMO) | Source: Ambulatory Visit | Attending: Vascular Surgery | Admitting: Vascular Surgery

## 2014-07-22 ENCOUNTER — Ambulatory Visit (INDEPENDENT_AMBULATORY_CARE_PROVIDER_SITE_OTHER): Payer: Managed Care, Other (non HMO) | Admitting: Vascular Surgery

## 2014-07-22 ENCOUNTER — Ambulatory Visit (INDEPENDENT_AMBULATORY_CARE_PROVIDER_SITE_OTHER)
Admission: RE | Admit: 2014-07-22 | Discharge: 2014-07-22 | Disposition: A | Discharge status: Home / Self Care | Payer: Managed Care, Other (non HMO) | Source: Ambulatory Visit | Attending: Vascular Surgery | Admitting: Vascular Surgery

## 2014-07-22 ENCOUNTER — Encounter: Payer: Self-pay | Admitting: Vascular Surgery

## 2014-07-22 VITALS — BP 126/78 | HR 76 | Resp 18 | Ht 72.0 in | Wt 217.0 lb

## 2014-07-22 DIAGNOSIS — Z48812 Encounter for surgical aftercare following surgery on the circulatory system: Secondary | ICD-10-CM

## 2014-07-22 DIAGNOSIS — E785 Hyperlipidemia, unspecified: Secondary | ICD-10-CM | POA: Insufficient documentation

## 2014-07-22 DIAGNOSIS — I714 Abdominal aortic aneurysm, without rupture, unspecified: Secondary | ICD-10-CM

## 2014-07-22 DIAGNOSIS — I10 Essential (primary) hypertension: Secondary | ICD-10-CM | POA: Insufficient documentation

## 2014-07-22 DIAGNOSIS — I739 Peripheral vascular disease, unspecified: Secondary | ICD-10-CM

## 2014-07-22 DIAGNOSIS — I70301 Unspecified atherosclerosis of unspecified type of bypass graft(s) of the extremities, right leg: Secondary | ICD-10-CM | POA: Insufficient documentation

## 2014-07-22 DIAGNOSIS — F17200 Nicotine dependence, unspecified, uncomplicated: Secondary | ICD-10-CM | POA: Diagnosis not present

## 2014-07-22 LAB — CREATININE, SERUM: Creat: 1.5 mg/dL — ABNORMAL HIGH (ref 0.50–1.35)

## 2014-07-22 LAB — BUN: BUN: 18 mg/dL (ref 6–23)

## 2014-07-22 NOTE — Addendum Note (Signed)
Addended by: Reola Calkins on: 07/22/2014 03:24 PM   Modules accepted: Orders

## 2014-07-22 NOTE — Progress Notes (Signed)
Patient is here today for continued follow-up of his abdominal aortic aneurysm. Also for follow-up of his right above-knee popliteal to tibioperoneal tibial peroneal trunk bypass in 2012 for occlusive disease. He does report what sounds like claudication symptoms in his right leg. He reports this is in his distal thigh and also in his calf. Reports similar to symptoms he had prior to his bypass in 2012. He has no symptoms referable to his aneurysm. Had no recurrent episodes of colitis.  Past Medical History  Diagnosis Date  . COLONIC POLYPS, HX OF 05/02/2007  . GERD 11/08/2006  . HYPERCHOLESTEROLEMIA, SEVERE 05/02/2007  . HYPERTENSION 11/08/2006  . LOW BACK PAIN 11/22/2007  . 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 Claustrophobia   . AAA (abdominal aortic aneurysm) without rupture   . Claudication     lower extremities  . Arthritis   . DVT (deep venous thrombosis)   . AAA (abdominal aortic aneurysm)     History  Substance Use Topics  . Smoking status: Current Every Day Smoker -- 0.50 packs/day for 30 years    Types: Cigarettes  . Smokeless tobacco: Never Used  . Alcohol Use: No    Family History  Problem Relation Age of Onset  . Adrenal disorder Brother   . Hyperlipidemia Brother   . Hypertension Brother   . Heart disease Brother     Heart Disease before age 65  . Heart attack Brother   . Heart disease Mother     Heart Disease before age 71  . Hyperlipidemia Mother   . Hypertension Mother   . Heart attack Mother   . Heart disease Father     Heart Disease before age 69  . Hyperlipidemia Father   . Heart attack Father   . Pulmonary embolism Sister     Allergies  Allergen Reactions  . Cortisone   . Oxycodone     Severe itching  . Sulfonamide Derivatives     REACTION: rash     Current outpatient prescriptions:  .  ALPRAZolam (XANAX) 0.5 MG tablet, TAKE 1 TABLET BY MOUTH 2 TIMES A DAY AS NEEDED., Disp: 90 tablet, Rfl: 0 .  amoxicillin-clavulanate (AUGMENTIN)  875-125 MG per tablet, Take 1 tablet by mouth 2 (two) times daily. (Patient not taking: Reported on 07/22/2014), Disp: 20 tablet, Rfl: 0 .  aspirin 81 MG tablet, Take 81 mg by mouth daily.  , Disp: , Rfl:  .  benazepril (LOTENSIN) 40 MG tablet, TAKE 1 TABLET BY MOUTH EVERY DAY, Disp: 90 tablet, Rfl: 1 .  hydrochlorothiazide (HYDRODIURIL) 25 MG tablet, TAKE 1 TABLET BY MOUTH EVERY DAY, Disp: 90 tablet, Rfl: 1 .  Multiple Vitamin (MULTIVITAMIN) tablet, Take 1 tablet by mouth daily.  , Disp: , Rfl:  .  omeprazole (PRILOSEC) 20 MG capsule, Take 20 mg by mouth daily.  , Disp: , Rfl:  .  omeprazole (PRILOSEC) 20 MG capsule, Take 1 capsule (20 mg total) by mouth daily., Disp: 90 capsule, Rfl: 3 .  potassium chloride SA (KLOR-CON M20) 20 MEQ tablet, TAKE ONE TABLET BY MOUTH EVERY DAY, Disp: 90 tablet, Rfl: 1 .  pravastatin (PRAVACHOL) 40 MG tablet, TAKE 2 TABLETS BY MOUTH EVERY DAY, Disp: 180 tablet, Rfl: 0 .  VITAMIN E PO, Take by mouth.  , Disp: , Rfl:   Filed Vitals:   07/22/14 1112  BP: 126/78  Pulse: 76  Resp: 18  Height: 6' (1.829 m)  Weight: 217 lb (98.431 kg)    Body mass index  is 29.42 kg/(m^2).       Physical exam is well-developed well-nourished no acute distress Respirations are equal in nonlabored Does have palpable dorsalis pedis pulses bilaterally. Well-healed incisions from his prior right above-knee to below-knee bypass. Abdomen soft nontender. No aneurysm palpable. Moderate obesity  Duplex today reveals increase in his aneurysm size 4.9 cm. Also has significantly elevated velocities in his iliac arteries bilaterally. Right common iliac artery velocity is 325 cm/s and 275 cm/s in his left common iliac. He does have a triphasic waveforms bilaterally in his lower extremities and duplex imaging of his bypass does not show any evidence of severe stenosis. He does have moderate elevation of 274 meters per second at the popliteal space behind his right knee.  Impression and plan  continued slow aneurysm. This is actually gone from 4.4 cm one year ago to 4.9 cm today. He reports that his difficulty tolerating his current level claudication. He reports that he cannot walk from the exam room to parking area without stopping due to discomfort in his leg. 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 I have recommended CT angiogram of his abdomen and pelvis and also bilateral runoff through a CT angiogram to determine his anatomy. Splane is somewhat concerning with half centimeter growth in a year. Will make further suggestions pending his CT scan. We'll see him again in 2-3 weeks following outpatient imaging.

## 2014-07-30 ENCOUNTER — Other Ambulatory Visit: Payer: Self-pay | Admitting: Internal Medicine

## 2014-08-08 ENCOUNTER — Other Ambulatory Visit: Payer: Self-pay | Admitting: Vascular Surgery

## 2014-08-08 ENCOUNTER — Encounter: Payer: Self-pay | Admitting: Internal Medicine

## 2014-08-08 ENCOUNTER — Ambulatory Visit (INDEPENDENT_AMBULATORY_CARE_PROVIDER_SITE_OTHER): Payer: Managed Care, Other (non HMO) | Admitting: Internal Medicine

## 2014-08-08 VITALS — BP 128/84 | HR 97 | Temp 98.1°F | Resp 20 | Ht 72.0 in | Wt 213.0 lb

## 2014-08-08 DIAGNOSIS — I10 Essential (primary) hypertension: Secondary | ICD-10-CM

## 2014-08-08 DIAGNOSIS — I714 Abdominal aortic aneurysm, without rupture, unspecified: Secondary | ICD-10-CM

## 2014-08-08 DIAGNOSIS — M545 Low back pain: Secondary | ICD-10-CM

## 2014-08-08 DIAGNOSIS — I739 Peripheral vascular disease, unspecified: Secondary | ICD-10-CM

## 2014-08-08 MED ORDER — TRAMADOL HCL 50 MG PO TABS: 50.0000 mg | ORAL_TABLET | Freq: Four times a day (QID) | ORAL | Status: DC | PRN

## 2014-08-08 NOTE — Progress Notes (Signed)
Subjective:    Patient ID: [name], male    DOB: 1955-08-02, 59 y.o.   MRN: [id number]  HPI 59 year old patient who has a history of a known AAA which has increased to 4.9 centimeters.  He has seen vascular surgery recently and is scheduled for an abdominal CT a next month. He has treated hypertension. Last weekend he had the onset of some abdominal pain associated with constipation.  The symptoms have resolved.  For the past 3 days he has had some left flank discomfort.  Pain is aggravated by twisting to the right at the waist and alleviated by rest.  Yesterday he lifted a heavy object at work (can of paint )and had aggravation of the left flank discomfort.  He describes some occasional nausea and dizziness.  Past Medical History  Diagnosis Date  . COLONIC POLYPS, HX OF 05/02/2007  . GERD 11/08/2006  . HYPERCHOLESTEROLEMIA, SEVERE 05/02/2007  . HYPERTENSION 11/08/2006  . LOW BACK PAIN 11/22/2007  . 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 Claustrophobia   . AAA (abdominal aortic aneurysm) without rupture   . Claudication     lower extremities  . Arthritis   . DVT (deep venous thrombosis)   . AAA (abdominal aortic aneurysm)     History   Social History  . Marital Status: Married    Spouse Name: N/A  . Number of Children: N/A  . Years of Education: N/A   Occupational History  . Not on file.   Social History Main Topics  . Smoking status: Current Every Day Smoker -- 0.50 packs/day for 30 years    Types: Cigarettes  . Smokeless tobacco: Never Used  . Alcohol Use: No  . Drug Use: No  . Sexual Activity: Not on file   Other Topics Concern  . Not on file   Social History Narrative    Past Surgical History  Procedure Laterality Date  . Mohs surgery      lower lip  . Foot surgery      right x2  . Angioplasty  09/20/10    Rt AK to tibioperoneal trunk BPG w/ vein patch angioplasty of tibioperoneal trunk   . Carpel tunnel surgery  per pt. 6-7 years ago   bilateral  (done 2 weeks apart)  . Pr vein bypass graft,aorto-fem-pop      Family History  Problem Relation Age of Onset  . Adrenal disorder Brother   . Hyperlipidemia Brother   . Hypertension Brother   . Heart disease Brother     Heart Disease before age 42  . Heart attack Brother   . Heart disease Mother     Heart Disease before age 70  . Hyperlipidemia Mother   . Hypertension Mother   . Heart attack Mother   . Heart disease Father     Heart Disease before age 50  . Hyperlipidemia Father   . Heart attack Father   . Pulmonary embolism Sister     Allergies  Allergen Reactions  . Cortisone   . Oxycodone     Severe itching  . Sulfonamide Derivatives     REACTION: rash    Current Outpatient Prescriptions on File Prior to Visit  Medication Sig Dispense Refill  . ALPRAZolam (XANAX) 0.5 MG tablet TAKE 1 TABLET BY MOUTH 2 TIMES A DAY AS NEEDED. 90 tablet 0  . aspirin 81 MG tablet Take 81 mg by mouth daily.      . benazepril (LOTENSIN) 40 MG tablet TAKE 1  TABLET BY MOUTH EVERY DAY 90 tablet 1  . hydrochlorothiazide (HYDRODIURIL) 25 MG tablet TAKE 1 TABLET BY MOUTH EVERY DAY 90 tablet 1  . Multiple Vitamin (MULTIVITAMIN) tablet Take 1 tablet by mouth daily.      Marland Kitchen omeprazole (PRILOSEC) 20 MG capsule TAKE 1 CAPSULE (20 MG TOTAL) BY MOUTH DAILY. 90 capsule 3  . potassium chloride SA (KLOR-CON M20) 20 MEQ tablet TAKE ONE TABLET BY MOUTH EVERY DAY 90 tablet 1  . pravastatin (PRAVACHOL) 40 MG tablet TAKE 2 TABLETS BY MOUTH EVERY DAY 180 tablet 0  . VITAMIN E PO Take by mouth.       No current facility-administered medications on file prior to visit.    BP 128/84 mmHg  Pulse 97  Temp(Src) 98.1 F (36.7 C) (Oral)  Resp 20  Ht 6' (1.829 m)  Wt 213 lb (96.616 kg)  BMI 28.88 kg/m2  SpO2 98%      Review of Systems  Constitutional: Negative for fever, chills, appetite change and fatigue.  HENT: Negative for congestion, dental problem, ear pain, hearing loss, sore throat,  tinnitus, trouble swallowing and voice change.   Eyes: Negative for pain, discharge and visual disturbance.  Respiratory: Negative for cough, chest tightness, wheezing and stridor.   Cardiovascular: Negative for chest pain, palpitations and leg swelling.  Gastrointestinal: Positive for nausea. Negative for vomiting, abdominal pain, diarrhea, constipation, blood in stool and abdominal distention.  Genitourinary: Positive for flank pain. Negative for urgency, hematuria, discharge, difficulty urinating and genital sores.  Musculoskeletal: Negative for myalgias, back pain, joint swelling, arthralgias, gait problem and neck stiffness.  Skin: Negative for rash.  Neurological: Negative for dizziness, syncope, speech difficulty, weakness, numbness and headaches.  Hematological: Negative for adenopathy. Does not bruise/bleed easily.  Psychiatric/Behavioral: Negative for behavioral problems and dysphoric mood. The patient is not nervous/anxious.        Objective:   Physical Exam  Constitutional:  Blood pressure 120/80 in both arms  Cardiovascular: Regular rhythm.   Both posterior tibial pulses are full Pulses.  Slow and regular  Pulmonary/Chest: Effort normal and breath sounds normal.  Abdominal: Soft. Bowel sounds are normal. He exhibits no distension. There is no tenderness. There is no rebound and no guarding.  Musculoskeletal:  Tenderness with gentle palpation in the left flank area.  Pain is aggravated by twisting at the waist to his right          Assessment & Plan:   Left flank pain.  Appears to be musculoligamentous.  Will treat with tramadol and observe.  Will call if any new or worsening symptoms Abdominal aortic aneurysm.  For further evaluation .  Next month, and possible surgery Hypertension, stable PAD.  Evaluation in progress

## 2014-08-08 NOTE — Progress Notes (Signed)
Pre visit review using our clinic review tool, if applicable. No additional management support is needed unless otherwise documented below in the visit note. 

## 2014-08-08 NOTE — Patient Instructions (Signed)
Report any new or worsening symptoms  You  may move around, but avoid painful motions and activities.  Apply heat  to the sore area for 15 to 20 minutes 3 or 4 times daily for the next two to 3 days.

## 2014-08-12 ENCOUNTER — Ambulatory Visit
Admission: RE | Admit: 2014-08-12 | Discharge: 2014-08-12 | Disposition: A | Discharge status: Home / Self Care | Payer: Managed Care, Other (non HMO) | Source: Ambulatory Visit | Attending: Vascular Surgery | Admitting: Vascular Surgery

## 2014-08-12 DIAGNOSIS — I714 Abdominal aortic aneurysm, without rupture, unspecified: Secondary | ICD-10-CM

## 2014-08-12 DIAGNOSIS — I739 Peripheral vascular disease, unspecified: Secondary | ICD-10-CM

## 2014-08-12 MED ORDER — IOPAMIDOL (ISOVUE-370) INJECTION 76%
135.0000 mL | Freq: Once | INTRAVENOUS | Status: AC | PRN
  Administered 2014-08-12: 135 mL via INTRAVENOUS

## 2014-08-18 ENCOUNTER — Encounter: Payer: Self-pay | Admitting: Vascular Surgery

## 2014-08-19 ENCOUNTER — Ambulatory Visit (INDEPENDENT_AMBULATORY_CARE_PROVIDER_SITE_OTHER): Payer: Managed Care, Other (non HMO) | Admitting: Vascular Surgery

## 2014-08-19 ENCOUNTER — Encounter: Payer: Self-pay | Admitting: Vascular Surgery

## 2014-08-19 VITALS — BP 117/75 | HR 86 | Temp 98.3°F | Resp 18 | Ht 72.0 in | Wt 211.0 lb

## 2014-08-19 DIAGNOSIS — I714 Abdominal aortic aneurysm, without rupture, unspecified: Secondary | ICD-10-CM

## 2014-08-19 DIAGNOSIS — Z48812 Encounter for surgical aftercare following surgery on the circulatory system: Secondary | ICD-10-CM | POA: Diagnosis not present

## 2014-08-19 DIAGNOSIS — I70219 Atherosclerosis of native arteries of extremities with intermittent claudication, unspecified extremity: Secondary | ICD-10-CM | POA: Diagnosis not present

## 2014-08-19 NOTE — Progress Notes (Signed)
HISTORY AND PHYSICAL     CC:  F/u for AAA and right leg bypass Referring Provider:  Marletta Lor, MD  HPI: This is a 59 y.o. male who is being followed by Dr. Donnetta Hutching for an AAA and right above knee popliteal to tibioperoneal tibial peroneal trunk in 2012 for occlusive disease.  He was Dr. Donnetta Hutching back in May at which time he was having claudication sx and these have not changed since then.  On ultrasound at that visit, he was noted to have a small increase in the size of his AAA.  He also had significant elevated velocities in the iliac arteries.  Given the growth of the aneurysm and his claudication sx, he was sent for CTA to evaluate his AAA and the right leg bypass.  He follows up today for the results.  He is not having any abdominal pain.  He does take a statin for hyperlipidemia.  He is on an ACEI for HTN.  He does take a daily aspirin.  Past Medical History  Diagnosis Date  . COLONIC POLYPS, HX OF 05/02/2007  . GERD 11/08/2006  . HYPERCHOLESTEROLEMIA, SEVERE 05/02/2007  . HYPERTENSION 11/08/2006  . LOW BACK PAIN 11/22/2007  . 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 Claustrophobia   . AAA (abdominal aortic aneurysm) without rupture   . Claudication     lower extremities  . Arthritis   . DVT (deep venous thrombosis)   . AAA (abdominal aortic aneurysm)     Past Surgical History  Procedure Laterality Date  . Mohs surgery      lower lip  . Foot surgery      right x2  . Angioplasty  09/20/10    Rt AK to tibioperoneal trunk BPG w/ vein patch angioplasty of tibioperoneal trunk   . Carpel tunnel surgery  per pt. 6-7 years ago    bilateral  (done 2 weeks apart)  . Pr vein bypass graft,aorto-fem-pop      Allergies  Allergen Reactions  . Cortisone   . Oxycodone     Severe itching  . Sulfonamide Derivatives     REACTION: rash    Current Outpatient Prescriptions  Medication Sig Dispense Refill  . ALPRAZolam (XANAX) 0.5 MG tablet TAKE 1 TABLET BY MOUTH 2 TIMES A DAY  AS NEEDED. 90 tablet 0  . aspirin 81 MG tablet Take 81 mg by mouth daily.      . benazepril (LOTENSIN) 40 MG tablet TAKE 1 TABLET BY MOUTH EVERY DAY 90 tablet 1  . hydrochlorothiazide (HYDRODIURIL) 25 MG tablet TAKE 1 TABLET BY MOUTH EVERY DAY 90 tablet 1  . Multiple Vitamin (MULTIVITAMIN) tablet Take 1 tablet by mouth daily.      Marland Kitchen omeprazole (PRILOSEC) 20 MG capsule TAKE 1 CAPSULE (20 MG TOTAL) BY MOUTH DAILY. 90 capsule 3  . potassium chloride SA (KLOR-CON M20) 20 MEQ tablet TAKE ONE TABLET BY MOUTH EVERY DAY 90 tablet 1  . pravastatin (PRAVACHOL) 40 MG tablet TAKE 2 TABLETS BY MOUTH EVERY DAY 180 tablet 0  . traMADol (ULTRAM) 50 MG tablet Take 1 tablet (50 mg total) by mouth every 6 (six) hours as needed. 30 tablet 0  . VITAMIN E PO Take by mouth.       No current facility-administered medications for this visit.    Family History  Problem Relation Age of Onset  . Adrenal disorder Brother   . Hyperlipidemia Brother   . Hypertension Brother   . Heart disease Brother  Heart Disease before age 50  . Heart attack Brother   . Heart disease Mother     Heart Disease before age 55  . Hyperlipidemia Mother   . Hypertension Mother   . Heart attack Mother   . Heart disease Father     Heart Disease before age 81  . Hyperlipidemia Father   . Heart attack Father   . Pulmonary embolism Sister     History   Social History  . Marital Status: Married    Spouse Name: N/A  . Number of Children: N/A  . Years of Education: N/A   Occupational History  . Not on file.   Social History Main Topics  . Smoking status: Current Every Day Smoker -- 0.50 packs/day for 30 years    Types: Cigarettes  . Smokeless tobacco: Never Used  . Alcohol Use: No  . Drug Use: No  . Sexual Activity: Not on file   Other Topics Concern  . Not on file   Social History Narrative     ROS: [x]  Positive   [ ]  Negative   [ ]  All sytems reviewed and are negative  Cardiovascular: []  chest  pain/pressure []  palpitations []  SOB lying flat []  DOE [x]  pain in legs while walking []  pain in feet when lying flat []  hx of DVT []  hx of phlebitis []  swelling in legs []  varicose veins  Pulmonary: []  productive cough []  asthma []  wheezing  Neurologic: []  weakness in []  arms []  legs []  numbness in []  arms []  legs [] difficulty speaking or slurred speech []  temporary loss of vision in one eye []  dizziness  Hematologic: []  bleeding problems []  problems with blood clotting easily  GI []  vomiting blood []  blood in stool [x]  GERD  GU: []  burning with urination []  blood in urine  Psychiatric: []  hx of major depression  Integumentary: []  rashes []  ulcers  Constitutional: []  fever []  chills   PHYSICAL EXAMINATION:  Filed Vitals:   08/19/14 1603  BP: 117/75  Pulse: 86  Temp: 98.3 F (36.8 C)  Resp: 18   Body mass index is 28.61 kg/(m^2).  General:  WDWN in NAD Gait: Not observed HENT: WNL, normocephalic Pulmonary: normal non-labored breathing , without Rales, rhonchi,  wheezing Skin: without rashes, without ulcers  Vascular Exam/Pulses:  Extremities: without ischemic changes, without Gangrene , without cellulitis; without open wounds;  Musculoskeletal: no muscle wasting or atrophy  Neurologic: A&O X 3; Appropriate Affect ; SENSATION: normal; MOTOR FUNCTION:  moving all extremities equally. Speech is fluent/normal   Non-Invasive Vascular Imaging:   CTA 08/12/14: IMPRESSION: VASCULAR  1. Continued slow interval enlargement of fusiform infrarenal abdominal aortic aneurysm which measures 4.8 x 4.5 cm today compared to 4.6 x 4.3 cm 1 year previously. 2. 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 3. Patent right above the knee to distal popliteal artery bypass. There appears to be at least moderate  focal narrowing in the proximal third of the conduit. 4. Three-vessel runoff remains patent to the ankle on the right. 5. Moderate focal stenosis of the left popliteal artery at the level of the tibial plateau secondary to heavily calcified plaque. 6. Heavily diseased left anterior tibial artery which appears to segmentally occluded in the lower third of the leg. 7. Small accessory renal arteries supplying the lower poles bilaterally. The accessory right renal artery  is very small and supplies no more than 10-15 percent of the right renal volume. The left accessory renal artery is moderate in size and supplies 15- 20% of the left kidney. 8. Additional multifocal mild to moderate disease as detailed above. NON VASCULAR  1. Cholelithiasis 2. Colonic diverticular disease without CT evidence of active inflammation. 3. Hepatic steatosis 4. Prostatomegaly 5. Lower lumbar facet arthropathy   Pt meds includes: Statin:  Yes.   Beta Blocker:  No. Aspirin:  Yes.   ACEI:  Yes.   ARB:  No. Other Antiplatelet/Anticoagulant:  No.    ASSESSMENT/PLAN:: 59 y.o. male with known AAA and hx of right above knee popliteal to tibioperoneal tibial peroneal trunk in 2012 for occlusive disease    -Given that his AAA is at 4.8cm, will defer repair of this at this time and recheck it again in 6 months with ultrasound.  He understands that the risk of rupture at this time is low, but possible.  Would not recommend repair until aneurysm is 5.5cm as risks outweigh benefits at this time.  We will check his iliac arteries at that time as well. -he continues to have intermittent claudication.  CTA reveals patent right above the knee to distal popliteal artery bypass.  There appears to be at least moderate focal narrowing in theproximal third of the conduit.  There is also Moderate focal stenosis of the left popliteal he does not have any non healing wounds at this time, Dr. Donnetta Hutching and pt discussed and will repeat  duplex in 6 months.  Pt will return sooner if he has non healing wounds or his symptoms worsen.   Leontine Locket, PA-C [facility] [phone]  Clinic MD:  Pt seen and examined in conjunction with Dr. Donnetta Hutching  I have examined the patient, reviewed and agree with above. Here today for discussion of his recent CT scan. This does show no significant growth in his aneurysm at 4.8 cm he is quite relieved with this. He does have 2 cm dilatation of his common iliac arteries bilaterally. On the left leg he does have patency of his femoropopliteal bypass. He does have palpable dorsalis pedis pulse. His anastomosis from the distal superficial artery to the tibial peroneal trunk appear patent. He does have slight narrowing at the mid knee behind the popliteal space.  Curt Jews, MD 08/19/2014 4:58 PM

## 2014-08-21 NOTE — Addendum Note (Signed)
Addended by: Dorthula Rue L on: 08/21/2014 09:34 AM   Modules accepted: Orders

## 2014-10-05 ENCOUNTER — Other Ambulatory Visit: Payer: Self-pay | Admitting: Internal Medicine

## 2014-12-07 ENCOUNTER — Other Ambulatory Visit: Payer: Self-pay | Admitting: Internal Medicine

## 2014-12-11 ENCOUNTER — Ambulatory Visit (INDEPENDENT_AMBULATORY_CARE_PROVIDER_SITE_OTHER): Payer: Managed Care, Other (non HMO) | Admitting: Family Medicine

## 2014-12-11 DIAGNOSIS — Z23 Encounter for immunization: Secondary | ICD-10-CM

## 2014-12-30 ENCOUNTER — Other Ambulatory Visit: Payer: Self-pay | Admitting: Internal Medicine

## 2015-01-04 ENCOUNTER — Other Ambulatory Visit: Payer: Self-pay | Admitting: Internal Medicine

## 2015-01-21 ENCOUNTER — Ambulatory Visit: Payer: Managed Care, Other (non HMO) | Admitting: Adult Health

## 2015-01-21 ENCOUNTER — Ambulatory Visit (INDEPENDENT_AMBULATORY_CARE_PROVIDER_SITE_OTHER): Payer: Managed Care, Other (non HMO) | Admitting: Adult Health

## 2015-01-21 ENCOUNTER — Encounter: Payer: Self-pay | Admitting: Adult Health

## 2015-01-21 VITALS — BP 140/70 | HR 80 | Temp 97.8°F | Wt 209.3 lb

## 2015-01-21 DIAGNOSIS — J069 Acute upper respiratory infection, unspecified: Secondary | ICD-10-CM

## 2015-01-21 LAB — POCT RAPID STREP A (OFFICE): Rapid Strep A Screen: NEGATIVE

## 2015-01-21 MED ORDER — HYDROCODONE-HOMATROPINE 5-1.5 MG/5ML PO SYRP: 5.0000 mL | ORAL_SOLUTION | Freq: Three times a day (TID) | ORAL | Status: DC | PRN

## 2015-01-21 MED ORDER — DOXYCYCLINE HYCLATE 100 MG PO CAPS: 100.0000 mg | ORAL_CAPSULE | Freq: Two times a day (BID) | ORAL | Status: DC

## 2015-01-21 NOTE — Progress Notes (Signed)
Pre visit review using our clinic review tool, if applicable. No additional management support is needed unless otherwise documented below in the visit note. 

## 2015-01-21 NOTE — Progress Notes (Signed)
Subjective:    Patient ID: [name], male    DOB: [birth year], 59 y.o.   MRN: [id number]  HPI  59 year old male who presents to the office today with the complaint of chest congestion, productive cough, sinus pain and pressure and headache with generalized malaise.  This has been an ongoing issue since Saturday. Has not been getting any better.   Has used Mucinex and Tylenol which has helped minimally.   Review of Systems  Constitutional: Positive for fatigue. Negative for fever, chills and diaphoresis.  HENT: Positive for congestion, hearing loss, rhinorrhea, sinus pressure and sore throat. Negative for ear discharge and postnasal drip.   Respiratory: Positive for cough and shortness of breath. Negative for wheezing.   Cardiovascular: Negative.   Skin: Negative.   Neurological: Positive for headaches. Negative for dizziness and light-headedness.  All other systems reviewed and are negative.  Past Medical History  Diagnosis Date  . COLONIC POLYPS, HX OF 05/02/2007  . GERD 11/08/2006  . HYPERCHOLESTEROLEMIA, SEVERE 05/02/2007  . HYPERTENSION 11/08/2006  . LOW BACK PAIN 11/22/2007  . 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 Claustrophobia   . AAA (abdominal aortic aneurysm) without rupture   . Claudication     lower extremities  . Arthritis   . DVT (deep venous thrombosis)   . AAA (abdominal aortic aneurysm)     Social History   Social History  . Marital Status: Married    Spouse Name: N/A  . Number of Children: N/A  . Years of Education: N/A   Occupational History  . Not on file.   Social History Main Topics  . Smoking status: Current Every Day Smoker -- 0.50 packs/day for 30 years    Types: Cigarettes  . Smokeless tobacco: Never Used  . Alcohol Use: No  . Drug Use: No  . Sexual Activity: Not on file   Other Topics Concern  . Not on file   Social History Narrative    Past Surgical History  Procedure Laterality Date  . Mohs surgery      lower lip    . Foot surgery      right x2  . Angioplasty  09/20/10    Rt AK to tibioperoneal trunk BPG w/ vein patch angioplasty of tibioperoneal trunk   . Carpel tunnel surgery  per pt. 6-7 years ago    bilateral  (done 2 weeks apart)  . Pr vein bypass graft,aorto-fem-pop      Family History  Problem Relation Age of Onset  . Adrenal disorder Brother   . Hyperlipidemia Brother   . Hypertension Brother   . Heart disease Brother     Heart Disease before age 47  . Heart attack Brother   . Heart disease Mother     Heart Disease before age 84  . Hyperlipidemia Mother   . Hypertension Mother   . Heart attack Mother   . Heart disease Father     Heart Disease before age 73  . Hyperlipidemia Father   . Heart attack Father   . Pulmonary embolism Sister     Allergies  Allergen Reactions  . Cortisone   . Oxycodone     Severe itching  . Sulfonamide Derivatives     REACTION: rash    Current Outpatient Prescriptions on File Prior to Visit  Medication Sig Dispense Refill  . ALPRAZolam (XANAX) 0.5 MG tablet Take 1 tablet (0.5 mg total) by mouth 2 (two) times daily as needed for anxiety. [place]  tablet 1  . aspirin 81 MG tablet Take 81 mg by mouth daily.      . benazepril (LOTENSIN) 40 MG tablet TAKE 1 TABLET BY MOUTH EVERY DAY 90 tablet 1  . hydrochlorothiazide (HYDRODIURIL) 25 MG tablet TAKE 1 TABLET BY MOUTH EVERY DAY 90 tablet 2  . Multiple Vitamin (MULTIVITAMIN) tablet Take 1 tablet by mouth daily.      Marland Kitchen omeprazole (PRILOSEC) 20 MG capsule TAKE 1 CAPSULE (20 MG TOTAL) BY MOUTH DAILY. 90 capsule 3  . potassium chloride SA (KLOR-CON M20) 20 MEQ tablet TAKE ONE TABLET BY MOUTH EVERY DAY 90 tablet 1  . pravastatin (PRAVACHOL) 40 MG tablet TAKE 2 TABLETS BY MOUTH EVERY DAY 180 tablet 0  . traMADol (ULTRAM) 50 MG tablet Take 1 tablet (50 mg total) by mouth every 6 (six) hours as needed. 30 tablet 0  . VITAMIN E PO Take by mouth.       No current facility-administered medications on file prior to  visit.    BP 140/70 mmHg  Pulse 80  Temp(Src) 97.8 F (36.6 C) (Oral)  Wt 209 lb 4.8 oz (94.938 kg)        Objective:   Physical Exam  Constitutional: He is oriented to person, place, and time. He appears well-developed and well-nourished. He appears distressed (tired and worn out).  HENT:  Head: Normocephalic and atraumatic.  Right Ear: External ear normal.  Left Ear: External ear normal.  Nose: Nose normal.  Mouth/Throat: Oropharynx is clear and moist. No oropharyngeal exudate.  Frontal and maxillary sinus pressure with palpation  Neck: Normal range of motion. Neck supple.  Cardiovascular: Normal rate, regular rhythm, normal heart sounds and intact distal pulses.  Exam reveals no gallop and no friction rub.   No murmur heard. Pulmonary/Chest: Effort normal and breath sounds normal. No respiratory distress. He has no wheezes. He has no rales. He exhibits no tenderness.  Musculoskeletal: Normal range of motion. He exhibits no edema or tenderness.  Lymphadenopathy:    He has cervical adenopathy.  Neurological: He is alert and oriented to person, place, and time.  Skin: Skin is warm and dry. No rash noted. He is not diaphoretic. No erythema. No pallor.  Psychiatric: He has a normal mood and affect. His behavior is normal. Judgment and thought content normal.  Nursing note and vitals reviewed.      Assessment & Plan:  1. Acute upper respiratory infection - Lungs sounds were clear, he has no fever, I have a low suspicion for CAP - doxycycline (VIBRAMYCIN) 100 MG capsule; Take 1 capsule (100 mg total) by mouth 2 (two) times daily.  Dispense: 14 capsule; Refill: 0 - HYDROcodone-homatropine (HYCODAN) 5-1.5 MG/5ML syrup; Take 5 mLs by mouth every 8 (eight) hours as needed for cough.  Dispense: 120 mL; Refill: 0 - Follow up in 2-3 days

## 2015-01-21 NOTE — Patient Instructions (Signed)
It was great meeting you today! I am sorry you are feeling to horrible.   I have sent in a prescription for Doxycycline 100mg  to the pharmacy. Take this twice a day for 7 days.   Use the cough syrup at night as it will make you sleepy.   Keep hydrated and continue to take Mucinex.

## 2015-01-21 NOTE — Addendum Note (Signed)
Addended by: Ailene Rud E on: 01/21/2015 04:19 PM   Modules accepted: Orders

## 2015-02-02 ENCOUNTER — Other Ambulatory Visit: Payer: Self-pay | Admitting: Internal Medicine

## 2015-02-18 ENCOUNTER — Encounter: Payer: Self-pay | Admitting: Vascular Surgery

## 2015-02-24 ENCOUNTER — Ambulatory Visit (HOSPITAL_COMMUNITY)
Admission: RE | Admit: 2015-02-24 | Discharge: 2015-02-24 | Disposition: A | Discharge status: Home / Self Care | Payer: Managed Care, Other (non HMO) | Source: Ambulatory Visit | Attending: Vascular Surgery | Admitting: Vascular Surgery

## 2015-02-24 ENCOUNTER — Encounter: Payer: Self-pay | Admitting: Vascular Surgery

## 2015-02-24 ENCOUNTER — Ambulatory Visit (INDEPENDENT_AMBULATORY_CARE_PROVIDER_SITE_OTHER): Payer: Managed Care, Other (non HMO) | Admitting: Vascular Surgery

## 2015-02-24 ENCOUNTER — Ambulatory Visit (INDEPENDENT_AMBULATORY_CARE_PROVIDER_SITE_OTHER)
Admission: RE | Admit: 2015-02-24 | Discharge: 2015-02-24 | Disposition: A | Discharge status: Home / Self Care | Payer: Managed Care, Other (non HMO) | Source: Ambulatory Visit | Attending: Vascular Surgery | Admitting: Vascular Surgery

## 2015-02-24 VITALS — BP 127/81 | HR 80 | Temp 98.2°F | Ht 72.0 in | Wt 207.7 lb

## 2015-02-24 DIAGNOSIS — Z48812 Encounter for surgical aftercare following surgery on the circulatory system: Secondary | ICD-10-CM | POA: Diagnosis not present

## 2015-02-24 DIAGNOSIS — I714 Abdominal aortic aneurysm, without rupture, unspecified: Secondary | ICD-10-CM

## 2015-02-24 DIAGNOSIS — I70219 Atherosclerosis of native arteries of extremities with intermittent claudication, unspecified extremity: Secondary | ICD-10-CM | POA: Diagnosis not present

## 2015-02-24 DIAGNOSIS — I10 Essential (primary) hypertension: Secondary | ICD-10-CM | POA: Insufficient documentation

## 2015-02-24 DIAGNOSIS — I739 Peripheral vascular disease, unspecified: Secondary | ICD-10-CM

## 2015-02-24 DIAGNOSIS — E7800 Pure hypercholesterolemia, unspecified: Secondary | ICD-10-CM | POA: Diagnosis not present

## 2015-02-24 NOTE — Addendum Note (Signed)
Addended by: Dorthula Rue L on: 02/24/2015 03:54 PM   Modules accepted: Orders

## 2015-02-24 NOTE — Progress Notes (Signed)
Vascular and Vein Specialist of [facility]  Patient name: [name] MRN: [id number] DOB: 1955/03/16 Sex: male  REASON FOR VISIT: Follow-up of abdominal aortic aneurysm and bilateral common iliac artery aneurysms. Also follow-up of right femoral-popliteal bypass  HPI: [name] is a 59 y.o. male status post above-knee to below-knee popliteal bypass in July 2012. He reports some right calf claudication which is tolerable currently. He reports is been no change in this. He also reports chronic bilateral lower extremity pain which may be related to lower back pathology. He has known aortic and bilateral iliac artery aneurysms.  Past Medical History  Diagnosis Date  . COLONIC POLYPS, HX OF 05/02/2007  . GERD 11/08/2006  . HYPERCHOLESTEROLEMIA, SEVERE 05/02/2007  . HYPERTENSION 11/08/2006  . LOW BACK PAIN 11/22/2007  . 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 Claustrophobia   . AAA (abdominal aortic aneurysm) without rupture ([place])   . Claudication [facility])     lower extremities  . Arthritis   . DVT (deep venous thrombosis) ([place])   . AAA (abdominal aortic aneurysm) ([facility])     Family History  Problem Relation Age of Onset  . Adrenal disorder Brother   . Hyperlipidemia Brother   . Hypertension Brother   . Heart disease Brother     Heart Disease before age 5  . Heart attack Brother   . Heart disease Mother     Heart Disease before age 62  . Hyperlipidemia Mother   . Hypertension Mother   . Heart attack Mother   . Heart disease Father     Heart Disease before age 27  . Hyperlipidemia Father   . Heart attack Father   . Pulmonary embolism Sister     SOCIAL HISTORY: Social History  Substance Use Topics  . Smoking status: Current Every Day Smoker -- 0.50 packs/day for 30 years    Types: Cigarettes  . Smokeless tobacco: Never Used  . Alcohol Use: No    Allergies  Allergen Reactions  . Cortisone   . Oxycodone     Severe itching  . Sulfonamide Derivatives    REACTION: rash    Current Outpatient Prescriptions  Medication Sig Dispense Refill  . ALPRAZolam (XANAX) 0.5 MG tablet Take 1 tablet (0.5 mg total) by mouth 2 (two) times daily as needed for anxiety. 180 tablet 1  . aspirin 81 MG tablet Take 81 mg by mouth daily.      . benazepril (LOTENSIN) 40 MG tablet TAKE 1 TABLET BY MOUTH EVERY DAY 90 tablet 1  . hydrochlorothiazide (HYDRODIURIL) 25 MG tablet TAKE 1 TABLET BY MOUTH EVERY DAY 90 tablet 2  . Multiple Vitamin (MULTIVITAMIN) tablet Take 1 tablet by mouth daily.      Marland Kitchen omeprazole (PRILOSEC) 20 MG capsule TAKE 1 CAPSULE (20 MG TOTAL) BY MOUTH DAILY. 90 capsule 3  . potassium chloride SA (KLOR-CON M20) 20 MEQ tablet TAKE ONE TABLET BY MOUTH EVERY DAY 90 tablet 1  . pravastatin (PRAVACHOL) 40 MG tablet TAKE 2 TABLETS BY MOUTH EVERY DAY 180 tablet 0  . traMADol (ULTRAM) 50 MG tablet Take 1 tablet (50 mg total) by mouth every 6 (six) hours as needed. 30 tablet 0  . VITAMIN E PO Take by mouth.      . doxycycline (VIBRAMYCIN) 100 MG capsule Take 1 capsule (100 mg total) by mouth 2 (two) times daily. (Patient not taking: Reported on 02/24/2015) 14 capsule 0  . HYDROcodone-homatropine (HYCODAN) 5-1.5 MG/5ML syrup Take 5 mLs by mouth  every 8 (eight) hours as needed for cough. (Patient not taking: Reported on 02/24/2015) 120 mL 0   No current facility-administered medications for this visit.    REVIEW OF SYSTEMS:  [X]  denotes positive finding, [ ]  denotes negative finding Cardiac  Comments:  Chest pain or chest pressure:    Shortness of breath upon exertion:    Short of breath when lying flat:    Irregular heart rhythm:        Vascular    Pain in calf, thigh, or hip brought on by ambulation: x   Pain in feet at night that wakes you up from your sleep:  x   Blood clot in your veins:    Leg swelling:         Pulmonary    Oxygen at home:    Productive cough:     Wheezing:         Neurologic    Sudden weakness in arms or legs:  x   Sudden  numbness in arms or legs:  x   Sudden onset of difficulty speaking or slurred speech:    Temporary loss of vision in one eye:     Problems with dizziness:         Gastrointestinal    Blood in stool:     Vomited blood:         Genitourinary    Burning when urinating:     Blood in urine:        Psychiatric    Major depression:         Hematologic    Bleeding problems:    Problems with blood clotting too easily:        Skin    Rashes or ulcers:        Constitutional    Fever or chills:      PHYSICAL EXAM: Filed Vitals:   02/24/15 0952  BP: 127/81  Pulse: 80  Temp: 98.2 F (36.8 C)  TempSrc: Oral  Height: 6' (1.829 m)  Weight: 207 lb 11.2 oz (94.212 kg)  SpO2: 95%    GENERAL: The patient is a well-nourished male, in no acute distress. The vital signs are documented above. CARDIAC: There is a regular rate and rhythm.  VASCULAR: 2+ radial 2+ femoral and 2+ dorsalis pedis pulses bilaterally PULMONARY: There is good air exchange bilaterally without wheezing or rales. ABDOMEN: Soft and non-tender with normal pitched bowel sounds. No aneurysm palpable MUSCULOSKELETAL: There are no major deformities or cyanosis. NEUROLOGIC: No focal weakness or paresthesias are detected. SKIN: There are no ulcers or rashes noted. PSYCHIATRIC: The patient has a normal affect.  DATA:  Noninvasive vascular studies today reveal normal ankle arm index bilaterally. He does have somewhat elevated velocities in the mid vein graft from his above-knee to below-knee popliteal bypass on the right.  Aortic duplex shows no change in maximal diameter of 4.7 cm. His right common iliac artery is 1.85 and his left common iliac artery is 2.07 cm  MEDICAL ISSUES: Stable claudication related to his femoropopliteal bypass. He wishes to continue observation only related to this. I will explain that at his next appointment the arteriogram if he wished to proceed further. Also understands that we will continue to  watch his aorta and bilateral common iliac artery at 6 month intervals to rule out expansion. No Follow-up on file.   Curt Jews [facility] [place]: [phone]

## 2015-02-27 ENCOUNTER — Other Ambulatory Visit: Payer: Self-pay | Admitting: Internal Medicine

## 2015-05-10 ENCOUNTER — Other Ambulatory Visit: Payer: Self-pay | Admitting: Internal Medicine

## 2015-06-24 ENCOUNTER — Other Ambulatory Visit: Payer: Self-pay | Admitting: Internal Medicine

## 2015-07-22 ENCOUNTER — Encounter: Payer: Self-pay | Admitting: Internal Medicine

## 2015-07-22 ENCOUNTER — Ambulatory Visit (INDEPENDENT_AMBULATORY_CARE_PROVIDER_SITE_OTHER)
Admission: RE | Admit: 2015-07-22 | Discharge: 2015-07-22 | Disposition: A | Discharge status: Home / Self Care | Payer: Managed Care, Other (non HMO) | Source: Ambulatory Visit | Attending: Internal Medicine | Admitting: Internal Medicine

## 2015-07-22 ENCOUNTER — Ambulatory Visit (INDEPENDENT_AMBULATORY_CARE_PROVIDER_SITE_OTHER): Payer: Managed Care, Other (non HMO) | Admitting: Internal Medicine

## 2015-07-22 VITALS — BP 120/80 | HR 69 | Temp 98.4°F | Resp 20 | Ht 72.0 in | Wt 211.0 lb

## 2015-07-22 DIAGNOSIS — I10 Essential (primary) hypertension: Secondary | ICD-10-CM

## 2015-07-22 DIAGNOSIS — J208 Acute bronchitis due to other specified organisms: Secondary | ICD-10-CM

## 2015-07-22 MED ORDER — BENZONATATE 200 MG PO CAPS: 200.0000 mg | ORAL_CAPSULE | Freq: Two times a day (BID) | ORAL | Status: DC | PRN

## 2015-07-22 NOTE — Progress Notes (Signed)
Pre visit review using our clinic review tool, if applicable. No additional management support is needed unless otherwise documented below in the visit note. 

## 2015-07-22 NOTE — Progress Notes (Signed)
Subjective:    Patient ID: [name], male    DOB: April 02, 1955, 60 y.o.   MRN: [id number]  HPI 60 year old patient who has been ill for 1 week.  He has had cough, chest congestion, sore throat, intermittent low grade fever.  3 days ago he had increasing chest tightness and worsening cough.  He's had no fever for the past 3 days.  He has been using Robitussin. His wife has had a similar illness for about 2 weeks. He is concerned about pneumonia  Past Medical History  Diagnosis Date  . COLONIC POLYPS, HX OF 05/02/2007  . GERD 11/08/2006  . HYPERCHOLESTEROLEMIA, SEVERE 05/02/2007  . HYPERTENSION 11/08/2006  . LOW BACK PAIN 11/22/2007  . 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 Claustrophobia   . AAA (abdominal aortic aneurysm) without rupture ([place])   . Claudication [facility])     lower extremities  . Arthritis   . DVT (deep venous thrombosis) ([place])   . AAA (abdominal aortic aneurysm) [facility])      Social History   Social History  . Marital Status: Married    Spouse Name: N/A  . Number of Children: N/A  . Years of Education: N/A   Occupational History  . Not on file.   Social History Main Topics  . Smoking status: Current Every Day Smoker -- 0.50 packs/day for 30 years    Types: Cigarettes  . Smokeless tobacco: Never Used  . Alcohol Use: No  . Drug Use: No  . Sexual Activity: Not on file   Other Topics Concern  . Not on file   Social History Narrative    Past Surgical History  Procedure Laterality Date  . Mohs surgery      lower lip  . Foot surgery      right x2  . Angioplasty  09/20/10    Rt AK to tibioperoneal trunk BPG w/ vein patch angioplasty of tibioperoneal trunk   . Carpel tunnel surgery  per pt. 6-7 years ago    bilateral  (done 2 weeks apart)  . Pr vein bypass graft,aorto-fem-pop      Family History  Problem Relation Age of Onset  . Adrenal disorder Brother   . Hyperlipidemia Brother   . Hypertension Brother   . Heart disease Brother     Heart  Disease before age 76  . Heart attack Brother   . Heart disease Mother     Heart Disease before age 53  . Hyperlipidemia Mother   . Hypertension Mother   . Heart attack Mother   . Heart disease Father     Heart Disease before age 70  . Hyperlipidemia Father   . Heart attack Father   . Pulmonary embolism Sister     Allergies  Allergen Reactions  . Cortisone   . Oxycodone     Severe itching  . Sulfonamide Derivatives     REACTION: rash    Current Outpatient Prescriptions on File Prior to Visit  Medication Sig Dispense Refill  . ALPRAZolam (XANAX) 0.5 MG tablet TAKE 1 TABLET TWICE A DAY AS NEEDED 180 tablet 0  . aspirin 81 MG tablet Take 81 mg by mouth daily.      . benazepril (LOTENSIN) 40 MG tablet TAKE 1 TABLET BY MOUTH EVERY DAY 90 tablet 1  . doxycycline (VIBRAMYCIN) 100 MG capsule Take 1 capsule (100 mg total) by mouth 2 (two) times daily. 14 capsule 0  . hydrochlorothiazide (HYDRODIURIL) 25 MG tablet TAKE 1 TABLET BY  MOUTH EVERY DAY 90 tablet 2  . Multiple Vitamin (MULTIVITAMIN) tablet Take 1 tablet by mouth daily.      Marland Kitchen omeprazole (PRILOSEC) 20 MG capsule TAKE 1 CAPSULE (20 MG TOTAL) BY MOUTH DAILY. 90 capsule 0  . potassium chloride SA (KLOR-CON M20) 20 MEQ tablet TAKE ONE TABLET BY MOUTH EVERY DAY 90 tablet 1  . pravastatin (PRAVACHOL) 40 MG tablet TAKE 2 TABLETS BY MOUTH EVERY DAY 180 tablet 0  . traMADol (ULTRAM) 50 MG tablet Take 1 tablet (50 mg total) by mouth every 6 (six) hours as needed. 30 tablet 0  . VITAMIN E PO Take by mouth.       No current facility-administered medications on file prior to visit.    BP 120/80 mmHg  Pulse 69  Temp(Src) 98.4 F (36.9 C) (Oral)  Resp 20  Ht 6' (1.829 m)  Wt 211 lb (95.709 kg)  BMI 28.61 kg/m2  SpO2 98%      Review of Systems  Constitutional: Positive for fever, activity change, appetite change and fatigue. Negative for chills.  HENT: Positive for congestion and sore throat. Negative for dental problem, ear  pain, hearing loss, tinnitus, trouble swallowing and voice change.   Eyes: Negative for pain, discharge and visual disturbance.  Respiratory: Positive for cough. Negative for chest tightness, wheezing and stridor.   Cardiovascular: Negative for chest pain, palpitations and leg swelling.  Gastrointestinal: Negative for nausea, vomiting, abdominal pain, diarrhea, constipation, blood in stool and abdominal distention.  Genitourinary: Negative for urgency, hematuria, flank pain, discharge, difficulty urinating and genital sores.  Musculoskeletal: Negative for myalgias, back pain, joint swelling, arthralgias, gait problem and neck stiffness.  Skin: Negative for rash.  Neurological: Negative for dizziness, syncope, speech difficulty, weakness, numbness and headaches.  Hematological: Negative for adenopathy. Does not bruise/bleed easily.  Psychiatric/Behavioral: Negative for behavioral problems and dysphoric mood. The patient is not nervous/anxious.        Objective:   Physical Exam  Constitutional: He is oriented to person, place, and time. He appears well-developed.  Afebrile Appears unwell No acute distress  O2 sats ration 98%  HENT:  Head: Normocephalic.  Right Ear: External ear normal.  Left Ear: External ear normal.  Eyes: Conjunctivae and EOM are normal.  Neck: Normal range of motion.  Cardiovascular: Normal rate and normal heart sounds.   Pulmonary/Chest: Breath sounds normal.  Possibly a few crackles right base  Abdominal: Bowel sounds are normal.  Musculoskeletal: Normal range of motion. He exhibits no edema or tenderness.  Neurological: He is alert and oriented to person, place, and time.  Psychiatric: He has a normal mood and affect. His behavior is normal.          Assessment & Plan:   Viral URI with cough. Patient very concerned about possible CAPD.  Will review a chest x-ray. Treat  Symptoms aggressively

## 2015-07-22 NOTE — Patient Instructions (Signed)
Acute bronchitis symptoms  are generally not helped by antibiotics.  Take over-the-counter expectorants and cough medications such as  Mucinex DM.  Call if there is no improvement in 5 to 7 days or if  you develop worsening cough, fever, or new symptoms, such as shortness of breath or chest pain.  Drink as much fluid as you  can tolerate over the next few days Chest x-ray as discussed

## 2015-08-02 ENCOUNTER — Other Ambulatory Visit: Payer: Self-pay | Admitting: Internal Medicine

## 2015-08-30 ENCOUNTER — Other Ambulatory Visit: Payer: Self-pay | Admitting: Internal Medicine

## 2015-09-05 ENCOUNTER — Other Ambulatory Visit: Payer: Self-pay | Admitting: Internal Medicine

## 2015-09-11 ENCOUNTER — Encounter: Payer: Self-pay | Admitting: Vascular Surgery

## 2015-09-22 ENCOUNTER — Encounter: Payer: Self-pay | Admitting: Vascular Surgery

## 2015-09-22 ENCOUNTER — Ambulatory Visit (INDEPENDENT_AMBULATORY_CARE_PROVIDER_SITE_OTHER)
Admission: RE | Admit: 2015-09-22 | Discharge: 2015-09-22 | Disposition: A | Discharge status: Home / Self Care | Payer: Managed Care, Other (non HMO) | Source: Ambulatory Visit | Attending: Vascular Surgery | Admitting: Vascular Surgery

## 2015-09-22 ENCOUNTER — Ambulatory Visit (HOSPITAL_COMMUNITY)
Admission: RE | Admit: 2015-09-22 | Discharge: 2015-09-22 | Disposition: A | Discharge status: Home / Self Care | Payer: Managed Care, Other (non HMO) | Source: Ambulatory Visit | Attending: Vascular Surgery | Admitting: Vascular Surgery

## 2015-09-22 ENCOUNTER — Encounter: Payer: Self-pay | Admitting: Internal Medicine

## 2015-09-22 ENCOUNTER — Ambulatory Visit (INDEPENDENT_AMBULATORY_CARE_PROVIDER_SITE_OTHER): Payer: Managed Care, Other (non HMO) | Admitting: Internal Medicine

## 2015-09-22 ENCOUNTER — Ambulatory Visit (INDEPENDENT_AMBULATORY_CARE_PROVIDER_SITE_OTHER): Payer: Managed Care, Other (non HMO) | Admitting: Vascular Surgery

## 2015-09-22 VITALS — BP 112/71 | HR 80 | Temp 98.0°F | Ht 72.0 in | Wt 206.6 lb

## 2015-09-22 VITALS — BP 118/70 | HR 91 | Temp 98.4°F | Resp 20 | Ht 72.0 in | Wt 206.0 lb

## 2015-09-22 DIAGNOSIS — E7800 Pure hypercholesterolemia, unspecified: Secondary | ICD-10-CM | POA: Diagnosis not present

## 2015-09-22 DIAGNOSIS — I10 Essential (primary) hypertension: Secondary | ICD-10-CM | POA: Insufficient documentation

## 2015-09-22 DIAGNOSIS — N400 Enlarged prostate without lower urinary tract symptoms: Secondary | ICD-10-CM | POA: Insufficient documentation

## 2015-09-22 DIAGNOSIS — R300 Dysuria: Secondary | ICD-10-CM

## 2015-09-22 DIAGNOSIS — I714 Abdominal aortic aneurysm, without rupture, unspecified: Secondary | ICD-10-CM

## 2015-09-22 DIAGNOSIS — I739 Peripheral vascular disease, unspecified: Secondary | ICD-10-CM

## 2015-09-22 DIAGNOSIS — K219 Gastro-esophageal reflux disease without esophagitis: Secondary | ICD-10-CM | POA: Diagnosis not present

## 2015-09-22 DIAGNOSIS — N401 Enlarged prostate with lower urinary tract symptoms: Secondary | ICD-10-CM | POA: Insufficient documentation

## 2015-09-22 DIAGNOSIS — T82858A Stenosis of vascular prosthetic devices, implants and grafts, initial encounter: Secondary | ICD-10-CM | POA: Diagnosis not present

## 2015-09-22 DIAGNOSIS — Y832 Surgical operation with anastomosis, bypass or graft as the cause of abnormal reaction of the patient, or of later complication, without mention of misadventure at the time of the procedure: Secondary | ICD-10-CM | POA: Diagnosis not present

## 2015-09-22 MED ORDER — DOXYCYCLINE HYCLATE 100 MG PO CAPS: 100.0000 mg | ORAL_CAPSULE | Freq: Two times a day (BID) | ORAL | Status: DC

## 2015-09-22 MED ORDER — TAMSULOSIN HCL 0.4 MG PO CAPS: 0.4000 mg | ORAL_CAPSULE | Freq: Every day | ORAL | Status: DC

## 2015-09-22 NOTE — Progress Notes (Signed)
Vascular and Vein Specialist of [facility]  Patient name: [name] MRN: [id number] DOB: 1956-02-15 Sex: male  REASON FOR VISIT: Follow-up of abdominal aortic aneurysm and bilateral common iliac arteries. 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 male with known foreign half centimeter aneurysm in 2 cm iliac aneurysms bilaterally. Also with lower from the claudication. There today for follow-up. He does report that limiting claudication in his left thigh. Has less degree of claudication in his right calf. Does have a history of prior above-knee to below-knee popliteal bypass on the right which is patent with known moderate stenosis.  Past Medical History  Diagnosis Date  . COLONIC POLYPS, HX OF 05/02/2007  . GERD 11/08/2006  . HYPERCHOLESTEROLEMIA, SEVERE 05/02/2007  . HYPERTENSION 11/08/2006  . LOW BACK PAIN 11/22/2007  . 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 Claustrophobia   . AAA (abdominal aortic aneurysm) without rupture ([place])   . Claudication [facility])     lower extremities  . Arthritis   . DVT (deep venous thrombosis) ([place])   . AAA (abdominal aortic aneurysm) ([facility])     Family History  Problem Relation Age of Onset  . Adrenal disorder Brother   . Hyperlipidemia Brother   . Hypertension Brother   . Heart disease Brother     Heart Disease before age 13  . Heart attack Brother   . Heart disease Mother     Heart Disease before age 69  . Hyperlipidemia Mother   . Hypertension Mother   . Heart attack Mother   . Heart disease Father     Heart Disease before age 34  . Hyperlipidemia Father   . Heart attack Father   . Pulmonary embolism Sister     SOCIAL HISTORY: Social History  Substance Use Topics  . Smoking status: Current Every Day Smoker -- 0.50 packs/day for 30 years    Types: Cigarettes  . Smokeless tobacco: Never Used  . Alcohol Use: No    Allergies    Allergen Reactions  . Cortisone   . Oxycodone     Severe itching  . Sulfonamide Derivatives     REACTION: rash    Current Outpatient Prescriptions  Medication Sig Dispense Refill  . ALPRAZolam (XANAX) 0.5 MG tablet TAKE 1 TABLET TWICE A DAY AS NEEDED 180 tablet 0  . aspirin 81 MG tablet Take 81 mg by mouth daily.      . benazepril (LOTENSIN) 40 MG tablet TAKE 1 TABLET BY MOUTH EVERY DAY 90 tablet 1  . benzonatate (TESSALON) 200 MG capsule Take 1 capsule (200 mg total) by mouth 2 (two) times daily as needed for cough. 40 capsule 0  . doxycycline (VIBRAMYCIN) 100 MG capsule Take 1 capsule (100 mg total) by mouth 2 (two) times daily. 14 capsule 0  . hydrochlorothiazide (HYDRODIURIL) 25 MG tablet TAKE 1 TABLET BY MOUTH EVERY DAY 90 tablet 1  . Multiple Vitamin (MULTIVITAMIN) tablet Take 1 tablet by mouth daily.      Marland Kitchen omeprazole (PRILOSEC) 20 MG capsule TAKE 1 CAPSULE (20 MG TOTAL) BY MOUTH DAILY. 90 capsule 0  . omeprazole (PRILOSEC) 20 MG capsule TAKE 1 CAPSULE (20 MG TOTAL) BY MOUTH DAILY. 90 capsule 1  . potassium chloride SA (KLOR-CON M20) 20 MEQ tablet TAKE ONE TABLET BY MOUTH EVERY DAY 90 tablet 1  . pravastatin (PRAVACHOL) 40 MG tablet TAKE 2 TABLETS BY MOUTH EVERY DAY 180 tablet 1  .  traMADol (ULTRAM) 50 MG tablet Take 1 tablet (50 mg total) by mouth every 6 (six) hours as needed. 30 tablet 0  . VITAMIN E PO Take by mouth.       No current facility-administered medications for this visit.    REVIEW OF SYSTEMS:  [X]  denotes positive finding, [ ]  denotes negative finding Cardiac  Comments:  Chest pain or chest pressure:    Shortness of breath upon exertion:    Short of breath when lying flat:    Irregular heart rhythm:        Vascular    Pain in calf, thigh, or hip brought on by ambulation:    Pain in feet at night that wakes you up from your sleep:     Blood clot in your veins:    Leg swelling:         Pulmonary    Oxygen at home:    Productive cough:     Wheezing:          Neurologic    Sudden weakness in arms or legs:     Sudden numbness in arms or legs:     Sudden onset of difficulty speaking or slurred speech:    Temporary loss of vision in one eye:     Problems with dizziness:         Gastrointestinal    Blood in stool:     Vomited blood:         Genitourinary    Burning when urinating:     Blood in urine:        Psychiatric    Major depression:         Hematologic    Bleeding problems:    Problems with blood clotting too easily:        Skin    Rashes or ulcers:        Constitutional    Fever or chills:      PHYSICAL EXAM: Filed Vitals:   09/22/15 0939  BP: 112/71  Pulse: 80  Temp: 98 F (36.7 C)  TempSrc: Oral  Height: 6' (1.829 m)  Weight: 206 lb 9.6 oz (93.713 kg)  SpO2: 99%    GENERAL: The patient is a well-nourished male, in no acute distress. The vital signs are documented above. CARDIAC: There is a regular rate and rhythm.  VASCULAR: Palpable femoral pulses bilaterally. Palpable dorsalis pedis pulses bilaterally PULMONARY: There is good air exchange bilaterally without wheezing or rales. ABDOMEN: Soft and non-tender with normal pitched bowel sounds. No aneurysm palpated MUSCULOSKELETAL: There are no major deformities or cyanosis. NEUROLOGIC: No focal weakness or paresthesias are detected. SKIN: There are no ulcers or rashes noted. PSYCHIATRIC: The patient has a normal affect.  DATA:  To place reveals no significant change in his aneurysm size at 4.8 cm also has a no significant change in his bilateral common iliac artery at 2.3 and 2.4 cm left and right respectively. Lower extremity ankle arm indices are normal bilaterally. He does have a triphasic signal on the right and biphasic on the left. He does have a known mildly elevated velocities in his right above-knee to below-knee popliteal bypass  MEDICAL ISSUES: Discussed this at length with his patient and his wife present. Recommend 6 month interval follow-up  of his aorta and iliac artery aneurysms with ultrasound. Again described the symptoms of leaking aneurysm he knows report immediately to the emergency room should this occur. I also discussed the significance of his claudication. Splane would  require arteriography for further evaluation to determine what would be available for treatment. He reports that he is able to tolerate this current level claudication which is continued observation only. I feel this is appropriate explained this in no way isn't limb threatening. Will see him again in 6 months for continued follow-up    [name], MD [facility] and Vein Specialists of [facility] Tel [phone] Pager [phone]

## 2015-09-22 NOTE — Patient Instructions (Signed)
Benign Prostatic Hyperplasia An enlarged prostate (benign prostatic hyperplasia) is common in older men. You may experience the following:  Weak urine stream.  Dribbling.  Feeling like the bladder has not emptied completely.  Difficulty starting urination.  Getting up frequently at night to urinate.  Urinating more frequently during the day. HOME CARE INSTRUCTIONS  Monitor your prostatic hyperplasia for any changes. The following actions may help to alleviate any discomfort you are experiencing:  Give yourself time when you urinate.  Stay away from alcohol.  Avoid beverages containing caffeine, such as coffee, tea, and colas, because they can make the problem worse.  Avoid decongestants, antihistamines, and some prescription medicines that can make the problem worse.  Follow up with your health care provider for further treatment as recommended. SEEK MEDICAL CARE IF:  You are experiencing progressive difficulty voiding.  Your urine stream is progressively getting narrower.  You are awaking from sleep with the urge to void more frequently.  You are constantly feeling the need to void.  You experience loss of urine, especially in small amounts. SEEK IMMEDIATE MEDICAL CARE IF:   You develop increased pain with urination or are unable to urinate.  You develop severe abdominal pain, vomiting, a high fever, or fainting.  You develop back pain or blood in your urine. MAKE SURE YOU:   Understand these instructions.  Will watch your condition.  Will get help right away if you are not doing well or get worse.   This information is not intended to replace advice given to you by your health care provider. Make sure you discuss any questions you have with your health care provider.   Document Released: 02/28/2005 Document Revised: 03/21/2014 Document Reviewed: 07/31/2012 Elsevier Interactive Patient Education 2016 [employer]  

## 2015-09-22 NOTE — Progress Notes (Signed)
Pre visit review using our clinic review tool, if applicable. No additional management support is needed unless otherwise documented below in the visit note. 

## 2015-09-22 NOTE — Progress Notes (Signed)
Subjective:    Patient ID: [name], male    DOB: October 03, 1955, 60 y.o.   MRN: [id number]  HPI  60 year old patient who has essential hypertension.  He has had follow-up with vascular surgery earlier today.  He presents with a three-day history of burning dysuria.  Denies any urethral discharge or frequency.  Does have some urgency.  Over the weekend he had fever as high as 101.3.  No history of prostatitis in the past.  He has a long history of slow urine flow.  Past Medical History  Diagnosis Date  . COLONIC POLYPS, HX OF 05/02/2007  . GERD 11/08/2006  . HYPERCHOLESTEROLEMIA, SEVERE 05/02/2007  . HYPERTENSION 11/08/2006  . LOW BACK PAIN 11/22/2007  . 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 Claustrophobia   . AAA (abdominal aortic aneurysm) without rupture ([place])   . 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 Arthritis   . DVT (deep venous thrombosis) ([place])   . AAA (abdominal aortic aneurysm) [facility])      Social History   Social History  . Marital Status: Married    Spouse Name: N/A  . Number of Children: N/A  . Years of Education: N/A   Occupational History  . Not on file.   Social History Main Topics  . Smoking status: Current Every Day Smoker -- 0.50 packs/day for 30 years    Types: Cigarettes  . Smokeless tobacco: Never Used  . Alcohol Use: No  . Drug Use: No  . Sexual Activity: Not on file   Other Topics Concern  . Not on file   Social History Narrative    Past Surgical History  Procedure Laterality Date  . Mohs surgery      lower lip  . Foot surgery      right x2  . Angioplasty  09/20/10    Rt AK to tibioperoneal trunk BPG w/ vein patch angioplasty of tibioperoneal trunk   . Carpel tunnel surgery  per pt. 6-7 years ago    bilateral  (done 2 weeks apart)  . Pr vein bypass graft,aorto-fem-pop      Family History  Problem Relation Age of Onset  . Adrenal disorder Brother   . Hyperlipidemia Brother   . Hypertension Brother   . Heart disease  Brother     Heart Disease before age 30  . Heart attack Brother   . Heart disease Mother     Heart Disease before age 2  . Hyperlipidemia Mother   . Hypertension Mother   . Heart attack Mother   . Heart disease Father     Heart Disease before age 63  . Hyperlipidemia Father   . Heart attack Father   . Pulmonary embolism Sister     Allergies  Allergen Reactions  . Cortisone   . Oxycodone     Severe itching  . Sulfonamide Derivatives     REACTION: rash    Current Outpatient Prescriptions on File Prior to Visit  Medication Sig Dispense Refill  . ALPRAZolam (XANAX) 0.5 MG tablet TAKE 1 TABLET TWICE A DAY AS NEEDED 180 tablet 0  . aspirin 81 MG tablet Take 81 mg by mouth daily.      . benazepril (LOTENSIN) 40 MG tablet TAKE 1 TABLET BY MOUTH EVERY DAY 90 tablet 1  . hydrochlorothiazide (HYDRODIURIL) 25 MG tablet TAKE 1 TABLET BY MOUTH EVERY DAY 90 tablet 1  . Multiple Vitamin (MULTIVITAMIN) tablet Take 1 tablet by mouth daily.      Marland Kitchen  omeprazole (PRILOSEC) 20 MG capsule TAKE 1 CAPSULE (20 MG TOTAL) BY MOUTH DAILY. 90 capsule 1  . pravastatin (PRAVACHOL) 40 MG tablet TAKE 2 TABLETS BY MOUTH EVERY DAY 180 tablet 1  . VITAMIN E PO Take by mouth.       No current facility-administered medications on file prior to visit.    BP 118/70 mmHg  Pulse 91  Temp(Src) 98.4 F (36.9 C) (Oral)  Resp 20  Ht 6' (1.829 m)  Wt 206 lb (93.441 kg)  BMI 27.93 kg/m2  SpO2 98%     Review of Systems  Constitutional: Positive for fever. Negative for chills, appetite change and fatigue.  HENT: Negative for congestion, dental problem, ear pain, hearing loss, sore throat, tinnitus, trouble swallowing and voice change.   Eyes: Negative for pain, discharge and visual disturbance.  Respiratory: Negative for cough, chest tightness, wheezing and stridor.   Cardiovascular: Negative for chest pain, palpitations and leg swelling.  Gastrointestinal: Negative for nausea, vomiting, abdominal pain,  diarrhea, constipation, blood in stool and abdominal distention.  Genitourinary: Positive for dysuria, urgency, difficulty urinating and penile pain. Negative for hematuria, flank pain, discharge and genital sores.  Musculoskeletal: Negative for myalgias, back pain, joint swelling, arthralgias, gait problem and neck stiffness.  Skin: Negative for rash.  Neurological: Negative for dizziness, syncope, speech difficulty, weakness, numbness and headaches.  Hematological: Negative for adenopathy. Does not bruise/bleed easily.  Psychiatric/Behavioral: Negative for behavioral problems and dysphoric mood. The patient is not nervous/anxious.        Objective:   Physical Exam  Constitutional: He is oriented to person, place, and time. He appears well-developed.  HENT:  Head: Normocephalic.  Right Ear: External ear normal.  Left Ear: External ear normal.  Eyes: Conjunctivae and EOM are normal.  Neck: Normal range of motion.  Cardiovascular: Normal rate and normal heart sounds.   Pulmonary/Chest: Breath sounds normal.  Abdominal: Bowel sounds are normal.  Genitourinary:  Prostate plus 3 symmetrically enlarged.  Nontender.  Stool heme-negative  Musculoskeletal: Normal range of motion. He exhibits no edema or tenderness.  Neurological: He is alert and oriented to person, place, and time.  Psychiatric: He has a normal mood and affect. His behavior is normal.          Assessment & Plan:   BPH Dysuria.  Will check a UA.  Urinalysis unremarkable.  Will treat with doxycycline for possible ureteritis Essential hypertension, stable Dyslipidemia.  Continue statin therapy  Add Flomax 0.4 milligrams daily   Nyoka Cowden, MD

## 2015-09-23 ENCOUNTER — Ambulatory Visit: Payer: Self-pay | Admitting: Internal Medicine

## 2015-09-29 ENCOUNTER — Telehealth: Payer: Self-pay | Admitting: Internal Medicine

## 2015-09-29 ENCOUNTER — Telehealth: Payer: Self-pay | Admitting: General Practice

## 2015-09-29 NOTE — Telephone Encounter (Signed)
Patient Name: [name]  DOB: Nov 01, 1955    Initial Comment NEEDS 2ND ATTEMPT AT 2:32P Caller states c/o urinary burning, dribbling and pain despite finishing antibiotics.   Nurse Assessment  Nurse: Thad Ranger RN, Denise Date/Time (Eastern Time): 09/29/2015 3:08:45 PM  Confirm and document reason for call. If symptomatic, describe symptoms. You must click the next button to save text entered. ---Pt states he was seen by the MD last wk for a UTI and placed on Doxycycline 100mg  po bid x 7 days. Finished the med yesterday am and cont to have urinary pain.  Has the patient traveled out of the country within the last 30 days? ---Not Applicable  Does the patient have any new or worsening symptoms? ---Yes  Will a triage be completed? ---Yes  Related visit to physician within the last 2 weeks? ---Yes  Does the PT have any chronic conditions? (i.e. diabetes, asthma, etc.) ---Yes  List chronic conditions. ---AAA  Is this a behavioral health or substance abuse call? ---No     Guidelines    Guideline Title Affirmed Question Affirmed Notes  Urination Pain - Male [1] SEVERE pain with urination (e.g., excruciating) AND [2] not improved after 2 hours of pain medicine (e.g., acetaminophen or ibuprofen)    Final Disposition User   See Physician within 4 Hours (or PCP triage) Carmon, RN, Langley Gauss    Comments  No VMB set up  PT [place] PHONE SINCE HE WAS SEEN LAST WEEK BY THE MD. HE WAS ORDERED TO TAKE DOXYCYCLINE 100MG  PO BID X 7 DAYS, LAST CAP YESTERDAY AND CONTINUES TO HAVE S/S OF UTI. NO FEVER. CALLED [facility] AND SPOKE TO CAROL W/PT REPORT GIVEN. SHE STATES SHE WILL SEND MD A MESSAGE AND HAVE NURSE CALL THE PT BACK THIS AFTERNOON. ADVISED WILL NOT MAKE PT AN APPT AT THIS TIME PENDING RETURN CB FROM MD.  Pt made aware of nurse or MD to cb this eve regarding medication vs recheck in [facility]. Advised to push water, 8-10, 8oz glasses water/day. Verb understanding.   Referrals  REFERRED TO PCP  OFFICE   Disagree/Comply: Comply

## 2015-09-29 NOTE — Telephone Encounter (Signed)
Please advise of different antibiotic or does patient need to be seen.

## 2015-09-29 NOTE — Telephone Encounter (Signed)
LMOVM

## 2015-09-29 NOTE — Telephone Encounter (Signed)
[facility] called stating that the patient still has a UTI and the patient needs to be seen within 4 hours. The patient wants to have his antibiotics changed to something else because the doxycycline (VIBRAMYCIN) 100 MG capsule did not work.  [id number] - [place], [address] [phone] (Phone) [phone] (Fax)

## 2015-09-30 NOTE — Telephone Encounter (Signed)
Attempted to call patient, but left a message on his answering machine. Patient needs follow-up office visit due to dysuria unresponsive to present antibiotic and unclear diagnosis Suggested to patient that he call the office for a office visit today or tomorrow

## 2015-09-30 NOTE — Telephone Encounter (Signed)
Attempted to call patient but not at home. As of 12:53 today patient does not have appointment. Will try again later.

## 2015-09-30 NOTE — Telephone Encounter (Signed)
Please attempt to contact patient and schedule a follow-up office appointment

## 2015-10-02 NOTE — Telephone Encounter (Signed)
Called patient at home but he was not there. Will call again later.

## 2015-10-08 NOTE — Telephone Encounter (Signed)
Left message for patient to call us back and let us know how he is doing.

## 2015-10-08 NOTE — Telephone Encounter (Signed)
Spoke with patient. He stated the UTI is 95% better. Has taken 2 weeks for the antibiotic to work. Offered patient a follow up visit, stated he would come back if doesn't clear up completely

## 2015-10-14 ENCOUNTER — Other Ambulatory Visit: Payer: Self-pay | Admitting: Emergency Medicine

## 2015-10-14 DIAGNOSIS — R300 Dysuria: Secondary | ICD-10-CM

## 2015-10-14 MED ORDER — TAMSULOSIN HCL 0.4 MG PO CAPS: 0.4000 mg | ORAL_CAPSULE | Freq: Every day | ORAL | 1 refills | Status: DC

## 2015-10-19 ENCOUNTER — Encounter: Payer: Self-pay | Admitting: Internal Medicine

## 2015-10-20 ENCOUNTER — Ambulatory Visit: Payer: Self-pay | Admitting: Internal Medicine

## 2015-12-08 ENCOUNTER — Other Ambulatory Visit: Payer: Self-pay | Admitting: Internal Medicine

## 2015-12-17 ENCOUNTER — Other Ambulatory Visit: Payer: Self-pay | Admitting: Internal Medicine

## 2015-12-18 NOTE — Telephone Encounter (Signed)
rx refilled Hydrodiuril 25 mg filled #90 with no refill patient needs f/u for monitor of medication

## 2016-01-14 ENCOUNTER — Other Ambulatory Visit: Payer: Self-pay | Admitting: Internal Medicine

## 2016-01-29 ENCOUNTER — Other Ambulatory Visit: Payer: Self-pay | Admitting: Internal Medicine

## 2016-02-08 ENCOUNTER — Other Ambulatory Visit: Payer: Self-pay | Admitting: Internal Medicine

## 2016-02-08 MED ORDER — HYDROCHLOROTHIAZIDE 25 MG PO TABS: 25.0000 mg | ORAL_TABLET | Freq: Every day | ORAL | 1 refills | Status: DC

## 2016-02-09 ENCOUNTER — Ambulatory Visit (INDEPENDENT_AMBULATORY_CARE_PROVIDER_SITE_OTHER): Payer: Managed Care, Other (non HMO) | Admitting: Family Medicine

## 2016-02-09 ENCOUNTER — Encounter: Payer: Self-pay | Admitting: Family Medicine

## 2016-02-09 VITALS — BP 124/90 | HR 98 | Temp 98.0°F | Resp 12 | Ht 72.0 in | Wt 214.2 lb

## 2016-02-09 DIAGNOSIS — R05 Cough: Secondary | ICD-10-CM | POA: Diagnosis not present

## 2016-02-09 DIAGNOSIS — J069 Acute upper respiratory infection, unspecified: Secondary | ICD-10-CM

## 2016-02-09 DIAGNOSIS — R054 Cough syncope: Secondary | ICD-10-CM

## 2016-02-09 DIAGNOSIS — Z23 Encounter for immunization: Secondary | ICD-10-CM | POA: Diagnosis not present

## 2016-02-09 MED ORDER — BENZONATATE 100 MG PO CAPS: 200.0000 mg | ORAL_CAPSULE | Freq: Two times a day (BID) | ORAL | 0 refills | Status: AC | PRN

## 2016-02-09 NOTE — Patient Instructions (Addendum)
Acute       A few things to remember from today's visit:   URI, acute  Cough syncope - Plan: EKG [id number], Basic Metabolic Panel   viral infections are self-limited and we treat each symptom depending of severity.  Over the counter medications as decongestants and cold medications usually help, they need to be taken with caution if there is a history of high blood pressure or palpitations, so no recommended.   Tylenol also helps with most symptoms (headache, muscle aching, fever,etc) Plenty of fluids. Honey helps with cough. Steam inhalations helps with runny nose, nasal congestion, and may prevent sinus infections. Cough and nasal congestion could last a few days and sometimes weeks. Please follow in not any better in 1-2 weeks or if symptoms get worse.  Symptomatic treatment: Over the counter Acetaminophen 500 mg and/or Ibuprofen (400-600 mg) if there is not contraindications; you can alternate in between both every 4-6 hours. Gargles with saline water and throat lozenges might also help. Cold fluids.    Seek prompt medical evaluation if you are having difficulty breathing, mouth swelling, throat closing up, not able to swallow liquids (drooling), skin rash/bruising, or worsening symptoms.  Please follow up in 1-2 weeks.     Please be sure medication list is accurate. If a new problem present, please set up appointment sooner than planned today.

## 2016-02-09 NOTE — Progress Notes (Signed)
HPI:  ACUTE VISIT:  Chief Complaint  Patient presents with  . URI    [name] is a 60 y.o. male, who is here today with his wife complaining of respiratory symptoms for the past 3 days.   Productive cough with yellowish mucus, no hemoptysis. + Wheezing, he has been on Advair intermittently in the past.  Denies fever, chill, or myalgias. + Mild nasal congestion, rhinorrhea, sore throat, and post nasal drainage.  Denies chest pain, dyspnea, or wheezing.   No Hx of recent travel overseas. Sick contact: grandson. No known insect bite. No Hx of allergies. + Smoker. OTC medications for this problem: Dayquil.  Symptoms otherwise stable.   Sunday he had an episode of syncope after cough spell, EMS was called but he did not feel like he neded, so he called and cancelled it. According to wife, who witness event, he was sitting on bed, lay on his side and was still with open eyes and unresponsive for about 5 seconds. No urine or bowel incontinence, no biting tongue, or sialorrhea. No post ictal like symptoms.  He has had lightheadedness with cough before but no syncope. Denies severe/frequent headache, visual changes, chest pain, dyspnea, palpitation, claudication, focal weakness, or edema. Hx of PAD, he follows with vascular surgeon.   He tells me that he is concerned about his "heart" because right after syncopal episode his HR was elevated,120's. Hx of anxiety, currently he is on Xanax, he states that he is running out, requesting refill. He is not sure when he followed last and has no future f/u appt.     Review of Systems  Constitutional: Positive for fatigue. Negative for activity change, chills and fever.  HENT: Positive for congestion, postnasal drip, rhinorrhea and sore throat. Negative for ear pain, mouth sores, nosebleeds, trouble swallowing and voice change.   Eyes: Negative for discharge, redness, itching and visual disturbance.  Respiratory:  Positive for cough. Negative for shortness of breath and wheezing.   Cardiovascular: Negative for chest pain, palpitations and leg swelling.  Gastrointestinal: Negative for abdominal pain, diarrhea, nausea and vomiting.  Musculoskeletal: Negative for gait problem, myalgias and neck pain.  Skin: Negative for rash.  Neurological: Positive for syncope. Negative for tremors, facial asymmetry, speech difficulty, weakness and headaches.  Hematological: Negative for adenopathy. Does not bruise/bleed easily.  Psychiatric/Behavioral: Negative for confusion. The patient is nervous/anxious.       Current Outpatient Prescriptions on File Prior to Visit  Medication Sig Dispense Refill  . ALPRAZolam (XANAX) 0.5 MG tablet TAKE 1 TABLET BY MOUTH TWICE A DAY AS NEEDED.Marland Kitchen NEED APPOINTMENT FOR FUTHER REFILLS 180 tablet 0  . aspirin 81 MG tablet Take 81 mg by mouth daily.      . benazepril (LOTENSIN) 40 MG tablet TAKE 1 TABLET BY MOUTH EVERY DAY 90 tablet 1  . hydrochlorothiazide (HYDRODIURIL) 25 MG tablet Take 1 tablet (25 mg total) by mouth daily. 90 tablet 1  . Multiple Vitamin (MULTIVITAMIN) tablet Take 1 tablet by mouth daily.      Marland Kitchen omeprazole (PRILOSEC) 20 MG capsule TAKE 1 CAPSULE (20 MG TOTAL) BY MOUTH DAILY. 90 capsule 1  . pravastatin (PRAVACHOL) 40 MG tablet TAKE 2 TABLETS BY MOUTH EVERY DAY 180 tablet 1  . tamsulosin (FLOMAX) 0.4 MG CAPS capsule Take 1 capsule (0.4 mg total) by mouth daily. 90 capsule 1  . VITAMIN E PO Take by mouth.       No current facility-administered medications on file prior to  visit.      Past Medical History:  Diagnosis Date  . AAA (abdominal aortic aneurysm) ([place])   . AAA (abdominal aortic aneurysm) without rupture ([place])   . Arthritis   . Claudication [facility])    lower extremities  . Claustrophobia   . COLONIC POLYPS, HX OF 05/02/2007  . DVT (deep venous thrombosis) ([place])   . GERD 11/08/2006  . HYPERCHOLESTEROLEMIA, SEVERE 05/02/2007  . HYPERTENSION 11/08/2006  .  LOW BACK PAIN 11/22/2007  . NEOPLASM, MALIGNANT, SKIN, 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   Allergies  Allergen Reactions  . Cortisone   . Oxycodone     Severe itching  . Sulfonamide Derivatives     REACTION: rash    Social History   Social History  . Marital status: Married    Spouse name: N/A  . Number of children: N/A  . Years of education: N/A   Social History Main Topics  . Smoking status: Current Every Day Smoker    Packs/day: 0.50    Years: 30.00    Types: Cigarettes  . Smokeless tobacco: Never Used  . Alcohol use No  . Drug use: No  . Sexual activity: Not Asked   Other Topics Concern  . None   Social History Narrative  . None    Vitals:   02/09/16 1604  BP: 124/90  Pulse: 98  Resp: 12  Temp: 98 F (36.7 C)    O2 sat at RA 97%  Body mass index is 29.06 kg/m.    Physical Exam  Nursing note and vitals reviewed. Constitutional: He is oriented to person, place, and time. He appears well-developed. He does not appear ill. No distress.  HENT:  Head: Atraumatic.  Right Ear: Tympanic membrane, external ear and ear canal normal.  Left Ear: Tympanic membrane, external ear and ear canal normal.  Nose: Right sinus exhibits no maxillary sinus tenderness and no frontal sinus tenderness. Left sinus exhibits no maxillary sinus tenderness and no frontal sinus tenderness.  Mouth/Throat: Uvula is midline and mucous membranes are normal. Posterior oropharyngeal erythema present. No oropharyngeal exudate or posterior oropharyngeal edema.  Eyes: Conjunctivae and EOM are normal.  Cardiovascular: Normal rate and regular rhythm.   No murmur heard. Respiratory: Effort normal and breath sounds normal. No stridor. No respiratory distress.  No cough during OV.  Lymphadenopathy:       Head (right side): No submandibular adenopathy present.       Head (left side): No submandibular adenopathy present.    He has no cervical adenopathy.  Neurological: He is alert and oriented to person, place,  and time. He has normal strength.  Skin: Skin is warm. No rash noted. No erythema.  Psychiatric: His speech is normal. His mood appears anxious.  Well groomed, good eye contact.      ASSESSMENT AND PLAN:     [name] was seen today for uri.  Diagnoses and all orders for this visit:    URI, acute  Symptoms suggests a viral etiology, I explained patient that symptomatic treatment is usually recommended in this case, so I do not think abx is needed at this time. Instructed to monitor for signs of complications, including new onset of fever among some, clearly instructed about warning signs. I also explained that cough and nasal congestion can last a few days and sometimes weeks. F/U as needed.   -     benzonatate (TESSALON) 100 MG capsule; Take 2 capsules (200 mg total) by mouth 2 (two) times daily as needed for cough.  Cough syncope  EKG today SR, ? IVCD, normal axis, negative otherwise. No significant changes when compared with prior EKG (09/19/10) We discussed possible causes, including cardiac given his CV risk factors. For now I do not think brain imaging is needed but further studies may be necessary if he has a future event.  Clearly instructed about warning signs. I do not see a recent BMP, so labs done today, further recommendations will be given accordingly. Smoking cessation encouraged.  F/U in 1-2 weeks.  -     EKG 12-Lead -     benzonatate (TESSALON) 100 MG capsule; Take 2 capsules (200 mg total) by mouth 2 (two) times daily as needed for cough. -     Basic Metabolic Panel  Encounter for immunization  -     Flu Vaccine QUAD 36+ mos IM   Requested influenza vaccine. In regard to anxiety recommend following with PCP.      Return in about 2 weeks (around 02/23/2016) for anxiety and cough/syncope with PCP (1-2 weeks).     -[name] was advised to return or notify a doctor immediately if symptoms worsen or new concerns  arise.       Raffaela Ladley G. Martinique, MD  [facility]. [place] office.

## 2016-02-09 NOTE — Progress Notes (Signed)
Pre visit review using our clinic review tool, if applicable. No additional management support is needed unless otherwise documented below in the visit note. 

## 2016-03-01 ENCOUNTER — Other Ambulatory Visit: Payer: Self-pay | Admitting: Internal Medicine

## 2016-03-01 ENCOUNTER — Telehealth: Payer: Self-pay | Admitting: Internal Medicine

## 2016-03-01 MED ORDER — HYDROCHLOROTHIAZIDE 25 MG PO TABS: 25.0000 mg | ORAL_TABLET | Freq: Every day | ORAL | 1 refills | Status: DC

## 2016-03-01 NOTE — Telephone Encounter (Signed)
Hydrochlorothiazide refilled.  Please notify patient to resume taking and that prescription is available at his drugstore

## 2016-03-01 NOTE — Telephone Encounter (Signed)
Spoke to pt, asked him what his blood pressure is? Pt said last evening 178/101 and today at lunch 192/113. Pt is taking OTC Chloroazodin for a cold. Pt denies Headaches. Pt taking Benazepril 40 mg one tablet at HS and has not been taking HCTZ Rx lost and pharmacy will not fill. Pt wants to know if he can take an extra Benazepril. Told pt will get back to him. Pt verbalized understanding.

## 2016-03-01 NOTE — Progress Notes (Signed)
Rx  Refill re-submitted

## 2016-03-01 NOTE — Telephone Encounter (Signed)
° ° ° °  Pt call to say his bp has been running high and is asking if can take a extra bp med to bring it down   benazepril (LOTENSIN) 40 MG tablet   Wouldl ike a call back    [phone]

## 2016-03-01 NOTE — Telephone Encounter (Signed)
Please see message and advise 

## 2016-03-02 NOTE — Telephone Encounter (Signed)
Pt called and informed that his medication Hydrochlorothiazide 25mg  was refilled [facility]. Pt expressed understanding.

## 2016-04-03 ENCOUNTER — Other Ambulatory Visit: Payer: Self-pay | Admitting: Internal Medicine

## 2016-04-05 ENCOUNTER — Encounter: Payer: Self-pay | Admitting: Vascular Surgery

## 2016-04-06 ENCOUNTER — Other Ambulatory Visit: Payer: Self-pay

## 2016-04-06 DIAGNOSIS — I714 Abdominal aortic aneurysm, without rupture, unspecified: Secondary | ICD-10-CM

## 2016-04-12 ENCOUNTER — Ambulatory Visit: Payer: Managed Care, Other (non HMO) | Admitting: Vascular Surgery

## 2016-04-12 ENCOUNTER — Other Ambulatory Visit (HOSPITAL_COMMUNITY): Payer: Managed Care, Other (non HMO)

## 2016-04-14 ENCOUNTER — Encounter: Payer: Self-pay | Admitting: Internal Medicine

## 2016-04-14 ENCOUNTER — Ambulatory Visit (INDEPENDENT_AMBULATORY_CARE_PROVIDER_SITE_OTHER): Payer: Managed Care, Other (non HMO) | Admitting: Internal Medicine

## 2016-04-14 VITALS — BP 120/80 | HR 88 | Temp 97.6°F | Ht 72.0 in | Wt 204.6 lb

## 2016-04-14 DIAGNOSIS — I10 Essential (primary) hypertension: Secondary | ICD-10-CM | POA: Diagnosis not present

## 2016-04-14 MED ORDER — HYDROCODONE-ACETAMINOPHEN 7.5-325 MG PO TABS: 1.0000 | ORAL_TABLET | Freq: Four times a day (QID) | ORAL | 0 refills | Status: DC | PRN

## 2016-04-14 NOTE — Progress Notes (Signed)
Subjective:    Patient ID: [name], male    DOB: Mar 10, 1956, 61 y.o.   MRN: [id number]  HPI  61 year old patient who has essential hypertension.  He presents with a several day history of increasing cough, congestion.  Cough is quite refractory and interferes with sleep.  Cough is mainly productive.  He is had intermittent fever. His wife has a similar illness as does their granddaughter.  Past Medical History:  Diagnosis Date  . AAA (abdominal aortic aneurysm) ([place])   . AAA (abdominal aortic aneurysm) without rupture ([place])   . Arthritis   . 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 Claustrophobia   . COLONIC POLYPS, HX OF 05/02/2007  . DVT (deep venous thrombosis) ([place])   . GERD 11/08/2006  . HYPERCHOLESTEROLEMIA, SEVERE 05/02/2007  . HYPERTENSION 11/08/2006  . LOW BACK PAIN 11/22/2007  . NEOPLASM, MALIGNANT, SKIN, 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     Social History   Social History  . Marital status: Married    Spouse name: N/A  . Number of children: N/A  . Years of education: N/A   Occupational History  . Not on file.   Social History Main Topics  . Smoking status: Current Every Day Smoker    Packs/day: 0.50    Years: 30.00    Types: Cigarettes  . Smokeless tobacco: Never Used  . Alcohol use No  . Drug use: No  . Sexual activity: Not on file   Other Topics Concern  . Not on file   Social History Narrative  . No narrative on file    Past Surgical History:  Procedure Laterality Date  . ANGIOPLASTY  09/20/10   Rt AK to tibioperoneal trunk BPG w/ vein patch angioplasty of tibioperoneal trunk   . carpel tunnel surgery  per pt. 6-7 years ago   bilateral  (done 2 weeks apart)  . FOOT SURGERY     right x2  . MOHS SURGERY     lower lip  . PR VEIN BYPASS GRAFT,AORTO-FEM-POP      Family History  Problem Relation Age of Onset  . Adrenal disorder Brother   . Hyperlipidemia Brother   . Hypertension Brother   . Heart disease Brother     Heart Disease before age 66  .  Heart attack Brother   . Heart disease Mother     Heart Disease before age 66  . Hyperlipidemia Mother   . Hypertension Mother   . Heart attack Mother   . Heart disease Father     Heart Disease before age 56  . Hyperlipidemia Father   . Heart attack Father   . Pulmonary embolism Sister     Allergies  Allergen Reactions  . Cortisone   . Oxycodone     Severe itching  . Sulfonamide Derivatives     REACTION: rash    Current Outpatient Prescriptions on File Prior to Visit  Medication Sig Dispense Refill  . ALPRAZolam (XANAX) 0.5 MG tablet TAKE 1 TABLET BY MOUTH TWICE A DAY AS NEEDED.Marland Kitchen NEED APPOINTMENT FOR FUTHER REFILLS 180 tablet 0  . aspirin 81 MG tablet Take 81 mg by mouth daily.      . benazepril (LOTENSIN) 40 MG tablet TAKE 1 TABLET BY MOUTH EVERY DAY 90 tablet 1  . hydrochlorothiazide (HYDRODIURIL) 25 MG tablet Take 1 tablet (25 mg total) by mouth daily. 90 tablet 1  . Multiple Vitamin (MULTIVITAMIN) tablet Take 1 tablet by mouth daily.      Marland Kitchen omeprazole (  PRILOSEC) 20 MG capsule TAKE 1 CAPSULE (20 MG TOTAL) BY MOUTH DAILY. 90 capsule 1  . pravastatin (PRAVACHOL) 40 MG tablet TAKE 2 TABLETS BY MOUTH EVERY DAY 180 tablet 1  . tamsulosin (FLOMAX) 0.4 MG CAPS capsule Take 1 capsule (0.4 mg total) by mouth daily. 90 capsule 1  . VITAMIN E PO Take by mouth.       No current facility-administered medications on file prior to visit.     BP 120/80 (BP Location: Right Arm, Patient Position: Sitting, Cuff Size: Normal)   Pulse 88   Temp 97.6 F (36.4 C) (Oral)   Ht 6' (1.829 m)   Wt 204 lb 9.6 oz (92.8 kg)   BMI 27.75 kg/m     Review of Systems  Constitutional: Positive for activity change, appetite change, fatigue and fever. Negative for chills.  HENT: Positive for congestion and postnasal drip. Negative for dental problem, ear pain, hearing loss, sore throat, tinnitus, trouble swallowing and voice change.   Eyes: Negative for pain, discharge and visual disturbance.    Respiratory: Positive for cough. Negative for chest tightness, wheezing and stridor.   Cardiovascular: Negative for chest pain, palpitations and leg swelling.  Gastrointestinal: Negative for abdominal distention, abdominal pain, blood in stool, constipation, diarrhea, nausea and vomiting.  Genitourinary: Negative for difficulty urinating, discharge, flank pain, genital sores, hematuria and urgency.  Musculoskeletal: Negative for arthralgias, back pain, gait problem, joint swelling, myalgias and neck stiffness.  Skin: Negative for rash.  Neurological: Negative for dizziness, syncope, speech difficulty, weakness, numbness and headaches.  Hematological: Negative for adenopathy. Does not bruise/bleed easily.  Psychiatric/Behavioral: Negative for behavioral problems and dysphoric mood. The patient is not nervous/anxious.        Objective:   Physical Exam  Constitutional: He is oriented to person, place, and time. He appears well-developed.  No distress but frequent paroxysms of coughing. Afebrile  HENT:  Head: Normocephalic.  Right Ear: External ear normal.  Left Ear: External ear normal.  Eyes: Conjunctivae and EOM are normal.  Neck: Normal range of motion.  Cardiovascular: Normal rate and normal heart sounds.   Pulmonary/Chest: Breath sounds normal. No respiratory distress. He has no wheezes. He has no rales.  Abdominal: Bowel sounds are normal.  Musculoskeletal: Normal range of motion. He exhibits no edema or tenderness.  Neurological: He is alert and oriented to person, place, and time.  Psychiatric: He has a normal mood and affect. His behavior is normal.          Assessment & Plan:   Viral URI with refractory cough.  Negative influenza A screen.  Patient states that he has not been helped by Hydromet in the past.  Will give him a prescription for acetaminophen, hydrocodone 7.5 milligrams by mouth we'll force fluids and treat with an expectorant Will call if any new or  worsening symptoms  Nyoka Cowden

## 2016-04-14 NOTE — Progress Notes (Signed)
Pre visit review using our clinic review tool, if applicable. No additional management support is needed unless otherwise documented below in the visit note. 

## 2016-04-14 NOTE — Patient Instructions (Addendum)
Acute bronchitis symptoms are generally not helped by antibiotics.  Take over-the-counter expectorants and cough medications such as  Mucinex DM.  Call if there is no improvement in 5 to 7 days or if  you develop worsening cough, fever, or new symptoms, such as shortness of breath or chest pain.  Hydrocodone for cough every 6 hours  Hydrate and Humidify  Drink enough water to keep your urine clear or pale yellow. Staying hydrated will help to thin your mucus.  Use a cool mist humidifier to keep the humidity level in your home above 50%.  Inhale steam for 10-15 minutes, 3-4 times a day or as told by your health care provider. You can do this in the bathroom while a hot shower is running.  Limit your exposure to cool or dry air. Rest  Rest as much as possible.

## 2016-06-13 ENCOUNTER — Encounter: Payer: Self-pay | Admitting: Family Medicine

## 2016-06-13 ENCOUNTER — Ambulatory Visit (INDEPENDENT_AMBULATORY_CARE_PROVIDER_SITE_OTHER): Payer: 59 | Admitting: Family Medicine

## 2016-06-13 VITALS — BP 128/72 | HR 113 | Temp 98.9°F | Wt 202.8 lb

## 2016-06-13 DIAGNOSIS — N3090 Cystitis, unspecified without hematuria: Secondary | ICD-10-CM | POA: Diagnosis not present

## 2016-06-13 DIAGNOSIS — R300 Dysuria: Secondary | ICD-10-CM

## 2016-06-13 LAB — POC URINALSYSI DIPSTICK (AUTOMATED)
Bilirubin, UA: NEGATIVE
Glucose, UA: NEGATIVE
Ketones, UA: 5
Nitrite, UA: NEGATIVE
SPEC GRAV UA: 1.015 (ref 1.030–1.035)
UROBILINOGEN UA: 1 (ref ?–2.0)
pH, UA: 6 (ref 5.0–8.0)

## 2016-06-13 MED ORDER — CIPROFLOXACIN HCL 500 MG PO TABS: 500.0000 mg | ORAL_TABLET | Freq: Two times a day (BID) | ORAL | 0 refills | Status: DC

## 2016-06-13 NOTE — Progress Notes (Addendum)
Subjective:    Patient ID: [name], male    DOB: 08-17-1955, 61 y.o.   MRN: [id number]  HPI  [name] is a 61 year old male who presents today with dysuria that started 3 days ago. Associated hematuria is noted and reports fever of 102 at home and that has improved with tylenol.  Temperature in office today without tylenol is 98.9 F. He has a history of slow urine flow that has been chronic.  Denies urethral discharge or frequency. No history of prostatitis previously. He denies N/V/D but reports that he does not feel well and has not been eating but reports drinking water and other liquids. History of similar symptoms 09/2015 where he was treated with an antibiotic and symptoms improved. Treatment at home includes tylenol that has provided limited benefit.  Review of Systems  Constitutional: Positive for appetite change and fever. Negative for chills.  Respiratory: Negative for cough, shortness of breath and wheezing.   Cardiovascular: Negative for chest pain and palpitations.  Gastrointestinal: Positive for nausea. Negative for blood in stool, diarrhea and vomiting.       Suprapubic discomfort  Genitourinary: Positive for dysuria and hematuria. Negative for flank pain, frequency and penile pain.  Skin: Negative for rash.  Neurological: Negative for dizziness, weakness, light-headedness and headaches.    Past Medical History:  Diagnosis Date  . AAA (abdominal aortic aneurysm) ([place])   . AAA (abdominal aortic aneurysm) without rupture ([place])   . Arthritis   . 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 Claustrophobia   . COLONIC POLYPS, HX OF 05/02/2007  . DVT (deep venous thrombosis) ([place])   . GERD 11/08/2006  . HYPERCHOLESTEROLEMIA, SEVERE 05/02/2007  . HYPERTENSION 11/08/2006  . LOW BACK PAIN 11/22/2007  . NEOPLASM, MALIGNANT, SKIN, 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     Social History   Social History  . Marital status: Married    Spouse name: N/A  . Number of children: N/A  . Years of  education: N/A   Occupational History  . Not on file.   Social History Main Topics  . Smoking status: Current Every Day Smoker    Packs/day: 0.50    Years: 30.00    Types: Cigarettes  . Smokeless tobacco: Never Used  . Alcohol use No  . Drug use: No  . Sexual activity: Not on file   Other Topics Concern  . Not on file   Social History Narrative  . No narrative on file    Past Surgical History:  Procedure Laterality Date  . ANGIOPLASTY  09/20/10   Rt AK to tibioperoneal trunk BPG w/ vein patch angioplasty of tibioperoneal trunk   . carpel tunnel surgery  per pt. 6-7 years ago   bilateral  (done 2 weeks apart)  . FOOT SURGERY     right x2  . MOHS SURGERY     lower lip  . PR VEIN BYPASS GRAFT,AORTO-FEM-POP      Family History  Problem Relation Age of Onset  . Adrenal disorder Brother   . Hyperlipidemia Brother   . Hypertension Brother   . Heart disease Brother     Heart Disease before age 46  . Heart attack Brother   . Heart disease Mother     Heart Disease before age 56  . Hyperlipidemia Mother   . Hypertension Mother   . Heart attack Mother   . Heart disease Father     Heart Disease before age 72  . Hyperlipidemia Father   .  Heart attack Father   . Pulmonary embolism Sister     Allergies  Allergen Reactions  . Cortisone   . Oxycodone     Severe itching  . Sulfonamide Derivatives     REACTION: rash    Current Outpatient Prescriptions on File Prior to Visit  Medication Sig Dispense Refill  . ALPRAZolam (XANAX) 0.5 MG tablet TAKE 1 TABLET BY MOUTH TWICE A DAY AS NEEDED.Marland Kitchen NEED APPOINTMENT FOR FUTHER REFILLS 180 tablet 0  . aspirin 81 MG tablet Take 81 mg by mouth daily.      . benazepril (LOTENSIN) 40 MG tablet TAKE 1 TABLET BY MOUTH EVERY DAY 90 tablet 1  . hydrochlorothiazide (HYDRODIURIL) 25 MG tablet Take 1 tablet (25 mg total) by mouth daily. 90 tablet 1  . HYDROcodone-acetaminophen (NORCO) 7.5-325 MG tablet Take 1 tablet by mouth every 6 (six)  hours as needed for moderate pain. 30 tablet 0  . Multiple Vitamin (MULTIVITAMIN) tablet Take 1 tablet by mouth daily.      Marland Kitchen omeprazole (PRILOSEC) 20 MG capsule TAKE 1 CAPSULE (20 MG TOTAL) BY MOUTH DAILY. 90 capsule 1  . pravastatin (PRAVACHOL) 40 MG tablet TAKE 2 TABLETS BY MOUTH EVERY DAY 180 tablet 1  . tamsulosin (FLOMAX) 0.4 MG CAPS capsule Take 1 capsule (0.4 mg total) by mouth daily. 90 capsule 1  . VITAMIN E PO Take by mouth.       No current facility-administered medications on file prior to visit.     BP 128/72 (BP Location: Left Arm, Patient Position: Supine)   Pulse (!) 113   Temp 98.9 F (37.2 C) (Oral)   Wt 202 lb 12.8 oz (92 kg)   SpO2 93%   BMI 27.50 kg/m      Objective:   Physical Exam  Constitutional: He is oriented to person, place, and time. He appears well-developed and well-nourished.  Eyes: Pupils are equal, round, and reactive to light. No scleral icterus.  Neck: Neck supple.  Cardiovascular: Normal rate and regular rhythm.   Pulmonary/Chest: Effort normal and breath sounds normal. He has no wheezes. He has no rales.  Abdominal: Soft. Bowel sounds are normal. There is no tenderness. There is no rebound and no CVA tenderness.  Suprapubic tenderness to palpation  Lymphadenopathy:    He has no cervical adenopathy.  Neurological: He is alert and oriented to person, place, and time. Coordination normal.  Skin: Skin is warm and dry. No rash noted.  Psychiatric: He has a normal mood and affect. His behavior is normal. Judgment and thought content normal.      Assessment & Plan:  1. Cystitis UA indicates + Leukocytes, +Blood; will treat with Cipro for 10 days; suspect symptoms are related to cystitis vs prostatitis but Cipro initiated due to ill presentation today; after consultation with PCP BP meds of benazepril and hydrochlorothiazide will be held for one week with follow up scheduled with PCP at that time. BP is well controlled and is mildly lower than  baseline. We discussed the importance of drinking fluids and monitoring for worsening symptoms and sooner follow up if symptoms do not improve with treatment in 3 days, vomiting, back pain, or abdominal pain are noted. We also discussed that close follow up and communication is important.  HR is mildly elevated and recheck of HR noted as 100.  2. Dysuria  - POCT Urinalysis Dipstick (Automated) - ciprofloxacin (CIPRO) 500 MG tablet; Take 1 tablet (500 mg total) by mouth 2 (two) times daily.  Dispense:  20 tablet; Refill: 0  Patient voiced understanding and agreed with plan.  Follow up appointment made in 3 days.  Delano Metz, FNP-C

## 2016-06-13 NOTE — Patient Instructions (Addendum)
Please take medication as directed and hold your blood pressure medication benazepril and hydrochlorothiazide that we discussed for one week. Follow up in one week for evaluation or sooner if needed. Please remember to seek medical attention if symptoms do not improve with treatment, worsen, or if you resume having fever >101.   Urinary Tract Infection, Adult A urinary tract infection (UTI) is an infection of any part of the urinary tract, which includes the kidneys, ureters, bladder, and urethra. These organs make, store, and get rid of urine in the body. UTI can be a bladder infection (cystitis) or kidney infection (pyelonephritis). What are the causes? This infection may be caused by fungi, viruses, or bacteria. Bacteria are the most common cause of UTIs. This condition can also be caused by repeated incomplete emptying of the bladder during urination. What increases the risk? This condition is more likely to develop if:  You ignore your need to urinate or hold urine for long periods of time.  You do not empty your bladder completely during urination.  You wipe back to front after urinating or having a bowel movement, if you are male.  You are uncircumcised, if you are male.  You are constipated.  You have a urinary catheter that stays in place (indwelling).  You have a weak defense (immune) system.  You have a medical condition that affects your bowels, kidneys, or bladder.  You have diabetes.  You take antibiotic medicines frequently or for long periods of time, and the antibiotics no longer work well against certain types of infections (antibiotic resistance).  You take medicines that irritate your urinary tract.  You are exposed to chemicals that irritate your urinary tract.  You are male. What are the signs or symptoms? Symptoms of this condition include:  Fever.  Frequent urination or passing small amounts of urine frequently.  Needing to urinate  urgently.  Pain or burning with urination.  Urine that smells bad or unusual.  Cloudy urine.  Pain in the lower abdomen or back.  Trouble urinating.  Blood in the urine.  Vomiting or being less hungry than normal.  Diarrhea or abdominal pain.  Vaginal discharge, if you are male. How is this diagnosed? This condition is diagnosed with a medical history and physical exam. You will also need to provide a urine sample to test your urine. Other tests may be done, including:  Blood tests.  Sexually transmitted disease (STD) testing. If you have had more than one UTI, a cystoscopy or imaging studies may be done to determine the cause of the infections. How is this treated? Treatment for this condition often includes a combination of two or more of the following:  Antibiotic medicine.  Other medicines to treat less common causes of UTI.  Over-the-counter medicines to treat pain.  Drinking enough water to stay hydrated. Follow these instructions at home:  Take over-the-counter and prescription medicines only as told by your health care provider.  If you were prescribed an antibiotic, take it as told by your health care provider. Do not stop taking the antibiotic even if you start to feel better.  Avoid alcohol, caffeine, tea, and carbonated beverages. They can irritate your bladder.  Drink enough fluid to keep your urine clear or pale yellow.  Keep all follow-up visits as told by your health care provider. This is important.  Make sure to:  Empty your bladder often and completely. Do not hold urine for long periods of time.  Empty your bladder before and after  sex.  Wipe from front to back after a bowel movement if you are male. Use each tissue one time when you wipe. Contact a health care provider if:  You have back pain.  You have a fever.  You feel nauseous or vomit.  Your symptoms do not get better after 3 days.  Your symptoms go away and then  return. Get help right away if:  You have severe back pain or lower abdominal pain.  You are vomiting and cannot keep down any medicines or water. This information is not intended to replace advice given to you by your health care provider. Make sure you discuss any questions you have with your health care provider. Document Released: 12/08/2004 Document Revised: 08/12/2015 Document Reviewed: 01/19/2015 Elsevier Interactive Patient Education  2017 [employer].

## 2016-06-16 ENCOUNTER — Ambulatory Visit (INDEPENDENT_AMBULATORY_CARE_PROVIDER_SITE_OTHER): Payer: 59 | Admitting: Family Medicine

## 2016-06-16 ENCOUNTER — Encounter: Payer: Self-pay | Admitting: Family Medicine

## 2016-06-16 VITALS — BP 138/88 | HR 82 | Temp 97.6°F | Wt 193.6 lb

## 2016-06-16 DIAGNOSIS — N3090 Cystitis, unspecified without hematuria: Secondary | ICD-10-CM | POA: Diagnosis not present

## 2016-06-16 DIAGNOSIS — R300 Dysuria: Secondary | ICD-10-CM

## 2016-06-16 LAB — POC URINALSYSI DIPSTICK (AUTOMATED)
Bilirubin, UA: NEGATIVE
GLUCOSE UA: NEGATIVE
KETONES UA: NEGATIVE
Leukocytes, UA: NEGATIVE
Nitrite, UA: POSITIVE
PROTEIN UA: 1
SPEC GRAV UA: 1.015 (ref 1.030–1.035)
Urobilinogen, UA: 1 (ref ?–2.0)
pH, UA: 6 (ref 5.0–8.0)

## 2016-06-16 NOTE — Progress Notes (Signed)
Subjective:    Patient ID: [name], male    DOB: 10-Feb-1956, 62 y.o.   MRN: [id number]  HPI  [name] is a 61 year old male who is here for follow up of dysuria and initiation of Cipro on 06/13/16. He reports improvement today and is afebrile and he has not taken any acetaminophen today. Slow urine flow persists however this is chronic in nature and he reports that this is unchanged. He denies urethral discharge and he has no history of prostatitis.  Treatment with Cipro BID with increasing water intake has provided benefit. On 06/13/16, UA +Leukocytes, +Blood and after consultation with PCP and presentation of patient; BP meds of benazepril and HCTZ will be held for one week.  BP was well controlled and noted to be mildly lower than baseline and HR elevated at 100.  Today, he denies hematuria, fever, chills, sweats, and flank pain. Frequency and urgency are present..  BP today is 138/88. He is holding his benazepril and HCTZ until follow up with PCP in one week.     Review of Systems  Constitutional: Negative for chills and fever.  Respiratory: Negative for cough, shortness of breath and wheezing.   Cardiovascular: Negative for chest pain and palpitations.  Gastrointestinal: Negative for abdominal pain, diarrhea, nausea and vomiting.  Genitourinary: Positive for frequency and urgency. Negative for discharge.  Neurological: Negative for dizziness and light-headedness.   Past Medical History:  Diagnosis Date  . AAA (abdominal aortic aneurysm) ([place])   . AAA (abdominal aortic aneurysm) without rupture ([place])   . Arthritis   . 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 Claustrophobia   . COLONIC POLYPS, HX OF 05/02/2007  . DVT (deep venous thrombosis) ([place])   . GERD 11/08/2006  . HYPERCHOLESTEROLEMIA, SEVERE 05/02/2007  . HYPERTENSION 11/08/2006  . LOW BACK PAIN 11/22/2007  . NEOPLASM, MALIGNANT, SKIN, 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     Social History   Social History  . Marital status:  Married    Spouse name: N/A  . Number of children: N/A  . Years of education: N/A   Occupational History  . Not on file.   Social History Main Topics  . Smoking status: Current Every Day Smoker    Packs/day: 0.50    Years: 30.00    Types: Cigarettes  . Smokeless tobacco: Never Used  . Alcohol use No  . Drug use: No  . Sexual activity: Not on file   Other Topics Concern  . Not on file   Social History Narrative  . No narrative on file    Past Surgical History:  Procedure Laterality Date  . ANGIOPLASTY  09/20/10   Rt AK to tibioperoneal trunk BPG w/ vein patch angioplasty of tibioperoneal trunk   . carpel tunnel surgery  per pt. 6-7 years ago   bilateral  (done 2 weeks apart)  . FOOT SURGERY     right x2  . MOHS SURGERY     lower lip  . PR VEIN BYPASS GRAFT,AORTO-FEM-POP      Family History  Problem Relation Age of Onset  . Adrenal disorder Brother   . Hyperlipidemia Brother   . Hypertension Brother   . Heart disease Brother     Heart Disease before age 19  . Heart attack Brother   . Heart disease Mother     Heart Disease before age 34  . Hyperlipidemia Mother   . Hypertension Mother   . Heart attack Mother   . Heart  disease Father     Heart Disease before age 72  . Hyperlipidemia Father   . Heart attack Father   . Pulmonary embolism Sister     Allergies  Allergen Reactions  . Cortisone   . Oxycodone     Severe itching  . Sulfonamide Derivatives     REACTION: rash    Current Outpatient Prescriptions on File Prior to Visit  Medication Sig Dispense Refill  . ALPRAZolam (XANAX) 0.5 MG tablet TAKE 1 TABLET BY MOUTH TWICE A DAY AS NEEDED.Marland Kitchen NEED APPOINTMENT FOR FUTHER REFILLS 180 tablet 0  . aspirin 81 MG tablet Take 81 mg by mouth daily.      . benazepril (LOTENSIN) 40 MG tablet TAKE 1 TABLET BY MOUTH EVERY DAY 90 tablet 1  . ciprofloxacin (CIPRO) 500 MG tablet Take 1 tablet (500 mg total) by mouth 2 (two) times daily. 20 tablet 0  .  hydrochlorothiazide (HYDRODIURIL) 25 MG tablet Take 1 tablet (25 mg total) by mouth daily. 90 tablet 1  . HYDROcodone-acetaminophen (NORCO) 7.5-325 MG tablet Take 1 tablet by mouth every 6 (six) hours as needed for moderate pain. 30 tablet 0  . Multiple Vitamin (MULTIVITAMIN) tablet Take 1 tablet by mouth daily.      Marland Kitchen omeprazole (PRILOSEC) 20 MG capsule TAKE 1 CAPSULE (20 MG TOTAL) BY MOUTH DAILY. 90 capsule 1  . pravastatin (PRAVACHOL) 40 MG tablet TAKE 2 TABLETS BY MOUTH EVERY DAY 180 tablet 1  . tamsulosin (FLOMAX) 0.4 MG CAPS capsule Take 1 capsule (0.4 mg total) by mouth daily. 90 capsule 1  . VITAMIN E PO Take by mouth.       No current facility-administered medications on file prior to visit.     BP 138/88 (BP Location: Left Arm, Patient Position: Sitting, Cuff Size: Normal)   Pulse 82   Temp 97.6 F (36.4 C) (Oral)   Wt 193 lb 9.6 oz (87.8 kg)   SpO2 96%   BMI 26.26 kg/m       Objective:   Physical Exam  Constitutional: He is oriented to person, place, and time. He appears well-developed and well-nourished.  Eyes: Pupils are equal, round, and reactive to light. No scleral icterus.  Neck: Neck supple.  Cardiovascular: Normal rate and regular rhythm.   Pulmonary/Chest: Effort normal and breath sounds normal. He has no wheezes. He has no rales.  Abdominal: Soft. Bowel sounds are normal. There is no tenderness.  Lymphadenopathy:    He has no cervical adenopathy.  Neurological: He is alert and oriented to person, place, and time.  Skin: Skin is warm and dry. No rash noted.       Assessment & Plan:  1. Cystitis Symptoms are improving today; He is tolerating oral medications and has increased his water intake. History of BPH with chronic slow urine flow that remains unchanged. With improving symptoms and follow up scheduled next week with his PCP, deferred prostate exam with discussion of close follow up with PCP per patient preference  2. Dysuria UA indicated + nitrites,  and 2+ blood; Leukocytes are negative today.  Improved with initiation of Cipro. Today, a urine culture was obtained to assess for resistance to Cipro;   Continue Cipro; follow up with PCP in one week as scheduled or sooner if symptoms worsen, changes in urine flow, or fever develops.  Delano Metz, FNP-C

## 2016-06-16 NOTE — Patient Instructions (Addendum)
Please continue antibiotic as prescribed and follow up next Thursday at 2:30pm as discussed or sooner if needed.    WE NOW OFFER   [facility] FAST TRACK!!!  SAME DAY Appointments for ACUTE CARE  Such as: Sprains, Injuries, cuts, abrasions, rashes, muscle pain, joint pain, back pain Colds, flu, sore throats, headache, allergies, cough, fever  Ear pain, sinus and eye infections Abdominal pain, nausea, vomiting, diarrhea, upset stomach Animal/insect bites  3 Easy Ways to Schedule: Walk-In Scheduling Call in scheduling Mychart Sign-up: [email or web address]

## 2016-06-17 LAB — URINE CULTURE: ORGANISM ID, BACTERIA: NO GROWTH

## 2016-06-19 ENCOUNTER — Other Ambulatory Visit: Payer: Self-pay | Admitting: Internal Medicine

## 2016-06-20 ENCOUNTER — Other Ambulatory Visit: Payer: Self-pay | Admitting: Internal Medicine

## 2016-06-20 DIAGNOSIS — R300 Dysuria: Secondary | ICD-10-CM

## 2016-06-23 ENCOUNTER — Ambulatory Visit (INDEPENDENT_AMBULATORY_CARE_PROVIDER_SITE_OTHER): Payer: 59 | Admitting: Internal Medicine

## 2016-06-23 ENCOUNTER — Encounter: Payer: Self-pay | Admitting: Internal Medicine

## 2016-06-23 VITALS — BP 164/86 | HR 90 | Temp 98.0°F | Ht 72.0 in | Wt 204.8 lb

## 2016-06-23 DIAGNOSIS — R300 Dysuria: Secondary | ICD-10-CM | POA: Diagnosis not present

## 2016-06-23 DIAGNOSIS — N400 Enlarged prostate without lower urinary tract symptoms: Secondary | ICD-10-CM | POA: Diagnosis not present

## 2016-06-23 DIAGNOSIS — I10 Essential (primary) hypertension: Secondary | ICD-10-CM

## 2016-06-23 LAB — POC URINALSYSI DIPSTICK (AUTOMATED)
Bilirubin, UA: NEGATIVE
Blood, UA: NEGATIVE
Glucose, UA: NEGATIVE
Ketones, UA: NEGATIVE
LEUKOCYTES UA: NEGATIVE
NITRITE UA: NEGATIVE
Protein, UA: NEGATIVE
SPEC GRAV UA: 1.015 (ref 1.010–1.025)
UROBILINOGEN UA: 0.2 U/dL
pH, UA: 6 (ref 5.0–8.0)

## 2016-06-23 NOTE — Progress Notes (Signed)
Subjective:    Patient ID: [name], male    DOB: 12-17-1955, 61 y.o.   MRN: [id number]  HPI  61 year old patient who is seen today in follow-up. He was seen recently with an acute febrile illness associated with dysuria and hematuria as well as scanty urethral discharge.  He was treated with Cipro and feels much improved.  He still complains of some urinary frequency, dysuria, but in general feels well with resolution of fever.  His appetite is back and he has gained weight. His antihypertensive medications have been on hold  Past Medical History:  Diagnosis Date  . AAA (abdominal aortic aneurysm) ([place])   . AAA (abdominal aortic aneurysm) without rupture ([place])   . Arthritis   . 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 Claustrophobia   . COLONIC POLYPS, HX OF 05/02/2007  . DVT (deep venous thrombosis) ([place])   . GERD 11/08/2006  . HYPERCHOLESTEROLEMIA, SEVERE 05/02/2007  . HYPERTENSION 11/08/2006  . LOW BACK PAIN 11/22/2007  . NEOPLASM, MALIGNANT, SKIN, 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     Social History   Social History  . Marital status: Married    Spouse name: N/A  . Number of children: N/A  . Years of education: N/A   Occupational History  . Not on file.   Social History Main Topics  . Smoking status: Current Every Day Smoker    Packs/day: 0.50    Years: 30.00    Types: Cigarettes  . Smokeless tobacco: Never Used  . Alcohol use No  . Drug use: No  . Sexual activity: Not on file   Other Topics Concern  . Not on file   Social History Narrative  . No narrative on file    Past Surgical History:  Procedure Laterality Date  . ANGIOPLASTY  09/20/10   Rt AK to tibioperoneal trunk BPG w/ vein patch angioplasty of tibioperoneal trunk   . carpel tunnel surgery  per pt. 6-7 years ago   bilateral  (done 2 weeks apart)  . FOOT SURGERY     right x2  . MOHS SURGERY     lower lip  . PR VEIN BYPASS GRAFT,AORTO-FEM-POP      Family History  Problem Relation Age of Onset  .  Adrenal disorder Brother   . Hyperlipidemia Brother   . Hypertension Brother   . Heart disease Brother     Heart Disease before age 16  . Heart attack Brother   . Heart disease Mother     Heart Disease before age 68  . Hyperlipidemia Mother   . Hypertension Mother   . Heart attack Mother   . Heart disease Father     Heart Disease before age 31  . Hyperlipidemia Father   . Heart attack Father   . Pulmonary embolism Sister     Allergies  Allergen Reactions  . Cortisone   . Oxycodone     Severe itching  . Sulfonamide Derivatives     REACTION: rash    Current Outpatient Prescriptions on File Prior to Visit  Medication Sig Dispense Refill  . ALPRAZolam (XANAX) 0.5 MG tablet TAKE 1 TABLET TWICE A DAY AS NEEDED 180 tablet 0  . aspirin 81 MG tablet Take 81 mg by mouth daily.      . benazepril (LOTENSIN) 40 MG tablet TAKE 1 TABLET BY MOUTH EVERY DAY 90 tablet 1  . ciprofloxacin (CIPRO) 500 MG tablet Take 1 tablet (500 mg total) by mouth 2 (two) times daily. [place]  tablet 0  . hydrochlorothiazide (HYDRODIURIL) 25 MG tablet Take 1 tablet (25 mg total) by mouth daily. 90 tablet 1  . HYDROcodone-acetaminophen (NORCO) 7.5-325 MG tablet Take 1 tablet by mouth every 6 (six) hours as needed for moderate pain. 30 tablet 0  . Multiple Vitamin (MULTIVITAMIN) tablet Take 1 tablet by mouth daily.      Marland Kitchen omeprazole (PRILOSEC) 20 MG capsule TAKE 1 CAPSULE (20 MG TOTAL) BY MOUTH DAILY. 90 capsule 1  . pravastatin (PRAVACHOL) 40 MG tablet TAKE 2 TABLETS BY MOUTH EVERY DAY 180 tablet 1  . tamsulosin (FLOMAX) 0.4 MG CAPS capsule TAKE ONE CAPSULE BY MOUTH EVERY DAY 90 capsule 1  . VITAMIN E PO Take by mouth.       No current facility-administered medications on file prior to visit.     BP (!) 164/86 (BP Location: Left Arm, Patient Position: Sitting, Cuff Size: Normal)   Pulse 90   Temp 98 F (36.7 C) (Oral)   Ht 6' (1.829 m)   Wt 204 lb 12.8 oz (92.9 kg)   SpO2 98%   BMI 27.78 kg/m     Review  of Systems  Constitutional: Positive for activity change, appetite change, fatigue and fever. Negative for chills.  HENT: Negative for congestion, dental problem, ear pain, hearing loss, sore throat, tinnitus, trouble swallowing and voice change.   Eyes: Negative for pain, discharge and visual disturbance.  Respiratory: Negative for cough, chest tightness, wheezing and stridor.   Cardiovascular: Negative for chest pain, palpitations and leg swelling.  Gastrointestinal: Positive for abdominal pain. Negative for abdominal distention, blood in stool, constipation, diarrhea, nausea and vomiting.  Genitourinary: Positive for difficulty urinating, discharge, dysuria, frequency and hematuria. Negative for flank pain, genital sores and urgency.  Musculoskeletal: Negative for arthralgias, back pain, gait problem, joint swelling, myalgias and neck stiffness.  Skin: Negative for rash.  Neurological: Negative for dizziness, syncope, speech difficulty, weakness, numbness and headaches.  Hematological: Negative for adenopathy. Does not bruise/bleed easily.  Psychiatric/Behavioral: Negative for behavioral problems and dysphoric mood. The patient is not nervous/anxious.        Objective:   Physical Exam  Constitutional: He is oriented to person, place, and time. He appears well-developed and well-nourished. No distress.  HENT:  Head: Normocephalic.  Right Ear: External ear normal.  Left Ear: External ear normal.  Eyes: Conjunctivae and EOM are normal.  Neck: Normal range of motion.  Cardiovascular: Normal rate and normal heart sounds.   Pulmonary/Chest: Breath sounds normal.  Abdominal: Bowel sounds are normal.  Suprapubic tenderness has resolved  Musculoskeletal: Normal range of motion. He exhibits no edema or tenderness.  Neurological: He is alert and oriented to person, place, and time.  Psychiatric: He has a normal mood and affect. His behavior is normal.          Assessment & Plan:    Acute febrile illness associated with dysuria.  Urinalysis is normal.  Patient has completed 10 days of Cipro.  Will observe at this point.  Persistent urinary frequency  And dysuria bothersome.  Essential hypertension.  Will resume benazepril, but continue to hold diuretic therapy  History of BPH.  We'll continue Flomax  [employer]

## 2016-06-23 NOTE — Progress Notes (Signed)
Pre visit review using our clinic review tool, if applicable. No additional management support is needed unless otherwise documented below in the visit note. 

## 2016-06-23 NOTE — Patient Instructions (Addendum)
Limit your sodium (Salt) intake  Resume benazepril, but continue to hold hydrochlorothiazide  Please check your blood pressure on a regular basis.  If it is consistently greater than 150/90, please make an office appointment.  Call or return to clinic prn if these symptoms worsen or fail to improve as anticipated.

## 2016-07-22 ENCOUNTER — Other Ambulatory Visit: Payer: Self-pay | Admitting: Internal Medicine

## 2016-08-16 ENCOUNTER — Encounter: Payer: Self-pay | Admitting: Vascular Surgery

## 2016-08-18 ENCOUNTER — Other Ambulatory Visit: Payer: Self-pay | Admitting: Internal Medicine

## 2016-08-27 ENCOUNTER — Encounter: Payer: Self-pay | Admitting: Internal Medicine

## 2016-08-30 ENCOUNTER — Ambulatory Visit (INDEPENDENT_AMBULATORY_CARE_PROVIDER_SITE_OTHER): Payer: 59 | Admitting: Internal Medicine

## 2016-08-30 ENCOUNTER — Encounter: Payer: Self-pay | Admitting: Internal Medicine

## 2016-08-30 VITALS — BP 156/82 | HR 84 | Temp 98.2°F | Ht 72.0 in | Wt 205.4 lb

## 2016-08-30 DIAGNOSIS — R51 Headache: Secondary | ICD-10-CM | POA: Diagnosis not present

## 2016-08-30 DIAGNOSIS — R519 Headache, unspecified: Secondary | ICD-10-CM

## 2016-08-30 DIAGNOSIS — S060X0A Concussion without loss of consciousness, initial encounter: Secondary | ICD-10-CM

## 2016-08-30 DIAGNOSIS — I10 Essential (primary) hypertension: Secondary | ICD-10-CM

## 2016-08-30 MED ORDER — TRAMADOL HCL 50 MG PO TABS: 50.0000 mg | ORAL_TABLET | Freq: Four times a day (QID) | ORAL | 0 refills | Status: DC | PRN

## 2016-08-30 NOTE — Progress Notes (Signed)
Subjective:    Patient ID: [name], male    DOB: November 07, 1955, 61 y.o.   MRN: [id number]  HPI  61 year old patient who sustained head trauma 10 days ago.  He presents with a chief complaint of headaches as well as neck pain following the trauma. He states that he was sitting in a 70 pound billboard fell striking his left parietal skull area.  There was no immediate loss of consciousness. In addition to headaches, patient has had dizziness which has improved.  Headaches have persisted and he also describes some short-term memory impairment. He has noticed some soft tissue swelling in the right posterior neck area, neck pain persists and aggravated by range of motion  Past Medical History:  Diagnosis Date  . AAA (abdominal aortic aneurysm) ([place])   . AAA (abdominal aortic aneurysm) without rupture ([place])   . Arthritis   . 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 Claustrophobia   . COLONIC POLYPS, HX OF 05/02/2007  . DVT (deep venous thrombosis) ([place])   . GERD 11/08/2006  . HYPERCHOLESTEROLEMIA, SEVERE 05/02/2007  . HYPERTENSION 11/08/2006  . LOW BACK PAIN 11/22/2007  . NEOPLASM, MALIGNANT, SKIN, 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     Social History   Social History  . Marital status: Married    Spouse name: N/A  . Number of children: N/A  . Years of education: N/A   Occupational History  . Not on file.   Social History Main Topics  . Smoking status: Current Every Day Smoker    Packs/day: 0.50    Years: 30.00    Types: Cigarettes  . Smokeless tobacco: Never Used  . Alcohol use No  . Drug use: No  . Sexual activity: Not on file   Other Topics Concern  . Not on file   Social History Narrative  . No narrative on file    Past Surgical History:  Procedure Laterality Date  . ANGIOPLASTY  09/20/10   Rt AK to tibioperoneal trunk BPG w/ vein patch angioplasty of tibioperoneal trunk   . carpel tunnel surgery  per pt. 6-7 years ago   bilateral  (done 2 weeks apart)  . FOOT SURGERY     right x2  . MOHS SURGERY     lower lip  . PR VEIN BYPASS GRAFT,AORTO-FEM-POP      Family History  Problem Relation Age of Onset  . Adrenal disorder Brother   . Hyperlipidemia Brother   . Hypertension Brother   . Heart disease Brother        Heart Disease before age 47  . Heart attack Brother   . Heart disease Mother        Heart Disease before age 74  . Hyperlipidemia Mother   . Hypertension Mother   . Heart attack Mother   . Heart disease Father        Heart Disease before age 66  . Hyperlipidemia Father   . Heart attack Father   . Pulmonary embolism Sister     Allergies  Allergen Reactions  . Cortisone   . Oxycodone     Severe itching  . Sulfonamide Derivatives     REACTION: rash    Current Outpatient Prescriptions on File Prior to Visit  Medication Sig Dispense Refill  . ALPRAZolam (XANAX) 0.5 MG tablet TAKE 1 TABLET TWICE A DAY AS NEEDED 180 tablet 0  . aspirin 81 MG tablet Take 81 mg by mouth daily.      . benazepril (LOTENSIN) 40  MG tablet TAKE 1 TABLET BY MOUTH EVERY DAY 90 tablet 1  . ciprofloxacin (CIPRO) 500 MG tablet Take 1 tablet (500 mg total) by mouth 2 (two) times daily. 20 tablet 0  . hydrochlorothiazide (HYDRODIURIL) 25 MG tablet Take 1 tablet (25 mg total) by mouth daily. 90 tablet 1  . HYDROcodone-acetaminophen (NORCO) 7.5-325 MG tablet Take 1 tablet by mouth every 6 (six) hours as needed for moderate pain. 30 tablet 0  . Multiple Vitamin (MULTIVITAMIN) tablet Take 1 tablet by mouth daily.      Marland Kitchen omeprazole (PRILOSEC) 20 MG capsule TAKE 1 CAPSULE (20 MG TOTAL) BY MOUTH DAILY. 90 capsule 1  . pravastatin (PRAVACHOL) 40 MG tablet TAKE 2 TABLETS BY MOUTH EVERY DAY 180 tablet 1  . tamsulosin (FLOMAX) 0.4 MG CAPS capsule TAKE ONE CAPSULE BY MOUTH EVERY DAY 90 capsule 1  . VITAMIN E PO Take by mouth.       No current facility-administered medications on file prior to visit.     BP (!) 156/82 (BP Location: Left Arm, Patient Position: Sitting, Cuff Size:  Normal)   Pulse 84   Temp 98.2 F (36.8 C) (Oral)   Ht 6' (1.829 m)   Wt 205 lb 6.4 oz (93.2 kg)   SpO2 98%   BMI 27.86 kg/m     Review of Systems  Constitutional: Positive for activity change. Negative for appetite change, chills, fatigue and fever.  HENT: Negative for congestion, dental problem, ear pain, hearing loss, sore throat, tinnitus, trouble swallowing and voice change.   Eyes: Negative for pain, discharge and visual disturbance.  Respiratory: Negative for cough, chest tightness, wheezing and stridor.   Cardiovascular: Negative for chest pain, palpitations and leg swelling.  Gastrointestinal: Negative for abdominal distention, abdominal pain, blood in stool, constipation, diarrhea, nausea and vomiting.  Genitourinary: Negative for difficulty urinating, discharge, flank pain, genital sores, hematuria and urgency.  Musculoskeletal: Positive for neck pain and neck stiffness. Negative for arthralgias, back pain, gait problem, joint swelling and myalgias.  Skin: Negative for rash.  Neurological: Positive for dizziness and headaches. Negative for syncope, speech difficulty, weakness and numbness.  Hematological: Negative for adenopathy. Does not bruise/bleed easily.  Psychiatric/Behavioral: Positive for decreased concentration. Negative for behavioral problems and dysphoric mood. The patient is not nervous/anxious.        Objective:   Physical Exam  Constitutional: He is oriented to person, place, and time. He appears well-developed.  HENT:  Head: Normocephalic.  Right Ear: External ear normal.  Left Ear: External ear normal.  Eyes: Conjunctivae and EOM are normal.  Neck:  Soft tissue swelling involving the right posterior neck area Neck pain with range of motion as best with head turning to the left and extension of the head  Cardiovascular: Normal rate and normal heart sounds.   Pulmonary/Chest: Breath sounds normal.  Abdominal: Bowel sounds are normal.    Musculoskeletal: Normal range of motion. He exhibits no edema or tenderness.  Neurological: He is alert and oriented to person, place, and time. He has normal reflexes. No cranial nerve deficit.  Tandem walk performed poorly  Psychiatric: He has a normal mood and affect. His behavior is normal.          Assessment & Plan:   Posttraumatic concussion.   Neck pain.  Probable musculoligamentous  In view of persistent symptoms after 10 days.  Will set up head CT without contrast to rule out significant pathology  Patient has hydrocodone for pain, which he feels he does  not tolerate well.  Will treat with tramadol Essential hypertension History of abdominal aortic aneurysm  Nyoka Cowden

## 2016-08-30 NOTE — Patient Instructions (Addendum)
WE NOW OFFER   [facility] FAST TRACK!!!  SAME DAY Appointments for ACUTE CARE  Such as: Sprains, Injuries, cuts, abrasions, rashes, muscle pain, joint pain, back pain Colds, flu, sore throats, headache, allergies, cough, fever  Ear pain, sinus and eye infections Abdominal pain, nausea, vomiting, diarrhea, upset stomach Animal/insect bites  3 Easy Ways to Schedule: Walk-In Scheduling Call in scheduling Mychart Sign-up: [email or web address]   Head CT tomorrow as discussed      Post-Concussion Syndrome Post-concussion syndrome describes the symptoms that can occur after a head injury. These symptoms can last from weeks to months. What are the causes? It is not clear why some head injuries cause post-concussion syndrome. It can occur whether your head injury was mild or severe and whether you were wearing head protection or not. What are the signs or symptoms?  Memory difficulties.  Dizziness.  Headaches.  Double vision or blurry vision.  Sensitivity to light.  Hearing difficulties.  Depression.  Tiredness.  Weakness.  Difficulty with concentration.  Difficulty sleeping or staying asleep.  Vomiting.  Poor balance or instability on your feet.  Slow reaction time.  Difficulty learning and remembering things you have heard. How is this diagnosed? There is no test to determine whether you have post-concussion syndrome. Your health care provider may order an imaging scan of your brain, such as a CT scan, to check for other problems that may be causing your symptoms (such as a severe injury inside your skull). How is this treated? Usually, these problems disappear over time without medical care. Your health care provider may prescribe medicine to help ease your symptoms. It is important to follow up with a neurologist to evaluate your recovery and address any lingering symptoms or issues. Follow these instructions at home:  Take medicines  only as directed by your health care provider. Do not take aspirin. Aspirin can slow blood clotting.  Sleep with your head slightly elevated to help with headaches.  Avoid any situation where there is potential for another head injury. This includes football, hockey, soccer, basketball, martial arts, downhill snow sports, and horseback riding. Your condition will get worse every time you experience a concussion. You should avoid these activities until you are evaluated by the appropriate follow-up health care providers.  Keep all follow-up visits as directed by your health care provider. This is important. Contact a health care provider if:  You have increased problems paying attention or concentrating.  You have increased difficulty remembering or learning new information.  You need more time to complete tasks or assignments than before.  You have increased irritability or decreased ability to cope with stress.  You have more symptoms than before. Seek medical care if you have any of the following symptoms for more than two weeks after your injury:  Lasting (chronic) headaches.  Dizziness or balance problems.  Nausea.  Vision problems.  Increased sensitivity to noise or light.  Depression or mood swings.  Anxiety or irritability.  Memory problems.  Difficulty concentrating or paying attention.  Sleep problems.  Feeling tired all the time.  Get help right away if:  You have confusion or unusual drowsiness.  Others find it difficult to wake you up.  You have nausea or persistent, forceful vomiting.  You feel like you are moving when you are not (vertigo). Your eyes may move rapidly back and forth.  You have convulsions or faint.  You have severe, persistent headaches that are not relieved by medicine.  You cannot use your arms  or legs normally.  One of your pupils is larger than the other.  You have clear or bloody discharge from your nose or ears.  Your  problems are getting worse, not better. This information is not intended to replace advice given to you by your health care provider. Make sure you discuss any questions you have with your health care provider. Document Released: 08/20/2001 Document Revised: 09/18/2015 Document Reviewed: 06/05/2013 Elsevier Interactive Patient Education  2018 [employer].

## 2016-08-31 ENCOUNTER — Ambulatory Visit (INDEPENDENT_AMBULATORY_CARE_PROVIDER_SITE_OTHER)
Admission: RE | Admit: 2016-08-31 | Discharge: 2016-08-31 | Disposition: A | Discharge status: Home / Self Care | Payer: 59 | Source: Ambulatory Visit | Attending: Internal Medicine | Admitting: Internal Medicine

## 2016-08-31 DIAGNOSIS — R51 Headache: Secondary | ICD-10-CM

## 2016-08-31 DIAGNOSIS — S060X0A Concussion without loss of consciousness, initial encounter: Secondary | ICD-10-CM

## 2016-10-21 ENCOUNTER — Ambulatory Visit: Payer: Self-pay | Admitting: Internal Medicine

## 2016-10-21 MED ORDER — TRAMADOL HCL 50 MG PO TABS: 50.0000 mg | ORAL_TABLET | Freq: Four times a day (QID) | ORAL | 0 refills | Status: DC | PRN

## 2016-10-27 ENCOUNTER — Other Ambulatory Visit (HOSPITAL_COMMUNITY): Payer: 59

## 2016-10-27 ENCOUNTER — Encounter (HOSPITAL_COMMUNITY): Payer: 59

## 2016-11-01 ENCOUNTER — Ambulatory Visit: Payer: 59 | Admitting: Vascular Surgery

## 2016-11-05 ENCOUNTER — Other Ambulatory Visit: Payer: Self-pay | Admitting: Internal Medicine

## 2016-11-16 ENCOUNTER — Other Ambulatory Visit: Payer: Self-pay | Admitting: Internal Medicine

## 2016-11-16 MED ORDER — TRAMADOL HCL 50 MG PO TABS: 50.0000 mg | ORAL_TABLET | Freq: Four times a day (QID) | ORAL | 0 refills | Status: DC | PRN

## 2016-11-22 ENCOUNTER — Other Ambulatory Visit: Payer: Self-pay

## 2016-11-22 DIAGNOSIS — I739 Peripheral vascular disease, unspecified: Secondary | ICD-10-CM

## 2016-11-29 ENCOUNTER — Ambulatory Visit (INDEPENDENT_AMBULATORY_CARE_PROVIDER_SITE_OTHER): Payer: 59 | Admitting: Vascular Surgery

## 2016-11-29 ENCOUNTER — Encounter: Payer: Self-pay | Admitting: Vascular Surgery

## 2016-11-29 ENCOUNTER — Ambulatory Visit (INDEPENDENT_AMBULATORY_CARE_PROVIDER_SITE_OTHER)
Admission: RE | Admit: 2016-11-29 | Discharge: 2016-11-29 | Disposition: A | Discharge status: Home / Self Care | Payer: 59 | Source: Ambulatory Visit | Attending: Vascular Surgery | Admitting: Vascular Surgery

## 2016-11-29 ENCOUNTER — Ambulatory Visit (HOSPITAL_COMMUNITY)
Admission: RE | Admit: 2016-11-29 | Discharge: 2016-11-29 | Disposition: A | Discharge status: Home / Self Care | Payer: 59 | Source: Ambulatory Visit | Attending: Vascular Surgery | Admitting: Vascular Surgery

## 2016-11-29 VITALS — BP 145/89 | HR 72 | Temp 97.1°F | Resp 20 | Ht 72.0 in | Wt 215.0 lb

## 2016-11-29 DIAGNOSIS — I723 Aneurysm of iliac artery: Secondary | ICD-10-CM | POA: Diagnosis not present

## 2016-11-29 DIAGNOSIS — I739 Peripheral vascular disease, unspecified: Secondary | ICD-10-CM

## 2016-11-29 DIAGNOSIS — I714 Abdominal aortic aneurysm, without rupture, unspecified: Secondary | ICD-10-CM

## 2016-11-29 NOTE — Progress Notes (Signed)
[facility]  Patient name: [name] MRN: [id number] DOB: Jul 25, 1955 Sex: male  REASON FOR VISIT: All up of known right peripheral vascular occlusive disease status post above-knee to below-knee popliteal bypass on the right in 2012  HPI: [name] is a 61 y.o. male here today for follow-up. Has known abdominal aortic aneurysm and bilateral common iliac artery aneurysms. No symptoms referable to this. He does have hip symptoms which could potentially be claudication related to his iliac vessels. His maximal diameter is been  5 cm with the iliac vessels in the 2-1/2 cm range. He had a recent concussion at work when he was struck in the head by falling object. Is recovering from this. Does not have any right leg claudication symptoms  Past Medical History:  Diagnosis Date  . AAA (abdominal aortic aneurysm) ([place])   . AAA (abdominal aortic aneurysm) without rupture ([place])   . Arthritis   . 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 Claustrophobia   . COLONIC POLYPS, HX OF 05/02/2007  . DVT (deep venous thrombosis) ([place])   . GERD 11/08/2006  . HYPERCHOLESTEROLEMIA, SEVERE 05/02/2007  . HYPERTENSION 11/08/2006  . LOW BACK PAIN 11/22/2007  . NEOPLASM, MALIGNANT, SKIN, 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    Family History  Problem Relation Age of Onset  . Adrenal disorder Brother   . Hyperlipidemia Brother   . Hypertension Brother   . Heart disease Brother        Heart Disease before age 5  . Heart attack Brother   . Heart disease Mother        Heart Disease before age 78  . Hyperlipidemia Mother   . Hypertension Mother   . Heart attack Mother   . Heart disease Father        Heart Disease before age 83  . Hyperlipidemia Father   . Heart attack Father   . Pulmonary embolism Sister     SOCIAL HISTORY: Social History  Substance Use Topics  . Smoking status: Current Every Day Smoker    Packs/day: 0.50    Years: 30.00   Types: Cigarettes  . Smokeless tobacco: Never Used  . Alcohol use No    Allergies  Allergen Reactions  . Cortisone   . Oxycodone     Severe itching  . Sulfonamide Derivatives     REACTION: rash    Current Outpatient Prescriptions  Medication Sig Dispense Refill  . ALPRAZolam (XANAX) 0.5 MG tablet TAKE 1 TABLET TWICE A DAY AS NEEDED 180 tablet 0  . aspirin 81 MG tablet Take 81 mg by mouth daily.      . benazepril (LOTENSIN) 40 MG tablet TAKE 1 TABLET BY MOUTH EVERY DAY 90 tablet 1  . ciprofloxacin (CIPRO) 500 MG tablet Take 1 tablet (500 mg total) by mouth 2 (two) times daily. 20 tablet 0  . hydrochlorothiazide (HYDRODIURIL) 25 MG tablet Take 1 tablet (25 mg total) by mouth daily. 90 tablet 1  . Multiple Vitamin (MULTIVITAMIN) tablet Take 1 tablet by mouth daily.      Marland Kitchen omeprazole (PRILOSEC) 20 MG capsule TAKE 1 CAPSULE (20 MG TOTAL) BY MOUTH DAILY. 90 capsule 1  . pravastatin (PRAVACHOL) 40 MG tablet TAKE 2 TABLETS BY MOUTH EVERY DAY 180 tablet 1  . tamsulosin (FLOMAX) 0.4 MG CAPS capsule TAKE ONE CAPSULE BY MOUTH EVERY DAY 90 capsule 1  . traMADol (ULTRAM) 50 MG tablet Take 1 tablet (50 mg total) by mouth every 6 (  six) hours as needed. 60 tablet 0  . VITAMIN E PO Take by mouth.       No current facility-administered medications for this visit.     REVIEW OF SYSTEMS:  [X]  denotes positive finding, [ ]  denotes negative finding Cardiac  Comments:  Chest pain or chest pressure:    Shortness of breath upon exertion:    Short of breath when lying flat:    Irregular heart rhythm:        Vascular    Pain in calf, thigh, or hip brought on by ambulation:    Pain in feet at night that wakes you up from your sleep:     Blood clot in your veins:    Leg swelling:           PHYSICAL EXAM: Vitals:   11/29/16 0950  BP: (!) 145/89  Pulse: 72  Resp: 20  Temp: (!) 97.1 F (36.2 C)  SpO2: 96%  Weight: 215 lb (97.5 kg)  Height: 6' (1.829 m)    GENERAL: The patient is a  well-nourished male, in no acute distress. The vital signs are documented above. CARDIOVASCULAR: 2+ radial pulses bilaterally. Palpable popliteal graft pulse on the right and palpable dorsalis pedis pulses bilaterally Abdomen moderately obese with no aneurysm palpable PULMONARY: There is good air exchange  MUSCULOSKELETAL: There are no major deformities or cyanosis. NEUROLOGIC: No focal weakness or paresthesias are detected. SKIN: There are no ulcers or rashes noted. PSYCHIATRIC: The patient has a normal affect.  DATA:  Noninvasive studies on the right leg reveal normal ankle arm index bilaterally with biphasic and triphasic flow throughout his graft. He has had some elevated velocities but no critical stenosis in his right leg bypass.  Aortic ultrasound reveals no change with maximal diameter 5 cm aorta and moderate dilatation of 2.6 common iliac arteries bilaterally  MEDICAL ISSUES:  Stable overall. We will continue his walking program. We'll see him again in 6 months which time we will obtain a CT of his abdomen and pelvis for follow-up. At his age of 47 would recommend repair of his aneurysm for any enlargement. I would also evaluate any potential iliac occlusive disease. He would notify should he develop any difficulty in the interim    [name], MD FACS [facility] Tel [phone] Pager [phone]

## 2016-12-01 ENCOUNTER — Encounter: Payer: Self-pay | Admitting: Internal Medicine

## 2016-12-08 NOTE — Addendum Note (Signed)
Addended by: Lianne Cure A on: 12/08/2016 03:48 PM   Modules accepted: Orders

## 2016-12-11 ENCOUNTER — Other Ambulatory Visit: Payer: Self-pay | Admitting: Internal Medicine

## 2016-12-11 DIAGNOSIS — R300 Dysuria: Secondary | ICD-10-CM

## 2017-01-15 ENCOUNTER — Other Ambulatory Visit: Payer: Self-pay | Admitting: Internal Medicine

## 2017-02-27 ENCOUNTER — Other Ambulatory Visit: Payer: Self-pay | Admitting: Internal Medicine

## 2017-04-16 ENCOUNTER — Other Ambulatory Visit: Payer: Self-pay | Admitting: Internal Medicine

## 2017-05-07 ENCOUNTER — Other Ambulatory Visit: Payer: Self-pay | Admitting: Internal Medicine

## 2017-05-24 ENCOUNTER — Other Ambulatory Visit: Payer: Self-pay | Admitting: Internal Medicine

## 2017-05-29 NOTE — Telephone Encounter (Signed)
Left message for patient to return phone call. Need to schedule an office visit.

## 2017-05-30 ENCOUNTER — Ambulatory Visit
Admission: RE | Admit: 2017-05-30 | Discharge: 2017-05-30 | Disposition: A | Discharge status: Home / Self Care | Payer: 59 | Source: Ambulatory Visit | Attending: Vascular Surgery | Admitting: Vascular Surgery

## 2017-05-30 ENCOUNTER — Other Ambulatory Visit: Payer: 59

## 2017-05-30 ENCOUNTER — Ambulatory Visit: Payer: 59 | Admitting: Vascular Surgery

## 2017-05-30 ENCOUNTER — Encounter: Payer: Self-pay | Admitting: *Deleted

## 2017-05-30 ENCOUNTER — Encounter: Payer: Self-pay | Admitting: Vascular Surgery

## 2017-05-30 ENCOUNTER — Other Ambulatory Visit: Payer: Self-pay

## 2017-05-30 VITALS — BP 146/95 | HR 83 | Resp 20 | Ht 72.0 in | Wt 214.0 lb

## 2017-05-30 DIAGNOSIS — I714 Abdominal aortic aneurysm, without rupture, unspecified: Secondary | ICD-10-CM

## 2017-05-30 DIAGNOSIS — I739 Peripheral vascular disease, unspecified: Secondary | ICD-10-CM

## 2017-05-30 MED ORDER — IOPAMIDOL (ISOVUE-370) INJECTION 76%
75.0000 mL | Freq: Once | INTRAVENOUS | Status: AC | PRN
  Administered 2017-05-30: 75 mL via INTRAVENOUS

## 2017-05-30 NOTE — Progress Notes (Signed)
Patient stated that he " needed at least a month before having surgery".Gave patient my card and direct number and instructed he need to call me when ready. Patient understood that it is his responsibility to call this nurse to plan to have surgery in a month.

## 2017-05-30 NOTE — Progress Notes (Signed)
Vascular and Vein Specialist of [facility]  Patient name: [name] MRN: [id number] DOB: 01-15-1956 Sex: male  REASON FOR VISIT: Follow-up infrarenal abdominal aortic aneurysm  HPI: [name] is a 62 y.o. male here today for follow-up.  He has a known history of infrarenal abdominal aortic aneurysm which is shown progression by ultrasound over time.  He is here today with a CT scan for further evaluation.  He reports continued difficulty with his lower extremities.  He reports an achy sensation in his groins bilaterally and also extension of this into his medial thighs bilaterally.  This makes it painful when he is walking but also has these sensations when he is sitting at rest.  He has no lower extremity tissue loss.  He has no symptoms referable to his aneurysm.  He denies any prior cardiac difficulty  Past Medical History:  Diagnosis Date  . AAA (abdominal aortic aneurysm) ([place])   . AAA (abdominal aortic aneurysm) without rupture ([place])   . Arthritis   . Claudication [facility])    lower extremities  . Claustrophobia   . COLONIC POLYPS, HX OF 05/02/2007  . DVT (deep venous thrombosis) ([place])   . GERD 11/08/2006  . HYPERCHOLESTEROLEMIA, SEVERE 05/02/2007  . HYPERTENSION 11/08/2006  . LOW BACK PAIN 11/22/2007  . NEOPLASM, MALIGNANT, SKIN, 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    Family History  Problem Relation Age of Onset  . Adrenal disorder Brother   . Hyperlipidemia Brother   . Hypertension Brother   . Heart disease Brother        Heart Disease before age 65  . Heart attack Brother   . Heart disease Mother        Heart Disease before age 60  . Hyperlipidemia Mother   . Hypertension Mother   . Heart attack Mother   . Heart disease Father        Heart Disease before age 38  . Hyperlipidemia Father   . Heart attack Father   . Pulmonary embolism Sister     SOCIAL HISTORY: Social History   Tobacco Use  . Smoking status: Current Every Day Smoker   Packs/day: 0.50    Years: 30.00    Pack years: 15.00    Types: Cigarettes  . Smokeless tobacco: Never Used  Substance Use Topics  . Alcohol use: No    Alcohol/week: 0.0 oz    Allergies  Allergen Reactions  . Cortisone   . Oxycodone     Severe itching  . Sulfonamide Derivatives     REACTION: rash    Current Outpatient Medications  Medication Sig Dispense Refill  . ALPRAZolam (XANAX) 0.5 MG tablet TAKE 1 TABLET BY MOUTH TWICE A DAY AS NEEDED 180 tablet 0  . aspirin 81 MG tablet Take 81 mg by mouth daily.      . benazepril (LOTENSIN) 40 MG tablet TAKE 1 TABLET BY MOUTH EVERY DAY 90 tablet 1  . ciprofloxacin (CIPRO) 500 MG tablet Take 1 tablet (500 mg total) by mouth 2 (two) times daily. 20 tablet 0  . hydrochlorothiazide (HYDRODIURIL) 25 MG tablet TAKE 1 TABLET (25 MG TOTAL) BY MOUTH DAILY. 90 tablet 0  . Multiple Vitamin (MULTIVITAMIN) tablet Take 1 tablet by mouth daily.      Marland Kitchen omeprazole (PRILOSEC) 20 MG capsule TAKE 1 CAPSULE (20 MG TOTAL) BY MOUTH DAILY. 90 capsule 1  . pravastatin (PRAVACHOL) 40 MG tablet TAKE 2 TABLETS BY MOUTH EVERY DAY 180 tablet 1  . tamsulosin (FLOMAX) 0.4 MG  CAPS capsule TAKE ONE CAPSULE BY MOUTH EVERY DAY 90 capsule 1  . traMADol (ULTRAM) 50 MG tablet TAKE 1 TABLET BY MOUTH EVERY 6 HOURS AS NEEDED 60 tablet 0  . VITAMIN E PO Take by mouth.       No current facility-administered medications for this visit.     REVIEW OF SYSTEMS:  [X]  denotes positive finding, [ ]  denotes negative finding Cardiac  Comments:  Chest pain or chest pressure:    Shortness of breath upon exertion:    Short of breath when lying flat:    Irregular heart rhythm:        Vascular    Pain in calf, thigh, or hip brought on by ambulation: x   Pain in feet at night that wakes you up from your sleep:  x   Blood clot in your veins:    Leg swelling:           PHYSICAL EXAM: Vitals:   05/30/17 1232  BP: (!) 146/95  Pulse: 83  Resp: 20  SpO2: 96%  Weight: 214 lb (97.1  kg)  Height: 6' (1.829 m)    GENERAL: The patient is a well-nourished male, in no acute distress. The vital signs are documented above. CARDIOVASCULAR: 2+ radial and 2+ dorsalis pedis pulses bilaterally.  2-3+ femoral pulses bilaterally.  Abdomen obese and I do not palpate an aneurysm PULMONARY: There is good air exchange  MUSCULOSKELETAL: There are no major deformities or cyanosis. NEUROLOGIC: No focal weakness or paresthesias are detected. SKIN: There are no ulcers or rashes noted. PSYCHIATRIC: The patient has a normal affect.  DATA:  CT of abdomen and pelvis revealed progression of his aneurysm size to 5.5 cm cm in diameter.  He also has common iliac artery aneurysms extending down to the iliac bifurcation with common iliac and the 2-1/2 cm range bilaterally  MEDICAL ISSUES: Gust options with the patient, his wife and sister present.  With his continued aneurysm growth and young age I have recommended elective repair.  I did explain the option of open aneurysm repair versus stent graft repair.  He does have a relatively short infrarenal neck but do feel we could achieve a seal at the top.  I would require either coiling or branch device in his iliacs.  I explained that I would review his images with device manufacturer for further planning and make recommendations.  In the meantime we will have him see cardiology for preop clearance.  We will have further discussion with the patient after meeting with the device manufacturer    [name], MD [facility] Vascular and Vein Specialists of [facility] Tel [phone] Pager [phone]

## 2017-05-31 NOTE — Telephone Encounter (Signed)
OK # [phone] 

## 2017-05-31 NOTE — Telephone Encounter (Signed)
Patient will call back to schedule an office visit for refills.

## 2017-05-31 NOTE — Telephone Encounter (Signed)
Okay for refill?  

## 2017-06-01 ENCOUNTER — Ambulatory Visit: Payer: 59 | Admitting: Cardiology

## 2017-06-01 ENCOUNTER — Encounter: Payer: Self-pay | Admitting: Cardiology

## 2017-06-01 VITALS — BP 138/80 | HR 98 | Ht 73.0 in | Wt 209.4 lb

## 2017-06-01 DIAGNOSIS — I10 Essential (primary) hypertension: Secondary | ICD-10-CM

## 2017-06-01 DIAGNOSIS — E782 Mixed hyperlipidemia: Secondary | ICD-10-CM | POA: Insufficient documentation

## 2017-06-01 DIAGNOSIS — I714 Abdominal aortic aneurysm, without rupture, unspecified: Secondary | ICD-10-CM

## 2017-06-01 DIAGNOSIS — F17209 Nicotine dependence, unspecified, with unspecified nicotine-induced disorders: Secondary | ICD-10-CM | POA: Insufficient documentation

## 2017-06-01 DIAGNOSIS — I7090 Unspecified atherosclerosis: Secondary | ICD-10-CM | POA: Diagnosis not present

## 2017-06-01 DIAGNOSIS — F17210 Nicotine dependence, cigarettes, uncomplicated: Secondary | ICD-10-CM | POA: Diagnosis not present

## 2017-06-01 MED ORDER — METOPROLOL TARTRATE 25 MG PO TABS: 25.0000 mg | ORAL_TABLET | Freq: Two times a day (BID) | ORAL | 3 refills | Status: DC

## 2017-06-01 NOTE — Patient Instructions (Signed)
Medication Instructions:  Your physician has recommended you make the following change in your medication:  START metoprolol 25 mg twice daily  Labwork: None  Testing/Procedures: Your physician has requested that you have a lexiscan myoview. For further information please visit [email or web address]. Please follow instruction sheet, as given.  Follow-Up: Your physician recommends that you schedule a follow-up appointment in: 4 months  Any Other Special Instructions Will Be Listed Below (If Applicable).     If you need a refill on your cardiac medications before your next appointment, please call your pharmacy.   [place], RN, BSN

## 2017-06-01 NOTE — Progress Notes (Signed)
Cardiology Office Note:    Date:  06/01/2017   ID:  [name], DOB [birth year], MRN [id number]  PCP:  Marletta Lor, MD  Cardiologist:  Jenean Lindau, MD   Referring MD: [name], MD    ASSESSMENT:    1. ASVD (arteriosclerotic vascular disease)   2. Essential hypertension   3. Abdominal aortic aneurysm (AAA) without rupture ([place])   4. Mixed dyslipidemia   5. Cigarette smoker    PLAN:    In order of problems listed above:  1. Secondary prevention stressed with the patient.  Importance of compliance with diet and medications stressed and he vocalized understanding.  He appears to be on guideline directed medical therapy and is also on statin therapy.  He is asymptomatic from a cardiovascular standpoint but leads a sedentary lifestyle and has significant peripheral vascular disease.  In view of this I will obtain a Lexiscan sestamibi.  If this is negative then he is not at high risk for coronary events during the aforementioned surgery.  Meticulous hemodynamic monitoring and perioperative beta-blockade will further reduce the risk of coronary events.  In this respect I have 2. I spent 5 minutes with the patient discussing solely about smoking. Smoking cessation was counseled. I suggested to the patient also different medications and pharmacological interventions. Patient is keen to try stopping on its own at this time. He will get back to me if he needs any further assistance in this matter.  It is in keeping this in mind I have initiated him on beta-blocker. 3. He will be seen in follow-up appointment in 3 months or earlier if he has any concerns.   Medication Adjustments/Labs and Tests Ordered: Current medicines are reviewed at length with the patient today.  Concerns regarding medicines are outlined above.  Orders Placed This Encounter  Procedures  . MYOCARDIAL PERFUSION IMAGING  . EKG 12-Lead   Meds ordered this encounter  Medications  . metoprolol tartrate  (LOPRESSOR) 25 MG tablet    Sig: Take 1 tablet (25 mg total) by mouth 2 (two) times daily.    Dispense:  180 tablet    Refill:  3     History of Present Illness:    [name] is a 62 y.o. male who is being seen today for the evaluation of preoperative stratification for vascular intervention at the request of Early, Arvilla Meres, MD.  Patient is a pleasant 62 year old male.  He has past medical history of essential hypertension and dyslipidemia.  He leads a sedentary lifestyle.  Unfortunately he has been a heavy chronic smoker.  He has been evaluated for abdominal aortic aneurysm.  This is been diagnosed and he is contemplating surgery and he is sent here for preop risk assessment.  Again he has significant symptoms suggestive of intermittent claudication.  He denies any chest pain orthopnea or PND.  At the time of my evaluation, the patient is alert awake oriented and in no distress.  Past Medical History:  Diagnosis Date  . AAA (abdominal aortic aneurysm) ([place])   . AAA (abdominal aortic aneurysm) without rupture ([place])   . Arthritis   . Claudication [facility])    lower extremities  . Claustrophobia   . COLONIC POLYPS, HX OF 05/02/2007  . DVT (deep venous thrombosis) ([place])   . GERD 11/08/2006  . HYPERCHOLESTEROLEMIA, SEVERE 05/02/2007  . HYPERTENSION 11/08/2006  . LOW BACK PAIN 11/22/2007  . NEOPLASM, MALIGNANT, SKIN, 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    Past Surgical History:  Procedure  Laterality Date  . ANGIOPLASTY  09/20/10   Rt AK to tibioperoneal trunk BPG w/ vein patch angioplasty of tibioperoneal trunk   . carpel tunnel surgery  per pt. 6-7 years ago   bilateral  (done 2 weeks apart)  . FOOT SURGERY     right x2  . MOHS SURGERY     lower lip  . PR VEIN BYPASS GRAFT,AORTO-FEM-POP      Current Medications: Current Meds  Medication Sig  . ALPRAZolam (XANAX) 0.5 MG tablet TAKE 1 TABLET BY MOUTH TWICE A DAY AS NEEDED  . aspirin 81 MG tablet Take 81 mg by mouth daily.    . benazepril (LOTENSIN) 40  MG tablet TAKE 1 TABLET BY MOUTH EVERY DAY  . ciprofloxacin (CIPRO) 500 MG tablet Take 1 tablet (500 mg total) by mouth 2 (two) times daily.  . hydrochlorothiazide (HYDRODIURIL) 25 MG tablet TAKE 1 TABLET (25 MG TOTAL) BY MOUTH DAILY.  . Multiple Vitamin (MULTIVITAMIN) tablet Take 1 tablet by mouth daily.    Marland Kitchen omeprazole (PRILOSEC) 20 MG capsule TAKE 1 CAPSULE (20 MG TOTAL) BY MOUTH DAILY.  . pravastatin (PRAVACHOL) 40 MG tablet TAKE 2 TABLETS BY MOUTH EVERY DAY  . tamsulosin (FLOMAX) 0.4 MG CAPS capsule TAKE ONE CAPSULE BY MOUTH EVERY DAY  . traMADol (ULTRAM) 50 MG tablet TAKE 1 TABLET BY MOUTH EVERY 6 HOURS AS NEEDED  . VITAMIN E PO Take by mouth.       Allergies:   Cortisone; Oxycodone; and Sulfonamide derivatives   Social History   Socioeconomic History  . Marital status: Married    Spouse name: Not on file  . Number of children: Not on file  . Years of education: Not on file  . Highest education level: Not on file  Occupational History  . Not on file  Social Needs  . Financial resource strain: Not on file  . Food insecurity:    Worry: Not on file    Inability: Not on file  . Transportation needs:    Medical: Not on file    Non-medical: Not on file  Tobacco Use  . Smoking status: Current Every Day Smoker    Packs/day: 0.50    Years: 30.00    Pack years: 15.00    Types: Cigarettes  . Smokeless tobacco: Never Used  Substance and Sexual Activity  . Alcohol use: No    Alcohol/week: 0.0 oz  . Drug use: No  . Sexual activity: Not on file  Lifestyle  . Physical activity:    Days per week: Not on file    Minutes per session: Not on file  . Stress: Not on file  Relationships  . Social connections:    Talks on phone: Not on file    Gets together: Not on file    Attends religious service: Not on file    Active member of club or organization: Not on file    Attends meetings of clubs or organizations: Not on file    Relationship status: Not on file  Other Topics Concern   . Not on file  Social History Narrative  . Not on file     Family History: The patient's family history includes Adrenal disorder in his brother; Heart attack in his brother, father, and mother; Heart disease in his brother, father, and mother; Hyperlipidemia in his brother, father, and mother; Hypertension in his brother and mother; Pulmonary embolism in his sister.  ROS:   Please see the history of present illness.  All other systems reviewed and are negative.  EKGs/Labs/Other Studies Reviewed:    The following studies were reviewed today: I discussed my findings with the patient at extensive length CT scan  Report was reviewed by me.   Recent Labs: No results found for requested labs within last 8760 hours.  Recent Lipid Panel    Component Value Date/Time   CHOL 196 06/23/2010 1152   TRIG 556.0 (H) 06/23/2010 1152   HDL 31.00 (L) 06/23/2010 1152   CHOLHDL 6 06/23/2010 1152   VLDL 111.2 (H) 06/23/2010 1152   LDLDIRECT 86.7 06/23/2010 1152    Physical Exam:    VS:  BP 138/80 (BP Location: Right Arm, Patient Position: Sitting, Cuff Size: Normal)   Pulse 98   Ht 6\' 1"  (1.854 m)   Wt 209 lb 6.4 oz (95 kg)   SpO2 98%   BMI 27.63 kg/m     Wt Readings from Last 3 Encounters:  06/01/17 209 lb 6.4 oz (95 kg)  05/30/17 214 lb (97.1 kg)  11/29/16 215 lb (97.5 kg)     GEN: Patient is in no acute distress HEENT: Normal NECK: No JVD; No carotid bruits LYMPHATICS: No lymphadenopathy CARDIAC: S1 S2 regular, 2/6 systolic murmur at the apex. RESPIRATORY:  Clear to auscultation without rales, wheezing or rhonchi  ABDOMEN: Soft, non-tender, non-distended MUSCULOSKELETAL:  No edema; No deformity  SKIN: Warm and dry NEUROLOGIC:  Alert and oriented x 3 PSYCHIATRIC:  Normal affect    Signed, Jenean Lindau, MD  06/01/2017 11:56 AM    [place]

## 2017-06-06 ENCOUNTER — Encounter: Payer: Self-pay | Admitting: Vascular Surgery

## 2017-06-12 ENCOUNTER — Telehealth (HOSPITAL_COMMUNITY): Payer: Self-pay | Admitting: *Deleted

## 2017-06-12 NOTE — Telephone Encounter (Signed)
Patient given detailed instructions per Myocardial Perfusion Study Information Sheet for the test on 06/15/17 at 0945. Patient notified to arrive 15 minutes early and that it is imperative to arrive on time for appointment to keep from having the test rescheduled.  If you need to cancel or reschedule your appointment, please call the office within 24 hours of your appointment. . Patient verbalized understanding.[name] W    

## 2017-06-15 ENCOUNTER — Ambulatory Visit (HOSPITAL_COMMUNITY): Payer: 59 | Attending: Cardiology

## 2017-06-15 VITALS — Ht 73.0 in | Wt 209.0 lb

## 2017-06-15 DIAGNOSIS — I10 Essential (primary) hypertension: Secondary | ICD-10-CM | POA: Insufficient documentation

## 2017-06-15 DIAGNOSIS — I714 Abdominal aortic aneurysm, without rupture, unspecified: Secondary | ICD-10-CM

## 2017-06-15 DIAGNOSIS — I7090 Unspecified atherosclerosis: Secondary | ICD-10-CM | POA: Insufficient documentation

## 2017-06-15 DIAGNOSIS — I739 Peripheral vascular disease, unspecified: Secondary | ICD-10-CM

## 2017-06-15 LAB — MYOCARDIAL PERFUSION IMAGING
CHL CUP NUCLEAR SDS: 3
CHL CUP NUCLEAR SRS: 10
CHL CUP RESTING HR STRESS: 58 {beats}/min
CSEPPHR: 80 {beats}/min
LHR: 0.34
LV dias vol: 114 mL (ref 62–150)
LVSYSVOL: 53 mL
SSS: 13
TID: 0.96

## 2017-06-15 MED ORDER — TECHNETIUM TC 99M TETROFOSMIN IV KIT
32.6000 | PACK | Freq: Once | INTRAVENOUS | Status: AC | PRN
  Administered 2017-06-15: 32.6 via INTRAVENOUS
  Filled 2017-06-15: qty 33

## 2017-06-15 MED ORDER — TECHNETIUM TC 99M TETROFOSMIN IV KIT
10.9000 | PACK | Freq: Once | INTRAVENOUS | Status: AC | PRN
  Administered 2017-06-15: 10.9 via INTRAVENOUS
  Filled 2017-06-15: qty 11

## 2017-06-15 MED ORDER — REGADENOSON 0.4 MG/5ML IV SOLN
0.4000 mg | Freq: Once | INTRAVENOUS | Status: AC
  Administered 2017-06-15: 0.4 mg via INTRAVENOUS

## 2017-06-22 ENCOUNTER — Telehealth: Payer: Self-pay | Admitting: *Deleted

## 2017-06-22 ENCOUNTER — Other Ambulatory Visit: Payer: Self-pay | Admitting: *Deleted

## 2017-06-22 NOTE — Telephone Encounter (Signed)
-----   Message from Jenean Lindau, MD sent at 06/22/2017 10:14 AM EDT ----- Regarding: RE: Cardiac clearance Yes, mainly refer to my office notes also.  Based on the above studies does not seem to be high risk.  In view of his comorbidities consider him moderate risk.  Thank you for allowing [place] to participate in his care. Dr Geraldo Pitter ----- Message ----- From: Willy Eddy, RN Sent: 06/22/2017   9:23 AM To: Jenean Lindau, MD Subject: Cardiac clearance                              Based on findings and results of Lexiscan is this patient cleared to proceed with EVAR with Dr. Donnetta Hutching? I appreciate your time. Becky Rn

## 2017-06-26 ENCOUNTER — Other Ambulatory Visit: Payer: Self-pay | Admitting: Internal Medicine

## 2017-06-26 DIAGNOSIS — R300 Dysuria: Secondary | ICD-10-CM

## 2017-07-03 NOTE — Pre-Procedure Instructions (Signed)
[name]  07/03/2017      [id number] - [place], [address] [place] [address] Phone: [phone] Fax: [phone]    Your procedure is scheduled on 07-17-2017 Monday   Report to [facility] Admitting at 7:30 A.M.   Call this number if you have problems the morning of surgery:  [phone]   Remember:  Do not eat food or drink liquids after midnight.   Take these medicines the morning of surgery with A SIP OF WATER   Alprazolam(Xanax) if needed Metoprolol(Lopressor) Omeprazole(Prilosec) Pravastatin(Pravachol) Tamsulosin(Flomax) Tramadol(Ultram) if needed  STOP TAKING ANY ASPIRIN(UNLESS OTHERWISE INSTRUCTED BY YOUR SURGEON),ANTIINFLAMATORIES (IBUPROFEN,ALEVE,MOTRIN,ADVIL,GOODY'S POWDERS),HERBAL SUPPLEMENTS,FISH OIL,AND VITAMINS 5-7 DAYS PRIOR TO SURGERY   Do not wear jewelry, make-up or nail polish.  Do not wear lotions, powders, or perfumes, or deodorant.  Do not shave 48 hours prior to surgery.  Men may shave face and neck.  Do not bring valuables to the hospital.  [facility] is not responsible for any belongings or valuables.  Contacts, dentures or bridgework may not be worn into surgery.  Leave your suitcase in the car.  After surgery it may be brought to your room.  For patients admitted to the hospital, discharge time will be determined by your treatment team.  Patients discharged the day of surgery will not be allowed to drive home.    Special Instructions: [place] - Preparing for Surgery  Before surgery, you can play an important role.  Because skin is not sterile, your skin needs to be as free of germs as possible.  You can reduce the number of germs on you skin by washing with CHG (chlorahexidine gluconate) soap before surgery.  CHG is an antiseptic cleaner which kills germs and bonds with the skin to continue killing germs even after washing.  Please DO NOT use if you have an  allergy to CHG or antibacterial soaps.  If your skin becomes reddened/irritated stop using the CHG and inform your nurse when you arrive at Short Stay.  Do not shave (including legs and underarms) for at least 48 hours prior to the first CHG shower.  You may shave your face.  Please follow these instructions carefully:   1.  Shower with CHG Soap the night before surgery and the   morning of Surgery.  2.  If you choose to wash your hair, wash your hair first as usual with your normal shampoo.  3.  After you shampoo, rinse your hair and body thoroughly to remove the  Shampoo.  4.  Use CHG as you would any other liquid soap.  You can apply chg directly  to the skin and wash gently with scrungie or a clean washcloth.  5.  Apply the CHG Soap to your body ONLY FROM THE NECK DOWN.   Do not use on open wounds or open sores.  Avoid contact with your eyes,  ears, mouth and genitals (private parts).  Wash genitals (private parts) with your normal soap.  6.  Wash thoroughly, paying special attention to the area where your surgery will be performed.  7.  Thoroughly rinse your body with warm water from the neck down.  8.  DO NOT shower/wash with your normal soap after using and rinsing o  the CHG Soap.  9.  Pat yourself dry with a clean towel.            10.  Wear clean pajamas.  11.  Place clean sheets on your bed the night of your first shower and do not sleep with pets.  Day of Surgery  Do not apply any lotions/deodorants the morning of surgery.  Please wear clean clothes to the hospital/surgery center.   Please read over the following fact sheets that you were given. MRSA Information and Surgical Site Infection Prevention

## 2017-07-04 ENCOUNTER — Other Ambulatory Visit: Payer: Self-pay

## 2017-07-04 ENCOUNTER — Encounter (HOSPITAL_COMMUNITY): Payer: Self-pay

## 2017-07-04 ENCOUNTER — Encounter (HOSPITAL_COMMUNITY)
Admission: RE | Admit: 2017-07-04 | Discharge: 2017-07-04 | Disposition: A | Discharge status: Home / Self Care | Payer: 59 | Source: Ambulatory Visit | Attending: Vascular Surgery | Admitting: Vascular Surgery

## 2017-07-04 DIAGNOSIS — I714 Abdominal aortic aneurysm, without rupture: Secondary | ICD-10-CM | POA: Insufficient documentation

## 2017-07-04 DIAGNOSIS — Z0183 Encounter for blood typing: Secondary | ICD-10-CM | POA: Diagnosis not present

## 2017-07-04 DIAGNOSIS — Z01812 Encounter for preprocedural laboratory examination: Secondary | ICD-10-CM | POA: Insufficient documentation

## 2017-07-04 HISTORY — DX: Unspecified convulsions: R56.9

## 2017-07-04 HISTORY — DX: Dyspnea, unspecified: R06.00

## 2017-07-04 HISTORY — DX: Benign prostatic hyperplasia without lower urinary tract symptoms: N40.0

## 2017-07-04 HISTORY — DX: Other specified postprocedural states: Z98.890

## 2017-07-04 HISTORY — DX: Poor urinary stream: R39.12

## 2017-07-04 HISTORY — DX: Nausea with vomiting, unspecified: R11.2

## 2017-07-04 LAB — APTT: aPTT: 32 s (ref 24–36)

## 2017-07-04 LAB — COMPREHENSIVE METABOLIC PANEL WITH GFR
ALT: 24 U/L (ref 17–63)
AST: 31 U/L (ref 15–41)
Albumin: 4.3 g/dL (ref 3.5–5.0)
Alkaline Phosphatase: 39 U/L (ref 38–126)
Anion gap: 13 (ref 5–15)
BUN: 10 mg/dL (ref 6–20)
CO2: 23 mmol/L (ref 22–32)
Calcium: 9.3 mg/dL (ref 8.9–10.3)
Chloride: 96 mmol/L — ABNORMAL LOW (ref 101–111)
Creatinine, Ser: 1.4 mg/dL — ABNORMAL HIGH (ref 0.61–1.24)
GFR calc Af Amer: >60 mL/min
GFR calc non Af Amer: 52 mL/min — ABNORMAL LOW
Glucose, Bld: 99 mg/dL (ref 65–99)
Potassium: 3.2 mmol/L — ABNORMAL LOW (ref 3.5–5.1)
Sodium: 132 mmol/L — ABNORMAL LOW (ref 135–145)
Total Bilirubin: 0.7 mg/dL (ref 0.3–1.2)
Total Protein: 7.3 g/dL (ref 6.5–8.1)

## 2017-07-04 LAB — CBC
HCT: 44.5 % (ref 39.0–52.0)
Hemoglobin: 15.6 g/dL (ref 13.0–17.0)
MCH: 31.5 pg (ref 26.0–34.0)
MCHC: 35.1 g/dL (ref 30.0–36.0)
MCV: 89.9 fL (ref 78.0–100.0)
Platelets: 183 K/uL (ref 150–400)
RBC: 4.95 MIL/uL (ref 4.22–5.81)
RDW: 13.5 % (ref 11.5–15.5)
WBC: 6.9 K/uL (ref 4.0–10.5)

## 2017-07-04 LAB — PROTIME-INR
INR: 1.02
Prothrombin Time: 13.3 seconds (ref 11.4–15.2)

## 2017-07-04 LAB — TYPE AND SCREEN
ABO/RH(D): A POS
ANTIBODY SCREEN: NEGATIVE

## 2017-07-04 LAB — SURGICAL PCR SCREEN
MRSA, PCR: NEGATIVE
Staphylococcus aureus: NEGATIVE

## 2017-07-04 NOTE — Progress Notes (Signed)
Cardiologist Dr. Lennox Pippins  Cardiac clearance in Epic   Stress Test 06-15-2017  PCP Dr. Bluford Kaufmann  Pt. Unable to obtain urine specimen ,will take urine cup home and bring in urine sample DOS.

## 2017-07-10 ENCOUNTER — Other Ambulatory Visit: Payer: Self-pay | Admitting: Internal Medicine

## 2017-07-14 ENCOUNTER — Encounter (HOSPITAL_COMMUNITY): Payer: Self-pay | Admitting: Anesthesiology

## 2017-07-17 ENCOUNTER — Encounter (HOSPITAL_COMMUNITY)
Admission: RE | Disposition: A | Discharge status: Home / Self Care | Payer: Self-pay | Source: Ambulatory Visit | Attending: Vascular Surgery

## 2017-07-17 ENCOUNTER — Inpatient Hospital Stay (HOSPITAL_COMMUNITY): Payer: 59

## 2017-07-17 ENCOUNTER — Inpatient Hospital Stay (HOSPITAL_COMMUNITY): Payer: 59 | Admitting: Anesthesiology

## 2017-07-17 ENCOUNTER — Inpatient Hospital Stay (HOSPITAL_COMMUNITY)
Admission: RE | Admit: 2017-07-17 | Discharge: 2017-07-18 | DRG: 269 | Disposition: A | Discharge status: Home / Self Care | Payer: 59 | Source: Ambulatory Visit | Attending: Vascular Surgery | Admitting: Vascular Surgery

## 2017-07-17 DIAGNOSIS — K219 Gastro-esophageal reflux disease without esophagitis: Secondary | ICD-10-CM | POA: Diagnosis present

## 2017-07-17 DIAGNOSIS — I10 Essential (primary) hypertension: Secondary | ICD-10-CM | POA: Diagnosis present

## 2017-07-17 DIAGNOSIS — Z7982 Long term (current) use of aspirin: Secondary | ICD-10-CM

## 2017-07-17 DIAGNOSIS — Z79899 Other long term (current) drug therapy: Secondary | ICD-10-CM

## 2017-07-17 DIAGNOSIS — Z85828 Personal history of other malignant neoplasm of skin: Secondary | ICD-10-CM

## 2017-07-17 DIAGNOSIS — I739 Peripheral vascular disease, unspecified: Secondary | ICD-10-CM | POA: Diagnosis present

## 2017-07-17 DIAGNOSIS — F17210 Nicotine dependence, cigarettes, uncomplicated: Secondary | ICD-10-CM | POA: Diagnosis present

## 2017-07-17 DIAGNOSIS — Z86718 Personal history of other venous thrombosis and embolism: Secondary | ICD-10-CM

## 2017-07-17 DIAGNOSIS — Z9889 Other specified postprocedural states: Secondary | ICD-10-CM

## 2017-07-17 DIAGNOSIS — E7800 Pure hypercholesterolemia, unspecified: Secondary | ICD-10-CM | POA: Diagnosis present

## 2017-07-17 DIAGNOSIS — I714 Abdominal aortic aneurysm, without rupture, unspecified: Secondary | ICD-10-CM | POA: Diagnosis present

## 2017-07-17 DIAGNOSIS — I723 Aneurysm of iliac artery: Secondary | ICD-10-CM | POA: Diagnosis present

## 2017-07-17 DIAGNOSIS — N400 Enlarged prostate without lower urinary tract symptoms: Secondary | ICD-10-CM | POA: Diagnosis present

## 2017-07-17 HISTORY — PX: ABDOMINAL AORTIC ENDOVASCULAR STENT GRAFT: SHX5707

## 2017-07-17 LAB — CBC
HCT: 40.8 % (ref 39.0–52.0)
Hemoglobin: 13.6 g/dL (ref 13.0–17.0)
MCH: 31.1 pg (ref 26.0–34.0)
MCHC: 33.3 g/dL (ref 30.0–36.0)
MCV: 93.4 fL (ref 78.0–100.0)
Platelets: 161 10*3/uL (ref 150–400)
RBC: 4.37 MIL/uL (ref 4.22–5.81)
RDW: 14 % (ref 11.5–15.5)
WBC: 11.8 10*3/uL — ABNORMAL HIGH (ref 4.0–10.5)

## 2017-07-17 LAB — CREATININE, SERUM
Creatinine, Ser: 1.41 mg/dL — ABNORMAL HIGH (ref 0.61–1.24)
GFR calc Af Amer: >60 mL/min (ref >60)
GFR, EST NON AFRICAN AMERICAN: 52 mL/min — AB (ref >60)

## 2017-07-17 LAB — URINALYSIS, ROUTINE W REFLEX MICROSCOPIC
BILIRUBIN URINE: NEGATIVE
Glucose, UA: NEGATIVE mg/dL
HGB URINE DIPSTICK: NEGATIVE
KETONES UR: NEGATIVE mg/dL
Leukocytes, UA: NEGATIVE
Nitrite: NEGATIVE
PROTEIN: NEGATIVE mg/dL
Specific Gravity, Urine: 1.008 (ref 1.005–1.030)
pH: 7 (ref 5.0–8.0)

## 2017-07-17 SURGERY — INSERTION, ENDOVASCULAR STENT GRAFT, AORTA, ABDOMINAL
Anesthesia: General

## 2017-07-17 MED ORDER — MAGNESIUM SULFATE 2 GM/50ML IV SOLN: 2.0000 g | Freq: Every day | INTRAVENOUS | Status: DC | PRN

## 2017-07-17 MED ORDER — ACETAMINOPHEN 325 MG RE SUPP: 325.0000 mg | RECTAL | Status: DC | PRN

## 2017-07-17 MED ORDER — PROPOFOL 10 MG/ML IV BOLUS
INTRAVENOUS | Status: AC
  Filled 2017-07-17: qty 40

## 2017-07-17 MED ORDER — TAMSULOSIN HCL 0.4 MG PO CAPS
0.4000 mg | ORAL_CAPSULE | Freq: Every day | ORAL | Status: DC
  Administered 2017-07-18: 0.4 mg via ORAL
  Filled 2017-07-17: qty 1

## 2017-07-17 MED ORDER — ASPIRIN EC 81 MG PO TBEC
81.0000 mg | DELAYED_RELEASE_TABLET | Freq: Every day | ORAL | Status: DC
Start: 2017-07-17 — End: 2017-07-18
  Administered 2017-07-17 – 2017-07-18 (×2): 81 mg via ORAL
  Filled 2017-07-17 (×2): qty 1

## 2017-07-17 MED ORDER — PHENYLEPHRINE HCL 10 MG/ML IJ SOLN
INTRAVENOUS | Status: DC | PRN
  Administered 2017-07-17: 50 ug/min via INTRAVENOUS

## 2017-07-17 MED ORDER — LIDOCAINE HCL (CARDIAC) PF 100 MG/5ML IV SOSY
PREFILLED_SYRINGE | INTRAVENOUS | Status: DC | PRN
  Administered 2017-07-17: 30 mg via INTRAVENOUS

## 2017-07-17 MED ORDER — MIDAZOLAM HCL 2 MG/2ML IJ SOLN
INTRAMUSCULAR | Status: AC
  Filled 2017-07-17: qty 2

## 2017-07-17 MED ORDER — ONDANSETRON HCL 4 MG/2ML IJ SOLN
INTRAMUSCULAR | Status: AC
  Filled 2017-07-17: qty 2

## 2017-07-17 MED ORDER — GUAIFENESIN-DM 100-10 MG/5ML PO SYRP: 15.0000 mL | ORAL_SOLUTION | ORAL | Status: DC | PRN

## 2017-07-17 MED ORDER — DOCUSATE SODIUM 100 MG PO CAPS
100.0000 mg | ORAL_CAPSULE | Freq: Every day | ORAL | Status: DC
  Administered 2017-07-18: 100 mg via ORAL
  Filled 2017-07-17: qty 1

## 2017-07-17 MED ORDER — TRAMADOL HCL 50 MG PO TABS
50.0000 mg | ORAL_TABLET | Freq: Four times a day (QID) | ORAL | Status: DC | PRN
  Administered 2017-07-17: 50 mg via ORAL
  Filled 2017-07-17: qty 1

## 2017-07-17 MED ORDER — MIDAZOLAM HCL 5 MG/5ML IJ SOLN
INTRAMUSCULAR | Status: DC | PRN
  Administered 2017-07-17: 1 mg via INTRAVENOUS

## 2017-07-17 MED ORDER — SODIUM CHLORIDE 0.9 % IV SOLN
INTRAVENOUS | Status: DC
  Administered 2017-07-17: 15:00:00 via INTRAVENOUS

## 2017-07-17 MED ORDER — BISACODYL 5 MG PO TBEC: 5.0000 mg | DELAYED_RELEASE_TABLET | Freq: Every day | ORAL | Status: DC | PRN

## 2017-07-17 MED ORDER — MEPERIDINE HCL 50 MG/ML IJ SOLN: 6.2500 mg | INTRAMUSCULAR | Status: DC | PRN

## 2017-07-17 MED ORDER — PHENYLEPHRINE HCL 10 MG/ML IJ SOLN
INTRAMUSCULAR | Status: DC | PRN
  Administered 2017-07-17: 100 ug via INTRAVENOUS

## 2017-07-17 MED ORDER — FENTANYL CITRATE (PF) 100 MCG/2ML IJ SOLN
25.0000 ug | INTRAMUSCULAR | Status: DC | PRN
  Administered 2017-07-17: 50 ug via INTRAVENOUS

## 2017-07-17 MED ORDER — METOPROLOL TARTRATE 25 MG PO TABS
25.0000 mg | ORAL_TABLET | Freq: Two times a day (BID) | ORAL | Status: DC
  Administered 2017-07-17 – 2017-07-18 (×2): 25 mg via ORAL
  Filled 2017-07-17 (×2): qty 1

## 2017-07-17 MED ORDER — LABETALOL HCL 5 MG/ML IV SOLN
INTRAVENOUS | Status: AC
  Filled 2017-07-17: qty 4

## 2017-07-17 MED ORDER — SODIUM CHLORIDE 0.9 % IV SOLN: 500.0000 mL | Freq: Once | INTRAVENOUS | Status: DC | PRN

## 2017-07-17 MED ORDER — ALBUMIN HUMAN 5 % IV SOLN
INTRAVENOUS | Status: AC
  Administered 2017-07-17: 12.5 g via INTRAVENOUS
  Filled 2017-07-17: qty 250

## 2017-07-17 MED ORDER — FENTANYL CITRATE (PF) 100 MCG/2ML IJ SOLN
INTRAMUSCULAR | Status: DC | PRN
  Administered 2017-07-17: 50 ug via INTRAVENOUS
  Administered 2017-07-17: 100 ug via INTRAVENOUS

## 2017-07-17 MED ORDER — PRAVASTATIN SODIUM 40 MG PO TABS
80.0000 mg | ORAL_TABLET | Freq: Every day | ORAL | Status: DC
  Administered 2017-07-17: 80 mg via ORAL
  Filled 2017-07-17: qty 2

## 2017-07-17 MED ORDER — FENTANYL CITRATE (PF) 100 MCG/2ML IJ SOLN
INTRAMUSCULAR | Status: AC
  Filled 2017-07-17: qty 2

## 2017-07-17 MED ORDER — ALBUMIN HUMAN 5 % IV SOLN
12.5000 g | Freq: Once | INTRAVENOUS | Status: AC
  Administered 2017-07-17: 12.5 g via INTRAVENOUS

## 2017-07-17 MED ORDER — CEFAZOLIN SODIUM-DEXTROSE 2-4 GM/100ML-% IV SOLN
INTRAVENOUS | Status: AC
  Filled 2017-07-17: qty 100

## 2017-07-17 MED ORDER — LABETALOL HCL 5 MG/ML IV SOLN
INTRAVENOUS | Status: DC | PRN
  Administered 2017-07-17: 10 mg via INTRAVENOUS

## 2017-07-17 MED ORDER — ENOXAPARIN SODIUM 40 MG/0.4ML ~~LOC~~ SOLN: 40.0000 mg | SUBCUTANEOUS | Status: DC

## 2017-07-17 MED ORDER — SUGAMMADEX SODIUM 200 MG/2ML IV SOLN
INTRAVENOUS | Status: DC | PRN
  Administered 2017-07-17: 193.6 mg via INTRAVENOUS

## 2017-07-17 MED ORDER — ALUM & MAG HYDROXIDE-SIMETH 200-200-20 MG/5ML PO SUSP: 15.0000 mL | ORAL | Status: DC | PRN

## 2017-07-17 MED ORDER — PANTOPRAZOLE SODIUM 40 MG PO TBEC
40.0000 mg | DELAYED_RELEASE_TABLET | Freq: Every day | ORAL | Status: DC
  Administered 2017-07-17 – 2017-07-18 (×2): 40 mg via ORAL
  Filled 2017-07-17 (×2): qty 1

## 2017-07-17 MED ORDER — FENTANYL CITRATE (PF) 250 MCG/5ML IJ SOLN
INTRAMUSCULAR | Status: AC
  Filled 2017-07-17: qty 5

## 2017-07-17 MED ORDER — IODIXANOL 320 MG/ML IV SOLN
INTRAVENOUS | Status: DC | PRN
  Administered 2017-07-17: 150 mL via INTRAVENOUS

## 2017-07-17 MED ORDER — ONDANSETRON HCL 4 MG/2ML IJ SOLN: 4.0000 mg | Freq: Four times a day (QID) | INTRAMUSCULAR | Status: DC | PRN

## 2017-07-17 MED ORDER — PHENYLEPHRINE 40 MCG/ML (10ML) SYRINGE FOR IV PUSH (FOR BLOOD PRESSURE SUPPORT)
PREFILLED_SYRINGE | INTRAVENOUS | Status: AC
  Filled 2017-07-17: qty 10

## 2017-07-17 MED ORDER — LACTATED RINGERS IV SOLN
INTRAVENOUS | Status: DC | PRN
  Administered 2017-07-17 (×2): via INTRAVENOUS

## 2017-07-17 MED ORDER — HYDRALAZINE HCL 20 MG/ML IJ SOLN: 5.0000 mg | INTRAMUSCULAR | Status: DC | PRN

## 2017-07-17 MED ORDER — BENAZEPRIL HCL 40 MG PO TABS
40.0000 mg | ORAL_TABLET | Freq: Every day | ORAL | Status: DC
  Administered 2017-07-17 – 2017-07-18 (×2): 40 mg via ORAL
  Filled 2017-07-17 (×2): qty 1

## 2017-07-17 MED ORDER — PROTAMINE SULFATE 10 MG/ML IV SOLN
INTRAVENOUS | Status: DC | PRN
  Administered 2017-07-17: 10 mg via INTRAVENOUS
  Administered 2017-07-17: 20 mg via INTRAVENOUS
  Administered 2017-07-17: 10 mg via INTRAVENOUS

## 2017-07-17 MED ORDER — CEFAZOLIN SODIUM-DEXTROSE 2-4 GM/100ML-% IV SOLN
2.0000 g | INTRAVENOUS | Status: AC
  Administered 2017-07-17: 2 g via INTRAVENOUS

## 2017-07-17 MED ORDER — HYDROCHLOROTHIAZIDE 25 MG PO TABS
25.0000 mg | ORAL_TABLET | Freq: Every day | ORAL | Status: DC
  Administered 2017-07-17 – 2017-07-18 (×2): 25 mg via ORAL
  Filled 2017-07-17 (×2): qty 1

## 2017-07-17 MED ORDER — ROCURONIUM BROMIDE 50 MG/5ML IV SOLN
INTRAVENOUS | Status: AC
  Filled 2017-07-17: qty 1

## 2017-07-17 MED ORDER — OXYCODONE-ACETAMINOPHEN 5-325 MG PO TABS
1.0000 | ORAL_TABLET | ORAL | Status: DC | PRN
  Administered 2017-07-17 – 2017-07-18 (×3): 1 via ORAL
  Administered 2017-07-18: 2 via ORAL
  Filled 2017-07-17 (×3): qty 1
  Filled 2017-07-17: qty 2

## 2017-07-17 MED ORDER — CHLORHEXIDINE GLUCONATE 4 % EX LIQD: 60.0000 mL | Freq: Once | CUTANEOUS | Status: DC

## 2017-07-17 MED ORDER — ACETAMINOPHEN 325 MG PO TABS
325.0000 mg | ORAL_TABLET | ORAL | Status: DC | PRN
  Administered 2017-07-17: 650 mg via ORAL
  Filled 2017-07-17: qty 2

## 2017-07-17 MED ORDER — MIDAZOLAM HCL 2 MG/2ML IJ SOLN: 0.5000 mg | Freq: Once | INTRAMUSCULAR | Status: DC | PRN

## 2017-07-17 MED ORDER — LACTATED RINGERS IV SOLN
INTRAVENOUS | Status: DC
  Administered 2017-07-17: 50 mL/h via INTRAVENOUS

## 2017-07-17 MED ORDER — PROPOFOL 10 MG/ML IV BOLUS
INTRAVENOUS | Status: DC | PRN
  Administered 2017-07-17: 200 mg via INTRAVENOUS

## 2017-07-17 MED ORDER — ONDANSETRON HCL 4 MG/2ML IJ SOLN
INTRAMUSCULAR | Status: DC | PRN
  Administered 2017-07-17: 4 mg via INTRAVENOUS

## 2017-07-17 MED ORDER — METOPROLOL TARTRATE 5 MG/5ML IV SOLN: 2.0000 mg | INTRAVENOUS | Status: DC | PRN

## 2017-07-17 MED ORDER — ROCURONIUM BROMIDE 100 MG/10ML IV SOLN
INTRAVENOUS | Status: DC | PRN
  Administered 2017-07-17: 10 mg via INTRAVENOUS
  Administered 2017-07-17: 50 mg via INTRAVENOUS

## 2017-07-17 MED ORDER — CEFAZOLIN SODIUM-DEXTROSE 2-4 GM/100ML-% IV SOLN
2.0000 g | Freq: Three times a day (TID) | INTRAVENOUS | Status: AC
  Administered 2017-07-17 – 2017-07-18 (×2): 2 g via INTRAVENOUS
  Filled 2017-07-17 (×2): qty 100

## 2017-07-17 MED ORDER — LIDOCAINE 2% (20 MG/ML) 5 ML SYRINGE
INTRAMUSCULAR | Status: AC
  Filled 2017-07-17: qty 5

## 2017-07-17 MED ORDER — MORPHINE SULFATE (PF) 2 MG/ML IV SOLN: 1.0000 mg | INTRAVENOUS | Status: DC | PRN

## 2017-07-17 MED ORDER — ALPRAZOLAM 0.5 MG PO TABS: 0.5000 mg | ORAL_TABLET | Freq: Two times a day (BID) | ORAL | Status: DC | PRN

## 2017-07-17 MED ORDER — SODIUM CHLORIDE 0.9 % IV SOLN
INTRAVENOUS | Status: DC | PRN
  Administered 2017-07-17: 10:00:00

## 2017-07-17 MED ORDER — 0.9 % SODIUM CHLORIDE (POUR BTL) OPTIME
TOPICAL | Status: DC | PRN
  Administered 2017-07-17: 1000 mL

## 2017-07-17 MED ORDER — SODIUM CHLORIDE 0.9 % IV SOLN
INTRAVENOUS | Status: AC
  Filled 2017-07-17: qty 1.2

## 2017-07-17 MED ORDER — SUGAMMADEX SODIUM 500 MG/5ML IV SOLN
INTRAVENOUS | Status: AC
  Filled 2017-07-17: qty 5

## 2017-07-17 MED ORDER — SENNOSIDES-DOCUSATE SODIUM 8.6-50 MG PO TABS: 1.0000 | ORAL_TABLET | Freq: Every evening | ORAL | Status: DC | PRN

## 2017-07-17 MED ORDER — PROMETHAZINE HCL 25 MG/ML IJ SOLN: 6.2500 mg | INTRAMUSCULAR | Status: DC | PRN

## 2017-07-17 MED ORDER — EPHEDRINE 5 MG/ML INJ
INTRAVENOUS | Status: AC
  Filled 2017-07-17: qty 10

## 2017-07-17 MED ORDER — POTASSIUM CHLORIDE CRYS ER 20 MEQ PO TBCR: 20.0000 meq | EXTENDED_RELEASE_TABLET | Freq: Every day | ORAL | Status: DC | PRN

## 2017-07-17 MED ORDER — LABETALOL HCL 5 MG/ML IV SOLN: 10.0000 mg | INTRAVENOUS | Status: DC | PRN

## 2017-07-17 MED ORDER — SODIUM CHLORIDE 0.9 % IV SOLN: INTRAVENOUS | Status: DC

## 2017-07-17 MED ORDER — PHENOL 1.4 % MT LIQD: 1.0000 | OROMUCOSAL | Status: DC | PRN

## 2017-07-17 MED ORDER — HEPARIN SODIUM (PORCINE) 1000 UNIT/ML IJ SOLN
INTRAMUSCULAR | Status: DC | PRN
  Administered 2017-07-17: 8000 [IU] via INTRAVENOUS

## 2017-07-17 SURGICAL SUPPLY — 64 items
CANISTER SUCT 3000ML PPV (MISCELLANEOUS) ×3 IMPLANT
CATH ANGIO 5F BER2 65CM (CATHETERS) ×3 IMPLANT
CATH BEACON 5.038 65CM KMP-01 (CATHETERS) IMPLANT
CATH OMNI FLUSH .035X70CM (CATHETERS) ×3 IMPLANT
CLIP LIGATING EXTRA MED SLVR (CLIP) IMPLANT
CLIP LIGATING EXTRA SM BLUE (MISCELLANEOUS) IMPLANT
COVER PROBE W GEL 5X96 (DRAPES) ×3 IMPLANT
DERMABOND ADVANCED (GAUZE/BANDAGES/DRESSINGS) ×4
DERMABOND ADVANCED .7 DNX12 (GAUZE/BANDAGES/DRESSINGS) ×2 IMPLANT
DEVICE CLOSURE PERCLS PRGLD 6F (VASCULAR PRODUCTS) ×5 IMPLANT
DRSG TEGADERM 2-3/8X2-3/4 SM (GAUZE/BANDAGES/DRESSINGS) ×6 IMPLANT
DRYSEAL FLEXSHEATH 12FR 33CM (SHEATH) ×2
DRYSEAL FLEXSHEATH 16FR 33CM (SHEATH) ×2
DRYSEAL FLEXSHEATH 18FR 33CM (SHEATH) ×2
ELECT REM PT RETURN 9FT ADLT (ELECTROSURGICAL) ×6
ELECTRODE REM PT RTRN 9FT ADLT (ELECTROSURGICAL) ×2 IMPLANT
EXCLUDER TNK 28X14.5MMX12CM (Endovascular Graft) ×1 IMPLANT
EXCLUDER TRUNK 28X14.5MMX12CM (Endovascular Graft) ×3 IMPLANT
GAUZE SPONGE 2X2 8PLY STRL LF (GAUZE/BANDAGES/DRESSINGS) ×2 IMPLANT
GLOVE BIO SURGEON STRL SZ 6 (GLOVE) ×3 IMPLANT
GLOVE BIO SURGEON STRL SZ7 (GLOVE) ×3 IMPLANT
GLOVE BIOGEL PI IND STRL 6.5 (GLOVE) ×1 IMPLANT
GLOVE BIOGEL PI IND STRL 7.5 (GLOVE) ×1 IMPLANT
GLOVE BIOGEL PI INDICATOR 6.5 (GLOVE) ×2
GLOVE BIOGEL PI INDICATOR 7.5 (GLOVE) ×2
GLOVE SS BIOGEL STRL SZ 7.5 (GLOVE) ×1 IMPLANT
GLOVE SUPERSENSE BIOGEL SZ 7.5 (GLOVE) ×2
GOWN STRL REUS W/ TWL LRG LVL3 (GOWN DISPOSABLE) ×3 IMPLANT
GOWN STRL REUS W/TWL LRG LVL3 (GOWN DISPOSABLE) ×6
GRAFT BALLN CATH 65CM (STENTS) ×1 IMPLANT
KIT BASIN OR (CUSTOM PROCEDURE TRAY) ×3 IMPLANT
KIT TURNOVER KIT B (KITS) ×3 IMPLANT
LEG CONTRALATERAL 27X10 (Vascular Products) ×3 IMPLANT
LEG CONTRALATERAL 27X14 (Vascular Products) ×3 IMPLANT
NEEDLE PERC 18GX7CM (NEEDLE) ×3 IMPLANT
NS IRRIG 1000ML POUR BTL (IV SOLUTION) ×3 IMPLANT
PACK ENDOVASCULAR (PACKS) ×3 IMPLANT
PAD ARMBOARD 7.5X6 YLW CONV (MISCELLANEOUS) ×6 IMPLANT
PERCLOSE PROGLIDE 6F (VASCULAR PRODUCTS) ×15
SET MICROPUNCTURE 5F STIFF (MISCELLANEOUS) ×3 IMPLANT
SHEATH AVANTI 11CM 5FR (SHEATH) ×3 IMPLANT
SHEATH AVANTI 11CM 8FR (SHEATH) IMPLANT
SHEATH BRITE TIP 8FR 23CM (SHEATH) ×3 IMPLANT
SHEATH DRYSEAL FLEX 12FR 33CM (SHEATH) ×1 IMPLANT
SHEATH DRYSEAL FLEX 16FR 33CM (SHEATH) ×1 IMPLANT
SHEATH DRYSEAL FLEX 18FR 33CM (SHEATH) ×1 IMPLANT
SHEATH PINNACLE 8F 10CM (SHEATH) ×3 IMPLANT
SPONGE GAUZE 2X2 STER 10/PKG (GAUZE/BANDAGES/DRESSINGS) ×4
STAPLER VISISTAT 35W (STAPLE) IMPLANT
STENT GRAFT BALLN CATH 65CM (STENTS) ×2
STOPCOCK MORSE 400PSI 3WAY (MISCELLANEOUS) ×6 IMPLANT
SUT ETHILON 3 0 PS 1 (SUTURE) IMPLANT
SUT PROLENE 5 0 C 1 24 (SUTURE) IMPLANT
SUT VIC AB 2-0 CTX 36 (SUTURE) IMPLANT
SUT VIC AB 3-0 SH 18 (SUTURE) IMPLANT
SUT VIC AB 3-0 SH 27 (SUTURE)
SUT VIC AB 3-0 SH 27X BRD (SUTURE) IMPLANT
SUT VICRYL 4-0 PS2 18IN ABS (SUTURE) ×6 IMPLANT
SYR 30ML LL (SYRINGE) ×3 IMPLANT
TOWEL GREEN STERILE (TOWEL DISPOSABLE) ×3 IMPLANT
TRAY FOLEY MTR SLVR 16FR STAT (SET/KITS/TRAYS/PACK) ×3 IMPLANT
TUBING HIGH PRESSURE 120CM (CONNECTOR) ×3 IMPLANT
WIRE AMPLATZ SS-J .035X180CM (WIRE) ×6 IMPLANT
WIRE BENTSON .035X145CM (WIRE) ×6 IMPLANT

## 2017-07-17 NOTE — Anesthesia Procedure Notes (Signed)
Arterial Line Insertion Start/End5/08/2017 9:15 AM, 07/17/2017 9:17 AM Performed by: Lavell Luster, CRNA, CRNA  Preanesthetic checklist: patient identified, IV checked, site marked, risks and benefits discussed, surgical consent, monitors and equipment checked, pre-op evaluation and timeout performed Lidocaine 1% used for infiltration Left, radial was placed Catheter size: 20 G Hand hygiene performed , maximum sterile barriers used  and Seldinger technique used Allen's test indicative of satisfactory collateral circulation Attempts: 1 Procedure performed without using ultrasound guided technique. Following insertion, dressing applied. Post procedure assessment: normal  Patient tolerated the procedure well with no immediate complications.

## 2017-07-17 NOTE — Anesthesia Procedure Notes (Signed)
Date/Time: 07/17/2017 10:37 AM Performed by: Eligha Bridegroom, CRNA

## 2017-07-17 NOTE — Op Note (Signed)
    OPERATIVE REPORT  DATE OF SURGERY: 07/17/2017  PATIENT: [name], 62 y.o. male MRN: [id number]  DOB: 09-13-1955  PRE-OPERATIVE DIAGNOSIS: Abdominal aortic aneurysm and bilateral common iliac artery aneurysms  POST-OPERATIVE DIAGNOSIS:  Same  PROCEDURE: Aortobiiliac Gore stent graft  SURGEON:  Curt Jews, M.D.  Co-surgeon for the procedure Dr. Adele Barthel  ANESTHESIA: General  EBL: Minimal ml  Total I/O In: 1900 [I.V.:1900] Out: 425 [Urine:425]  BLOOD ADMINISTERED: None  DRAINS: None  SPECIMEN: None  COUNTS CORRECT:  YES  PLAN OF CARE: PACU  PATIENT DISPOSITION:  PACU - hemodynamically stable  PROCEDURE DETAILS: The patient was taken to the operating room placed in supine position where the area of the abdomen both groins were prepped and draped in usual sterile fashion.  Cutaneous access was obtained bilaterally by first entering the common femoral artery with 18-gauge needle and passing a guidewire centrally.  This was visualized under SonoSite ultrasound.  2 Perclose devices were placed at 10:00 and 2:00 bilaterally over the guidewires and partially deployed.  A 16 French sheath was placed on the right groin and an 65 [place] on the left groin.  Was done over the Amplatz wires after these had first been exchanged with Bentson wires.  The main body was positioned through the left 18 French sheath was placed in a cross leg configuration.  The device was positioned just below the level of the renal arteries and this was confirmed with arteriogram through a pigtail catheter.  The main body was deployed and repeat arteriogram showed that this was excellent positioning just below the renal artery takeoff.  The contralateral gate was cannulated and this was confirmed by twisting the pigtail catheter and in the aortic portion of the stent graft.  Right iliac stent graft will be dictated as a separate note by Dr. Bridgett Larsson.  The left limb of the main body was then completely  deployed.  Retrograde injection through the left femoral sheath revealed the level of the hypogastric artery takeoff.  A 27 mm x 10 cm extension was chosen and this was landed just above the hypogastric artery takeoff.  The main body was a 28 x 14 x 12 cm device.  The proximal and distal attachment and all junctions were then dilated with the M OB 37 balloon.  Final arteriogram was obtained by placing a pigtail catheter above the renal arteries and this showed excellent positioning with no evidence of endoleak.  The patient had been given 8000 units of heparin prior to placing the large sheath.  The sheath were removed and the prior placed Perclose is were used to close the puncture sites in the artery.  The Bentson wires were removed and good hemostasis was obtained.  The patient was given 50 mg of protamine to reverse the heparin.  The puncture sites were closed with 4-0 Vicryl suture.  The patient had palpable posterior tibial pulses and was transferred to the recovery room in stable condition   [name], M.D., [facility] 07/17/2017 11:46 AM

## 2017-07-17 NOTE — H&P (Signed)
Office Visit   05/30/2017 [facility] -Judge Stall, Arvilla Meres, MD    Vascular Surgery   AAA (abdominal aortic aneurysm) without rupture ([place]) +1 more    Dx   Follow-up   ; Referred by Marletta Lor, MD    Reason for Visit     Additional Documentation   Vitals:   BP 146/95 (BP Location: Left Arm, Patient Position: Sitting, Cuff Size: Normal)    Pulse 83    Resp 20    Ht 6' (1.829 m)    Wt 214 lb (97.1 kg)    SpO2 96%    BMI 29.02 kg/m    BSA 2.22 m        More Vitals    Flowsheets:   MEWS Score,    Anthropometrics,    Clinical Intake,    Vital Signs      Encounter Info:   Billing Info,    History,    Allergies,    Detailed Report       All Notes    Progress Notes by [name], MD at 05/30/2017 12:15 PM   Author: [name], MD Author Type: Physician Filed: 05/30/2017 1:41 PM  Note Status: Signed Cosign: Cosign Not Required Encounter Date: 05/30/2017  Editor: [name], MD (Physician)                                          [facility]  Patient name: [name]         MRN: [id number]        DOB: 1955-12-30            Sex: male  REASON FOR VISIT: Follow-up infrarenal abdominal aortic aneurysm  HPI: [name] is a 62 y.o. male here today for follow-up.  He has a known history of infrarenal abdominal aortic aneurysm which is shown progression by ultrasound over time.  He is here today with a CT scan for further evaluation.  He reports continued difficulty with his lower extremities.  He reports an achy sensation in his groins bilaterally and also extension of this into his medial thighs bilaterally.  This makes it painful when he is walking but also has these sensations when he is sitting at rest.  He has no lower extremity tissue loss.  He has no symptoms referable to his aneurysm.  He denies any prior cardiac difficulty      Past Medical  History:  Diagnosis Date  . AAA (abdominal aortic aneurysm) ([place])   . AAA (abdominal aortic aneurysm) without rupture ([place])   . Arthritis   . 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 Claustrophobia   . COLONIC POLYPS, HX OF 05/02/2007  . DVT (deep venous thrombosis) ([place])   . GERD 11/08/2006  . HYPERCHOLESTEROLEMIA, SEVERE 05/02/2007  . HYPERTENSION 11/08/2006  . LOW BACK PAIN 11/22/2007  . NEOPLASM, MALIGNANT, SKIN, 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         Family History  Problem Relation Age of Onset  . Adrenal disorder Brother   . Hyperlipidemia Brother   . Hypertension Brother   . Heart disease Brother        Heart Disease before age 23  . Heart attack Brother   . Heart disease Mother        Heart  Disease before age 59  . Hyperlipidemia Mother   . Hypertension Mother   . Heart attack Mother   . Heart disease Father        Heart Disease before age 71  . Hyperlipidemia Father   . Heart attack Father   . Pulmonary embolism Sister     SOCIAL HISTORY: Social History        Tobacco Use  . Smoking status: Current Every Day Smoker    Packs/day: 0.50    Years: 30.00    Pack years: 15.00    Types: Cigarettes  . Smokeless tobacco: Never Used  Substance Use Topics  . Alcohol use: No    Alcohol/week: 0.0 oz         Allergies  Allergen Reactions  . Cortisone   . Oxycodone     Severe itching  . Sulfonamide Derivatives     REACTION: rash          Current Outpatient Medications  Medication Sig Dispense Refill  . ALPRAZolam (XANAX) 0.5 MG tablet TAKE 1 TABLET BY MOUTH TWICE A DAY AS NEEDED 180 tablet 0  . aspirin 81 MG tablet Take 81 mg by mouth daily.      . benazepril (LOTENSIN) 40 MG tablet TAKE 1 TABLET BY MOUTH EVERY DAY 90 tablet 1  . ciprofloxacin (CIPRO) 500 MG tablet Take 1 tablet (500 mg total) by mouth 2 (two) times daily. 20 tablet 0  . hydrochlorothiazide (HYDRODIURIL) 25 MG tablet TAKE 1 TABLET (25 MG TOTAL) BY MOUTH  DAILY. 90 tablet 0  . Multiple Vitamin (MULTIVITAMIN) tablet Take 1 tablet by mouth daily.      Marland Kitchen omeprazole (PRILOSEC) 20 MG capsule TAKE 1 CAPSULE (20 MG TOTAL) BY MOUTH DAILY. 90 capsule 1  . pravastatin (PRAVACHOL) 40 MG tablet TAKE 2 TABLETS BY MOUTH EVERY DAY 180 tablet 1  . tamsulosin (FLOMAX) 0.4 MG CAPS capsule TAKE ONE CAPSULE BY MOUTH EVERY DAY 90 capsule 1  . traMADol (ULTRAM) 50 MG tablet TAKE 1 TABLET BY MOUTH EVERY 6 HOURS AS NEEDED 60 tablet 0  . VITAMIN E PO Take by mouth.       No current facility-administered medications for this visit.     REVIEW OF SYSTEMS:  [X]  denotes positive finding, [ ]  denotes negative finding Cardiac  Comments:  Chest pain or chest pressure:    Shortness of breath upon exertion:    Short of breath when lying flat:    Irregular heart rhythm:        Vascular    Pain in calf, thigh, or hip brought on by ambulation: x   Pain in feet at night that wakes you up from your sleep:  x   Blood clot in your veins:    Leg swelling:           PHYSICAL EXAM:    Vitals:   05/30/17 1232  BP: (!) 146/95  Pulse: 83  Resp: 20  SpO2: 96%  Weight: 214 lb (97.1 kg)  Height: 6' (1.829 m)    GENERAL: The patient is a well-nourished male, in no acute distress. The vital signs are documented above. CARDIOVASCULAR: 2+ radial and 2+ dorsalis pedis pulses bilaterally.  2-3+ femoral pulses bilaterally.  Abdomen obese and I do not palpate an aneurysm PULMONARY: There is good air exchange  MUSCULOSKELETAL: There are no major deformities or cyanosis. NEUROLOGIC: No focal weakness or paresthesias are detected. SKIN: There are no ulcers or rashes noted. PSYCHIATRIC: The patient has  a normal affect.  DATA:  CT of abdomen and pelvis revealed progression of his aneurysm size to 5.5 cm cm in diameter.  He also has common iliac artery aneurysms extending down to the iliac bifurcation with common iliac and the 2-1/2 cm range  bilaterally  MEDICAL ISSUES: Gust options with the patient, his wife and sister present.  With his continued aneurysm growth and young age I have recommended elective repair.  I did explain the option of open aneurysm repair versus stent graft repair.  He does have a relatively short infrarenal neck but do feel we could achieve a seal at the top.  I would require either coiling or branch device in his iliacs.  I explained that I would review his images with device manufacturer for further planning and make recommendations.  In the meantime we will have him see cardiology for preop clearance.  We will have further discussion with the patient after meeting with the device manufacturer    [name], MD [facility] [phone] Pager [phone]       Addendum:  The patient has been re-examined and re-evaluated.  The patient's history and physical has been reviewed and is unchanged.    ARVIS ZWAHLEN is a 62 y.o. male is being admitted with ABDOMINAL AORTIC ANEURYSM. All the risks, benefits and other treatment options have been discussed with the patient. The patient has consented to proceed with Procedure(s): ABDOMINAL AORTIC ENDOVASCULAR STENT GRAFT WITH ILIAC BRANCH DEVICE as a surgical intervention.  Mina Babula 07/17/2017 7:33 AM [facility]

## 2017-07-17 NOTE — Anesthesia Postprocedure Evaluation (Signed)
Anesthesia Post Note  Patient: [name]  Procedure(s) Performed: ABDOMINAL AORTIC ENDOVASCULAR STENT GRAFT REPAIR WITH ILIAC BRANCH DEVICE (N/A )     Patient location during evaluation: PACU Anesthesia Type: General Level of consciousness: awake and alert, oriented and patient cooperative Pain management: pain level controlled Vital Signs Assessment: post-procedure vital signs reviewed and stable Respiratory status: spontaneous breathing, nonlabored ventilation, respiratory function stable and patient connected to nasal cannula oxygen Cardiovascular status: blood pressure returned to baseline and stable Postop Assessment: no apparent nausea or vomiting Anesthetic complications: no    Last Vitals:  Vitals:   07/17/17 1334 07/17/17 1350  BP:  121/83  Pulse:  65  Resp:  15  Temp: 36.5 C (!) 36.4 C  SpO2:  95%    Last Pain:  Vitals:   07/17/17 1350  TempSrc: Oral  PainSc:                  [name],[name]

## 2017-07-17 NOTE — Transfer of Care (Signed)
Immediate Anesthesia Transfer of Care Note  Patient: [name]  Procedure(s) Performed: ABDOMINAL AORTIC ENDOVASCULAR STENT GRAFT REPAIR WITH ILIAC BRANCH DEVICE (N/A )  Patient Location: PACU  Anesthesia Type:General  Level of Consciousness: awake and alert   Airway & Oxygen Therapy: Patient Spontanous Breathing and Patient connected to nasal cannula oxygen  Post-op Assessment: Report given to RN and Post -op Vital signs reviewed and stable  Post vital signs: Reviewed and stable  Last Vitals:  Vitals Value Taken Time  BP 121/84 07/17/2017 11:40 AM  Temp    Pulse 71 07/17/2017 11:43 AM  Resp 11 07/17/2017 11:43 AM  SpO2 95 % 07/17/2017 11:43 AM  Vitals shown include unvalidated device data.  Last Pain:  Vitals:   07/17/17 0814  TempSrc:   PainSc: 0-No pain      Patients Stated Pain Goal: 1 (56/38/75 6433)  Complications: No apparent anesthesia complications

## 2017-07-17 NOTE — Op Note (Signed)
OPERATIVE NOTE   PROCEDURE: 1. right common femoral artery cannulation under ultrasound guidance 2. "Preclose" repair of right common femoral artery, i.e. proglide placement x 2 3. Placement of contralateral iliac limb (Gore 27 mm x 14 cm)  PRE-OPERATIVE DIAGNOSIS: large aortic aneurysm with moderate bilateral iliac artery aneurysms   POST-OPERATIVE DIAGNOSIS: same as above   CO-SURGEONS:  Curt Jews, MD, Adele Barthel, MD  ANESTHESIA: general  ESTIMATED BLOOD LOSS: see Dr. Luther Parody note  FINDING(S): 1.  Successful exclusion of aneurysm at end of case  2.  Endoleaks: no obvious endoleaks 3.  Bilateral patent renal arteries and internal iliac arteries 4.  Palpable pedal pulses  SPECIMEN(S):  none  INDICATIONS:   [name] is a 62 y.o. ([birth year]) male who presents with large abdominal aortic aneurysm and moderate sized bilateral internal iliac artery.  Dr. Donnetta Hutching recommended endovascular aortic repair.  The patient is aware the risks of endovascular aortic surgery include but are not limited to: bleeding, need for transfusion, infection, death, stroke, paralysis, wound complications, spinal, pelvic and bowel ischemia, extended ventilation, anaphylactic reaction to contrast, contrast induced nephropathy, embolism, and need for additional procedure to address endoleaks.  Overall, a mortality rate of 1-2% and morbidity rate of 15% was cited.  The patient is aware of the risks and agree to proceed.   DESCRIPTION: After obtaining full informed written consent, the patient was brought back to the operating room and placed supine upon the operating table.  The patient received IV antibiotics prior to induction.  A procedure time out was completed and the correct surgical site was verified.  After obtaining adequate anesthesia, the patient was prepped and draped in the standard fashion for: open or endovascular aortic procedure.  A two surgeon technique was utilized to  maintain constant control of orientation of the main body of the endograft and to expedite completion of this case.  The bulk of the details of this case can be found in Dr. Luther Parody Op Note.  I am dictating the right femoral access, preclose repair, and placement of the contralateral, right limb, extension.  Right Femoral Access Under ultrasound guidance,  the right common femoral artery was then cannulated with a 18 gauge needle.  Initially the wire would not advance, likely due to a posterior plaque that was evident on Sonosite guidance.  The wire and needle were removed and pressure held for a few minutes.  The right common femoral artery was cannulated with a 18 gauge needle and then the wire passed up into aorta.  The common femoral artery appeared patent and permanent documentation of the imaging was obtained.    The needle was exchanged for a 8-Fr dilator, which was used to dilate the subcutaneous tract and arterial puncture.  The dilator was exchanged for a Proglide device.  This was deployed at 30 degrees medial rotation.  The Proglide sutures were tagged and then the Bentson wire was reloaded in the used Proglide device.  The used device was exchanged for a new Proglide device.  The new device was deployed at 30 degrees lateral rotation.  The new Proglide sutures were tagged and then the Bentson wire was reloaded in the Proglide device.  The device was exchanged for a long 8-Fr sheath.  In this fashion, the initial stage of the "Preclose" technique repair of the common femoral artery was completed.     There continued to be greater than usual bleeding from the right sheath placement, so immediately a BER-2 catheter  was loaded over the wire and used to steer into the suprarenal aorta.  The wire was exchanged for an Amplatz wire which was placed in the descending thoracic aorta.  The right sheath was exchanged immediately for a 16-Fr Dryseal sheath which had some difficulty advancing due to  proximal common iliac artery stenosis.  The interval maneuvers are documented in Dr. Luther Parody Op Note.    Contralateral Limb Placement After contralateral gate cannulation was obtained, left anterior oblique pelvic injection was obtained to image the takeoff of the right internal iliac artery.  Based on the measurements,a 27 mm x 14 cm iliac limb was selected.  The limb was advanced through the sheath, but the device hung on the tight right common iliac artery stenosis.  The 16-Fr dilator was reloaded through the sheath and advanced through the right common iliac artery and contralateral gate.  The 27 mm x 14 cm bell bottom limb extension was deployed with adequate overlap.  The device was deployed without overlap of the right internal iliac artery.  The remainder of the case is documented in Dr. Luther Parody notes.  The right common femoral artery was repaired via the "Preclose" technique documented in his notes.  There was no further bleeding from the right groin.  The right groin incision was reapproximated with a running subcuticular stitch of 4-0 Vicryl.  The skin was cleaned, dried, and reinforced with Dermabond.   Adele Barthel, MD, FACS [facility] [place] Office: [phone] Pager: [phone]  07/17/2017, 11:22 AM

## 2017-07-17 NOTE — Anesthesia Preprocedure Evaluation (Addendum)
Anesthesia Evaluation  Patient identified by MRN, date of birth, ID band Patient awake    Reviewed: Allergy & Precautions, NPO status , Patient's Chart, lab work & pertinent test results  History of Anesthesia Complications (+) PONV  Airway Mallampati: II  TM Distance: >3 FB Neck ROM: Full    Dental  (+) Dental Advisory Given, Partial Upper, Missing, Chipped   Pulmonary Current Smoker,    breath sounds clear to auscultation       Cardiovascular hypertension, Pt. on medications and Pt. on home beta blockers (-) angina+ Peripheral Vascular Disease (AAA: Infrarenal abdominal aortic aneurysm maximal AP and) and + DVT   Rhythm:Regular Rate:Normal  4/19 Myoview: Large size, moderate intensity mostly fixed inferior, apical and apical septal perfusion defect, suggestive of artifact. LVEF 54% with normal wall motion. This is a low risk study   Neuro/Psych Seizures -,  Anxiety    GI/Hepatic Neg liver ROS, GERD  Medicated and Controlled,  Endo/Other  negative endocrine ROS  Renal/GU negative Renal ROS     Musculoskeletal  (+) Arthritis ,   Abdominal   Peds  Hematology negative hematology ROS (+)   Anesthesia Other Findings   Reproductive/Obstetrics                            Anesthesia Physical Anesthesia Plan  ASA: III  Anesthesia Plan: General   Post-op Pain Management:    Induction: Intravenous  PONV Risk Score and Plan: 2 and Ondansetron and Dexamethasone  Airway Management Planned: Oral ETT  Additional Equipment: Arterial line  Intra-op Plan:   Post-operative Plan: Extubation in OR  Informed Consent: I have reviewed the patients History and Physical, chart, labs and discussed the procedure including the risks, benefits and alternatives for the proposed anesthesia with the patient or authorized representative who has indicated his/her understanding and acceptance.   Dental advisory  given  Plan Discussed with: CRNA and Surgeon  Anesthesia Plan Comments: (Plan routine monitors, A line, GETA)        Anesthesia Quick Evaluation

## 2017-07-17 NOTE — Progress Notes (Signed)
Pt received from PACU. Groin sites level 0. Telemetry applied. VSS. CHG done. Pulses dopplerable. Pt oriented to until and room. Call light in reach. Will continue to monitor.  Clyde Canterbury, RN

## 2017-07-18 ENCOUNTER — Other Ambulatory Visit: Payer: Self-pay

## 2017-07-18 ENCOUNTER — Encounter (HOSPITAL_COMMUNITY): Payer: Self-pay | Admitting: Vascular Surgery

## 2017-07-18 LAB — CBC
HEMATOCRIT: 38.6 % — AB (ref 39.0–52.0)
Hemoglobin: 12.9 g/dL — ABNORMAL LOW (ref 13.0–17.0)
MCH: 31 pg (ref 26.0–34.0)
MCHC: 33.4 g/dL (ref 30.0–36.0)
MCV: 92.8 fL (ref 78.0–100.0)
PLATELETS: 156 10*3/uL (ref 150–400)
RBC: 4.16 MIL/uL — ABNORMAL LOW (ref 4.22–5.81)
RDW: 14.1 % (ref 11.5–15.5)
WBC: 9.9 10*3/uL (ref 4.0–10.5)

## 2017-07-18 LAB — BASIC METABOLIC PANEL
Anion gap: 10 (ref 5–15)
BUN: 8 mg/dL (ref 6–20)
CALCIUM: 8.5 mg/dL — AB (ref 8.9–10.3)
CO2: 28 mmol/L (ref 22–32)
Chloride: 99 mmol/L — ABNORMAL LOW (ref 101–111)
Creatinine, Ser: 1.46 mg/dL — ABNORMAL HIGH (ref 0.61–1.24)
GFR calc Af Amer: 58 mL/min — ABNORMAL LOW (ref >60)
GFR, EST NON AFRICAN AMERICAN: 50 mL/min — AB (ref >60)
GLUCOSE: 100 mg/dL — AB (ref 65–99)
Potassium: 3 mmol/L — ABNORMAL LOW (ref 3.5–5.1)
Sodium: 137 mmol/L (ref 135–145)

## 2017-07-18 NOTE — Care Management Note (Signed)
Case Management Note Marvetta Gibbons RN, BSN Unit 4E-Case Manager [phone]    Patient Details  Name: [name] MRN: [id number] Date of Birth: 1955/11/26  Subjective/Objective:   Pt admitted s/p AAA repair                 Action/Plan: PTA pt lived at home- plan to return home today- no CM needs noted for transition home.   Expected Discharge Date:  07/18/17               Expected Discharge Plan:  Home/Self Care  In-House Referral:  NA  Discharge planning Services  CM Consult, NA  Post Acute Care Choice:  NA Choice offered to:  NA  DME Arranged:    DME Agency:     HH Arranged:    HH Agency:     Status of Service:  Completed, signed off  If discussed at [place] of Stay Meetings, dates discussed:    Discharge Disposition: home/self care   Additional Comments:  Dawayne Patricia, RN 07/18/2017, 11:09 AM

## 2017-07-18 NOTE — Progress Notes (Signed)
Patient educated on ambulation prior to discharge home. Patient currently has a signed order for discharge home. Patient experienced nausea with scant vomiting this AM. Patient given ginger ale and saltine crackers with slight relief noted. Patient refused ambulation x 2 attempts this AM due to c/o headache noted. Patient stated that this is his normal response to anesthesia post-op. Patient given Percocet (2) tabs this AM due to c/o bilateral groin pain rated at 7/10. Patient states that pain is described as a dull pulling sensation which got worse with ambulation to bathroom. Patient voided x 2 this AM post urinary catheter removal. Patient states that he has had 1 medium and 1 small BM this morning upon awakening. Bowel sounds intact to all 4 quadrants. Patient education provided for ambulation x 3 today per MD orders.

## 2017-07-18 NOTE — Progress Notes (Addendum)
  Progress Note    07/18/2017 7:47 AM 1 Day Post-Op  Subjective:  Denies new or changing abd or back pain.  BM this morning.  Minimal soreness to groin cath sites   Vitals:   07/17/17 2359 07/18/17 0333  BP: 110/74 128/80  Pulse:  70  Resp:  16  Temp: 98.5 F (36.9 C) 98.4 F (36.9 C)  SpO2:  92%   Physical Exam: Lungs:  Non labored Incisions:  Groin cath sites soft without hematoma, ecchymosis, or palpable pseudo Extremities:  Palpable and symmetrical PT pulses Abdomen:  Soft Neurologic: A&O  CBC    Component Value Date/Time   WBC 9.9 07/18/2017 0400   RBC 4.16 (L) 07/18/2017 0400   HGB 12.9 (L) 07/18/2017 0400   HGB 15.9 06/05/2013 1059   HCT 38.6 (L) 07/18/2017 0400   HCT 47.0 06/05/2013 1059   PLT 156 07/18/2017 0400   PLT 417 06/05/2013 1059   MCV 92.8 07/18/2017 0400   MCV 92 06/05/2013 1059   MCH 31.0 07/18/2017 0400   MCHC 33.4 07/18/2017 0400   RDW 14.1 07/18/2017 0400   RDW 13.3 06/05/2013 1059   LYMPHSABS 1.3 06/05/2013 1059   MONOABS 1.2 (H) 06/05/2013 1059   EOSABS 0.2 06/05/2013 1059   BASOSABS 0.1 06/05/2013 1059    BMET    Component Value Date/Time   NA 137 07/18/2017 0400   NA 129 (L) 06/05/2013 1059   K 3.0 (L) 07/18/2017 0400   K 4.0 06/05/2013 1059   CL 99 (L) 07/18/2017 0400   CL 97 (L) 06/05/2013 1059   CO2 28 07/18/2017 0400   CO2 24 06/05/2013 1059   GLUCOSE 100 (H) 07/18/2017 0400   GLUCOSE 104 (H) 06/05/2013 1059   BUN 8 07/18/2017 0400   BUN 21 (H) 06/05/2013 1059   CREATININE 1.46 (H) 07/18/2017 0400   CREATININE 1.50 (H) 07/22/2014 1240   CALCIUM 8.5 (L) 07/18/2017 0400   CALCIUM 9.4 06/05/2013 1059   GFRNONAA 50 (L) 07/18/2017 0400   GFRNONAA 46 (L) 06/05/2013 1059   GFRAA 58 (L) 07/18/2017 0400   GFRAA 53 (L) 06/05/2013 1059    INR    Component Value Date/Time   INR 1.02 07/04/2017 1417     Intake/Output Summary (Last 24 hours) at 07/18/2017 0747 Last data filed at 07/18/2017 0605 Gross per 24 hour  Intake  2484 ml  Output 2025 ml  Net 459 ml     Assessment/Plan:  62 y.o. male is s/p Aortobiiliac Gore stent graft   1 Day Post-Op   D/c foley Patent stent graft with palpable PTA BLE D/c If able to void, ambulating without difficulty, and tolerating regular diet CTA abd/pelvis in 4 weeks with office follow up    Dagoberto Ligas, PA-C [facility] [phone] 07/18/2017 7:47 AM   I have examined the patient, reviewed and agree with above.  Comfortable.  And discharged home today.  Curt Jews, MD 07/18/2017 7:59 AM

## 2017-07-18 NOTE — Progress Notes (Signed)
Patient discharge order received. Patient information for discharge reviewed with patient and spouse to include: follow-up appointments, medications, s/s of when to notify doctor, and wound care. Peripheral IV sites discontinued from right hand and left lateral forearm with IV catheter tips intact. VS stable. No acute distress. Patient c/o mild nausea. Ginger Ale given per patient request. Emesis bag at bedside for patient to use on transport home. Awaiting family transport to arrive.

## 2017-07-20 ENCOUNTER — Other Ambulatory Visit: Payer: Self-pay | Admitting: *Deleted

## 2017-07-20 ENCOUNTER — Telehealth: Payer: Self-pay | Admitting: Vascular Surgery

## 2017-07-20 ENCOUNTER — Other Ambulatory Visit: Payer: Self-pay

## 2017-07-20 DIAGNOSIS — I714 Abdominal aortic aneurysm, without rupture, unspecified: Secondary | ICD-10-CM

## 2017-07-20 DIAGNOSIS — Z48812 Encounter for surgical aftercare following surgery on the circulatory system: Secondary | ICD-10-CM

## 2017-07-20 MED ORDER — TRAMADOL HCL 50 MG PO TABS: 50.0000 mg | ORAL_TABLET | Freq: Four times a day (QID) | ORAL | 0 refills | Status: DC | PRN

## 2017-07-20 NOTE — Telephone Encounter (Signed)
Sched appts 08/22/17; CTA at 12:10 at [place] and MD at 1:45. Spoke to pt to inform them of appts and cta instructions.

## 2017-07-20 NOTE — Discharge Summary (Signed)
EVAR Discharge Summary   [name] 01/13/1956 62 y.o. male  MRN: [id number]  Admission Date: 07/17/2017  Discharge Date: 07/18/17  Physician: Dr. Donnetta Hutching  Admission Diagnosis: AAA (abdominal aortic aneurysm) [facility]) [I71.4]  Discharge Day services:    See progress note 07/18/17 Physical Exam: Vitals:   07/18/17 0333 07/18/17 0746  BP: 128/80 139/79  Pulse: 70 76  Resp: 16 17  Temp: 98.4 F (36.9 C) 97.7 F (36.5 C)  SpO2: 92% 93%   Hospital Course:  The patient was admitted to the hospital and taken to the operating room on 07/17/2017 and underwent: Aortobi-iliac stent graft by Dr. Donnetta Hutching and co-surgeon Dr. Bridgett Larsson.    The pt tolerated the procedure well and was transported to the PACU in good condition.   The remainder of the hospital course consisted of increasing mobilization and increasing intake of solids without difficulty.  POD#1 patient was able to void, tolerate a home diet, and ambulate without difficulty.  He will follow up in office in about 4 weeks with CTA abd/pelvis.  He is already on tramadol and was made aware that he will not be given an additional prescription.  Discharge instructions were reviewed with the patient and he voiced his understanding.  He was discharged in stable condition.  CBC    Component Value Date/Time   WBC 9.9 07/18/2017 0400   RBC 4.16 (L) 07/18/2017 0400   HGB 12.9 (L) 07/18/2017 0400   HGB 15.9 06/05/2013 1059   HCT 38.6 (L) 07/18/2017 0400   HCT 47.0 06/05/2013 1059   PLT 156 07/18/2017 0400   PLT 417 06/05/2013 1059   MCV 92.8 07/18/2017 0400   MCV 92 06/05/2013 1059   MCH 31.0 07/18/2017 0400   MCHC 33.4 07/18/2017 0400   RDW 14.1 07/18/2017 0400   RDW 13.3 06/05/2013 1059   LYMPHSABS 1.3 06/05/2013 1059   MONOABS 1.2 (H) 06/05/2013 1059   EOSABS 0.2 06/05/2013 1059   BASOSABS 0.1 06/05/2013 1059    BMET    Component Value Date/Time   NA 137 07/18/2017 0400   NA 129 (L) 06/05/2013 1059   K 3.0 (L) 07/18/2017 0400     K 4.0 06/05/2013 1059   CL 99 (L) 07/18/2017 0400   CL 97 (L) 06/05/2013 1059   CO2 28 07/18/2017 0400   CO2 24 06/05/2013 1059   GLUCOSE 100 (H) 07/18/2017 0400   GLUCOSE 104 (H) 06/05/2013 1059   BUN 8 07/18/2017 0400   BUN 21 (H) 06/05/2013 1059   CREATININE 1.46 (H) 07/18/2017 0400   CREATININE 1.50 (H) 07/22/2014 1240   CALCIUM 8.5 (L) 07/18/2017 0400   CALCIUM 9.4 06/05/2013 1059   GFRNONAA 50 (L) 07/18/2017 0400   GFRNONAA 46 (L) 06/05/2013 1059   GFRAA 58 (L) 07/18/2017 0400   GFRAA 53 (L) 06/05/2013 1059    Discharge Diagnosis:  AAA (abdominal aortic aneurysm) ([place]) [I71.4]  Secondary Diagnosis: Patient Active Problem List   Diagnosis Date Noted  . AAA (abdominal aortic aneurysm) ([place]) 07/17/2017  . Mixed dyslipidemia 06/01/2017  . Cigarette smoker 06/01/2017  . BPH (benign prostatic hyperplasia) 09/22/2015  . Abdominal aortic aneurysm ([place]) 06/12/2013  . Abdominal pain, other specified site 05/22/2012  . Peripheral vascular disease, unspecified ([place]) 04/26/2011  . LOW BACK PAIN 11/22/2007  . 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 COLONIC POLYPS, HX OF 05/02/2007  . Essential hypertension 11/08/2006  . GERD 11/08/2006   Past Medical History:  Diagnosis Date  . AAA (abdominal aortic aneurysm) (  [place])   . AAA (abdominal aortic aneurysm) without rupture ([place])   . Arthritis   . BPH (benign prostatic hyperplasia)   . Claudication [facility])    lower extremities  . Claustrophobia    panic attacks  . COLONIC POLYPS, HX OF 05/02/2007  . DVT (deep venous thrombosis) ([place])   . Dyspnea    with exertion  . GERD 11/08/2006  . HYPERCHOLESTEROLEMIA, SEVERE 05/02/2007  . HYPERTENSION 11/08/2006  . LOW BACK PAIN 11/22/2007  . 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 PONV (postoperative nausea and vomiting)   . Seizures ([place])    from concussion several months ago has been having headache  . Weak urine stream    Slow to get started     Allergies as of 07/18/2017       Reactions   Cortisone    Oxycodone    Severe itching   Sulfonamide Derivatives    REACTION: rash      Medication List    TAKE these medications   ALPRAZolam 0.5 MG tablet Commonly known as:  XANAX TAKE 1 TABLET BY MOUTH TWICE A DAY AS NEEDED   aspirin 81 MG tablet Take 81 mg by mouth daily.   benazepril 40 MG tablet Commonly known as:  LOTENSIN TAKE 1 TABLET BY MOUTH EVERY DAY   hydrochlorothiazide 25 MG tablet Commonly known as:  HYDRODIURIL TAKE 1 TABLET (25 MG TOTAL) BY MOUTH DAILY.   metoprolol tartrate 25 MG tablet Commonly known as:  LOPRESSOR Take 1 tablet (25 mg total) by mouth 2 (two) times daily.   multivitamin tablet Take 1 tablet by mouth daily.   omeprazole 20 MG capsule Commonly known as:  PRILOSEC TAKE 1 CAPSULE (20 MG TOTAL) BY MOUTH DAILY. Notes to patient:  Please schedule an office visit for more refills   pravastatin 40 MG tablet Commonly known as:  PRAVACHOL TAKE 2 TABLETS BY MOUTH EVERY DAY   tamsulosin 0.4 MG Caps capsule Commonly known as:  FLOMAX TAKE ONE CAPSULE BY MOUTH EVERY DAY   traMADol 50 MG tablet Commonly known as:  ULTRAM TAKE 1 TABLET BY MOUTH EVERY 6 HOURS AS NEEDED       Discharge Instructions:  [facility]  Discharge Instructions Endovascular Aortic Aneurysm Repair  Please refer to the following instructions for your post-procedure care. Your surgeon or Physician Assistant will discuss any changes with you.  Activity  You are encouraged to walk as much as you can. You can slowly return to normal activities but must avoid strenuous activity and heavy lifting until your doctor tells you it's OK. Avoid activities such as vacuuming or swinging a gold club. It is normal to feel tired for several weeks after your surgery. Do not drive until your doctor gives the OK and you are no longer taking prescription pain medications. It is also normal to have difficulty with sleep habits,  eating, and bowel movements after surgery. These will go away with time.  Bathing/Showering  You may shower after you go home. If you have an incision, do not soak in a bathtub, hot tub, or swim until the incision heals completely.  Incision Care  Shower every day. Clean your incision with mild soap and water. Pat the area dry with a clean towel. You do not need a bandage unless otherwise instructed. Do not apply any ointments or creams to your incision. If you clothing is irritating, you may cover your incision with a dry gauze pad.  Diet  Resume your normal diet. There are no special food restrictions following this procedure. A low fat/low cholesterol diet is recommended for all patients with vascular disease. In order to heal from your surgery, it is CRITICAL to get adequate nutrition. Your body requires vitamins, minerals, and protein. Vegetables are the best source of vitamins and minerals. Vegetables also provide the perfect balance of protein. Processed food has little nutritional value, so try to avoid this.  Medications  Resume taking all of your medications unless your doctor or Physician Assistnat tells you not to. If your incision is causing pain, you may take over-the-counter pain relievers such as acetaminophen (Tylenol). If you were prescribed a stronger pain medication, please be aware these medications can cause nausea and constipation. Prevent nausea by taking the medication with a snack or meal. Avoid constipation by drinking plenty of fluids and eating foods with a high amount of fiber, such as fruits, vegetables, and grains. Do not take Tylenol if you are taking prescription pain medications.   Follow up  [place] office will schedule a follow-up appointment with a C.T. scan 3-4 weeks after your surgery.  Please call us immediately for any of the following conditions  Severe or worsening pain in your legs or feet or in your abdomen back or chest. Increased pain, redness,  drainage (pus) from your incision sit. Increased abdominal pain, bloating, nausea, vomiting or persistent diarrhea. Fever of 101 degrees or higher. Swelling in your leg (s),  Reduce your risk of vascular disease  .Stop smoking. If you would like help call [facility] at 1-800-QUIT-NOW 431-384-1556) or [place] at [phone]. .Manage your cholesterol .Maintain a desired weight .Control your diabetes .Keep your blood pressure down  If you have questions, please call the office at [phone].  Disposition: home  Patient's condition: is Good  Follow up: 1. Dr. Donnetta Hutching in 4 weeks with CTA protocol   Dagoberto Ligas, PA-C [facility] [phone] 07/20/2017  8:43 AM   - For VQI Registry use - Post-op:  Time to Extubation: [x]  In OR, [ ]  < 12 hrs, [ ]  12-24 hrs, [ ]  >=24 hrs Vasopressors Req. Post-op: No MI: No., [ ]  Troponin only, [ ]  EKG or Clinical New Arrhythmia: No CHF: No ICU Stay: no Transfusion: No     If yes,  units given  Complications: Resp failure: No., [ ]  Pneumonia, [ ]  Ventilator Chg in renal function: No., [ ]  Inc. Cr > 0.5, [ ]  Temp. Dialysis,  [ ]  Permanent dialysis Leg ischemia: No., no Surgery needed, [ ]  Yes, Surgery needed,  [ ]  Amputation Bowel ischemia: No., [ ]  Medical Rx, [ ]  Surgical Rx Wound complication: No., [ ]  Superficial separation/infection, [ ]  Return to OR Return to OR: No  Return to OR for bleeding: No Stroke: No., [ ]  Minor, [ ]  Major  Discharge medications: Statin use:  Yes  ASA use:  Yes  Plavix use:  No  Beta blocker use:  Yes  ARB use:  No ACEI use:  Yes CCB use:  No

## 2017-07-20 NOTE — Telephone Encounter (Signed)
-----   Message from Mena Goes, RN sent at 07/18/2017  1:42 PM EDT ----- Regarding: cta abd/pelvis and see Dr. Donnetta Hutching in 4 weeks   ----- Message ----- From: Iline Oven Sent: 07/18/2017   8:12 AM To: V[facility] Charge Pool  Can you schedule a CTA abd/pelvis and to see Dr. Donnetta Hutching in 4 weeks.  PO [facility]AR. Thanks, [facility]

## 2017-08-06 ENCOUNTER — Other Ambulatory Visit: Payer: Self-pay | Admitting: Internal Medicine

## 2017-08-08 ENCOUNTER — Other Ambulatory Visit: Payer: Self-pay | Admitting: Internal Medicine

## 2017-08-09 ENCOUNTER — Other Ambulatory Visit: Payer: Self-pay | Admitting: Internal Medicine

## 2017-08-16 ENCOUNTER — Ambulatory Visit: Payer: 59 | Admitting: Internal Medicine

## 2017-08-16 ENCOUNTER — Encounter: Payer: Self-pay | Admitting: Internal Medicine

## 2017-08-16 VITALS — BP 120/68 | HR 68 | Temp 97.8°F | Wt 211.0 lb

## 2017-08-16 DIAGNOSIS — I739 Peripheral vascular disease, unspecified: Secondary | ICD-10-CM

## 2017-08-16 DIAGNOSIS — R0600 Dyspnea, unspecified: Secondary | ICD-10-CM

## 2017-08-16 DIAGNOSIS — N182 Chronic kidney disease, stage 2 (mild): Secondary | ICD-10-CM

## 2017-08-16 DIAGNOSIS — I10 Essential (primary) hypertension: Secondary | ICD-10-CM | POA: Diagnosis not present

## 2017-08-16 DIAGNOSIS — E782 Mixed hyperlipidemia: Secondary | ICD-10-CM

## 2017-08-16 DIAGNOSIS — N1832 Chronic kidney disease, stage 3b: Secondary | ICD-10-CM | POA: Insufficient documentation

## 2017-08-16 LAB — COMPREHENSIVE METABOLIC PANEL
ALT: 27 U/L (ref 0–53)
AST: 19 U/L (ref 0–37)
Albumin: 4.7 g/dL (ref 3.5–5.2)
Alkaline Phosphatase: 44 U/L (ref 39–117)
BUN: 16 mg/dL (ref 6–23)
CALCIUM: 9.8 mg/dL (ref 8.4–10.5)
CHLORIDE: 97 meq/L (ref 96–112)
CO2: 28 mEq/L (ref 19–32)
Creatinine, Ser: 1.52 mg/dL — ABNORMAL HIGH (ref 0.40–1.50)
GFR: 49.61 mL/min — AB (ref >60.00)
Glucose, Bld: 102 mg/dL — ABNORMAL HIGH (ref 70–99)
Potassium: 3.9 mEq/L (ref 3.5–5.1)
Sodium: 135 mEq/L (ref 135–145)
Total Bilirubin: 0.5 mg/dL (ref 0.2–1.2)
Total Protein: 7.3 g/dL (ref 6.0–8.3)

## 2017-08-16 LAB — CBC WITH DIFFERENTIAL/PLATELET
BASOS PCT: 0.4 % (ref 0.0–3.0)
Basophils Absolute: 0 10*3/uL (ref 0.0–0.1)
EOS PCT: 5.9 % — AB (ref 0.0–5.0)
Eosinophils Absolute: 0.4 10*3/uL (ref 0.0–0.7)
HEMATOCRIT: 43.4 % (ref 39.0–52.0)
Hemoglobin: 15 g/dL (ref 13.0–17.0)
LYMPHS PCT: 27.5 % (ref 12.0–46.0)
Lymphs Abs: 2 10*3/uL (ref 0.7–4.0)
MCHC: 34.6 g/dL (ref 30.0–36.0)
MCV: 92.2 fl (ref 78.0–100.0)
MONOS PCT: 12.5 % — AB (ref 3.0–12.0)
Monocytes Absolute: 0.9 10*3/uL (ref 0.1–1.0)
NEUTROS ABS: 4 10*3/uL (ref 1.4–7.7)
Neutrophils Relative %: 53.7 % (ref 43.0–77.0)
PLATELETS: 177 10*3/uL (ref 150.0–400.0)
RBC: 4.7 Mil/uL (ref 4.22–5.81)
RDW: 14.6 % (ref 11.5–15.5)
WBC: 7.4 10*3/uL (ref 4.0–10.5)

## 2017-08-16 MED ORDER — AMLODIPINE BESYLATE 5 MG PO TABS: 5.0000 mg | ORAL_TABLET | Freq: Every day | ORAL | 3 refills | Status: DC

## 2017-08-16 MED ORDER — TRAMADOL HCL 50 MG PO TABS: 50.0000 mg | ORAL_TABLET | Freq: Four times a day (QID) | ORAL | 0 refills | Status: DC | PRN

## 2017-08-16 NOTE — Progress Notes (Signed)
Subjective:    Patient ID: [name], male    DOB: December 15, 1955, 62 y.o.   MRN: [id number]  HPI  62 year old patient is seen today in follow-up.   He is 1 month status post aorto by iliac stent grafting without complications.  He was discharged on the first postop day. He was placed on metoprolol 25 mg twice daily.  Since that time he has had worsening shortness of breath.  He did have a preoperative nuclear stress test that revealed normal ejection fraction and was a low risk study.  He has hypertension. Laboratory studies were reviewed that revealed significant hypokalemia.  He is on potassium supplement as well as ongoing diuretic therapy  He continues to smoke.  Chest x-ray last month revealed some chronic bronchitic changes.  Past Medical History:  Diagnosis Date  . AAA (abdominal aortic aneurysm) ([place])   . AAA (abdominal aortic aneurysm) without rupture ([place])   . Arthritis   . BPH (benign prostatic hyperplasia)   . Claudication [facility])    lower extremities  . Claustrophobia    panic attacks  . COLONIC POLYPS, HX OF 05/02/2007  . DVT (deep venous thrombosis) ([place])   . Dyspnea    with exertion  . GERD 11/08/2006  . HYPERCHOLESTEROLEMIA, SEVERE 05/02/2007  . HYPERTENSION 11/08/2006  . LOW BACK PAIN 11/22/2007  . 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 PONV (postoperative nausea and vomiting)   . Seizures ([place])    from concussion several months ago has been having headache  . Weak urine stream    Slow to get started     Social History   Socioeconomic History  . Marital status: Married    Spouse name: Not on file  . Number of children: Not on file  . Years of education: Not on file  . Highest education level: Not on file  Occupational History  . Not on file  Social Needs  . Financial resource strain: Not on file  . Food insecurity:    Worry: Not on file    Inability: Not on file  . Transportation needs:    Medical: Not on file    Non-medical: Not on file    Tobacco Use  . Smoking status: Current Every Day Smoker    Packs/day: 1.00    Years: 40.00    Pack years: 40.00    Types: Cigarettes  . Smokeless tobacco: Never Used  Substance and Sexual Activity  . Alcohol use: No    Alcohol/week: 0.0 oz  . Drug use: No  . Sexual activity: Not on file  Lifestyle  . Physical activity:    Days per week: Not on file    Minutes per session: Not on file  . Stress: Not on file  Relationships  . Social connections:    Talks on phone: Not on file    Gets together: Not on file    Attends religious service: Not on file    Active member of club or organization: Not on file    Attends meetings of clubs or organizations: Not on file    Relationship status: Not on file  . Intimate partner violence:    Fear of current or ex partner: Not on file    Emotionally abused: Not on file    Physically abused: Not on file    Forced sexual activity: Not on file  Other Topics Concern  . Not on file  Social History Narrative  . Not on file  Past Surgical History:  Procedure Laterality Date  . ABDOMINAL AORTIC ENDOVASCULAR STENT GRAFT N/A 07/17/2017   Procedure: ABDOMINAL AORTIC ENDOVASCULAR STENT GRAFT REPAIR WITH ILIAC BRANCH DEVICE;  Surgeon: [name], MD;  Location: [facility] OR;  Service: Vascular;  Laterality: N/A;  . ANGIOPLASTY  09/20/10   Rt AK to tibioperoneal trunk BPG w/ vein patch angioplasty of tibioperoneal trunk   . carpel tunnel surgery  per pt. 6-7 years ago   bilateral  (done 2 weeks apart)  . FOOT SURGERY     right x2  . MOHS SURGERY     lower lip  . PR VEIN BYPASS GRAFT,AORTO-FEM-POP      Family History  Problem Relation Age of Onset  . Adrenal disorder Brother   . Hyperlipidemia Brother   . Hypertension Brother   . Heart disease Brother        Heart Disease before age 1  . Heart attack Brother   . Heart disease Mother        Heart Disease before age 42  . Hyperlipidemia Mother   . Hypertension Mother   . Heart attack Mother    . Heart disease Father        Heart Disease before age 62  . Hyperlipidemia Father   . Heart attack Father   . Pulmonary embolism Sister     Allergies  Allergen Reactions  . Cortisone   . Oxycodone     Severe itching  . Sulfonamide Derivatives     REACTION: rash    Current Outpatient Medications on File Prior to Visit  Medication Sig Dispense Refill  . ALPRAZolam (XANAX) 0.5 MG tablet TAKE 1 TABLET BY MOUTH TWICE A DAY AS NEEDED 180 tablet 0  . aspirin 81 MG tablet Take 81 mg by mouth daily.      . benazepril (LOTENSIN) 40 MG tablet TAKE 1 TABLET BY MOUTH EVERY DAY 90 tablet 1  . hydrochlorothiazide (HYDRODIURIL) 25 MG tablet TAKE 1 TABLET (25 MG TOTAL) BY MOUTH DAILY. 90 tablet 0  . Multiple Vitamin (MULTIVITAMIN) tablet Take 1 tablet by mouth daily.      Marland Kitchen omeprazole (PRILOSEC) 20 MG capsule TAKE 1 CAPSULE BY MOUTH EVERY DAY 90 capsule 1  . pravastatin (PRAVACHOL) 40 MG tablet TAKE 2 TABLETS BY MOUTH EVERY DAY 180 tablet 1  . tamsulosin (FLOMAX) 0.4 MG CAPS capsule TAKE ONE CAPSULE BY MOUTH EVERY DAY 90 capsule 1   No current facility-administered medications on file prior to visit.     BP 120/68 (BP Location: Right Arm, Patient Position: Sitting, Cuff Size: Large)   Pulse 68   Temp 97.8 F (36.6 C) (Oral)   Wt 211 lb (95.7 kg)   SpO2 96%   BMI 27.84 kg/m     Review of Systems  Constitutional: Negative for appetite change, chills, fatigue and fever.  HENT: Negative for congestion, dental problem, ear pain, hearing loss, sore throat, tinnitus, trouble swallowing and voice change.   Eyes: Negative for pain, discharge and visual disturbance.  Respiratory: Positive for shortness of breath. Negative for cough, chest tightness, wheezing and stridor.   Cardiovascular: Negative for chest pain, palpitations and leg swelling.  Gastrointestinal: Negative for abdominal distention, abdominal pain, blood in stool, constipation, diarrhea, nausea and vomiting.  Genitourinary:  Negative for difficulty urinating, discharge, flank pain, genital sores, hematuria and urgency.  Musculoskeletal: Negative for arthralgias, back pain, gait problem, joint swelling, myalgias and neck stiffness.  Skin: Negative for rash.  Neurological: Negative for dizziness,  syncope, speech difficulty, weakness, numbness and headaches.  Hematological: Negative for adenopathy. Does not bruise/bleed easily.  Psychiatric/Behavioral: Negative for behavioral problems and dysphoric mood. The patient is not nervous/anxious.        Objective:   Physical Exam  Constitutional: He is oriented to person, place, and time. He appears well-developed.  Blood pressure 144/80  HENT:  Head: Normocephalic.  Right Ear: External ear normal.  Left Ear: External ear normal.  Eyes: Conjunctivae and EOM are normal.  Neck: Normal range of motion.  Cardiovascular: Normal rate and normal heart sounds.  Pulmonary/Chest: Effort normal and breath sounds normal. No stridor. No respiratory distress. He has no wheezes. He has no rales.  Abdominal: Bowel sounds are normal.  Musculoskeletal: Normal range of motion. He exhibits no edema or tenderness.  Neurological: He is alert and oriented to person, place, and time.  Psychiatric: He has a normal mood and affect. His behavior is normal.          Assessment & Plan:   Dyspnea.  This has been a problem since starting metoprolol possibly a factor.  Will discontinue and substitute amlodipine 5 mg.  Metoprolol will be tapered and discontinued over 2 weeks Status post aortobiiliac stent grafting Mild chronic kidney disease History of hypokalemia History mild postop anemia Will check updated lab  Total smoking cessation encouraged Follow-up 2 to 3 months Continue home blood pressure monitoring  [id number],PETER Pilar Plate

## 2017-08-16 NOTE — Patient Instructions (Addendum)
Limit your sodium (Salt) intake    It is important that you exercise regularly, at least 20 minutes 3 to 4 times per week.  If you develop chest pain or shortness of breath seek  medical attention.  Please check your blood pressure on a regular basis.  If it is consistently greater than 150/90, please make an office appointment.  Return in 3 months for follow-up  Decrease metoprolol to 1 tablet daily for 2 weeks, then 1 tablet every other day for 2 weeks and then discontinue  Start amlodipine 5 mg once daily

## 2017-08-20 ENCOUNTER — Other Ambulatory Visit: Payer: Self-pay | Admitting: Internal Medicine

## 2017-08-22 ENCOUNTER — Ambulatory Visit
Admission: RE | Admit: 2017-08-22 | Discharge: 2017-08-22 | Disposition: A | Discharge status: Home / Self Care | Payer: 59 | Source: Ambulatory Visit | Attending: Vascular Surgery | Admitting: Vascular Surgery

## 2017-08-22 ENCOUNTER — Other Ambulatory Visit: Payer: Self-pay

## 2017-08-22 ENCOUNTER — Ambulatory Visit (INDEPENDENT_AMBULATORY_CARE_PROVIDER_SITE_OTHER): Payer: Self-pay | Admitting: Vascular Surgery

## 2017-08-22 ENCOUNTER — Encounter: Payer: Self-pay | Admitting: Vascular Surgery

## 2017-08-22 VITALS — BP 163/95 | HR 89 | Temp 97.2°F | Resp 18 | Ht 73.0 in | Wt 211.0 lb

## 2017-08-22 DIAGNOSIS — I714 Abdominal aortic aneurysm, without rupture, unspecified: Secondary | ICD-10-CM

## 2017-08-22 DIAGNOSIS — Z48812 Encounter for surgical aftercare following surgery on the circulatory system: Secondary | ICD-10-CM

## 2017-08-22 MED ORDER — IOPAMIDOL (ISOVUE-370) INJECTION 76%
65.0000 mL | Freq: Once | INTRAVENOUS | Status: AC | PRN
  Administered 2017-08-22: 65 mL via INTRAVENOUS

## 2017-08-22 NOTE — Progress Notes (Signed)
   Patient name: [name] MRN: [id number] DOB: 26-Oct-1955 Sex: male  REASON FOR VISIT: Follow-up stent graft repair abdominal aortic aneurysm  HPI: [name] is a 62 y.o. male follow-up.  He underwent stent graft repair of infrarenal abdominal aortic aneurysm on 07/17/2017.  On postoperative day #1.  He reports that he has had progressive shortness of breath.  This is felt to be potentially related to beta-blocker.  This is being managed by his medical doctor.  He does not have any evidence of lower extremity ischemia.  Current Outpatient Medications  Medication Sig Dispense Refill  . ALPRAZolam (XANAX) 0.5 MG tablet TAKE 1 TABLET BY MOUTH TWICE A DAY AS NEEDED 180 tablet 0  . amLODipine (NORVASC) 5 MG tablet Take 1 tablet (5 mg total) by mouth daily. 90 tablet 3  . aspirin 81 MG tablet Take 81 mg by mouth daily.      . benazepril (LOTENSIN) 40 MG tablet TAKE 1 TABLET BY MOUTH EVERY DAY 90 tablet 1  . hydrochlorothiazide (HYDRODIURIL) 25 MG tablet TAKE 1 TABLET (25 MG TOTAL) BY MOUTH DAILY. 90 tablet 0  . metoprolol tartrate (LOPRESSOR) 25 MG tablet Take 25 mg by mouth daily.    . Multiple Vitamin (MULTIVITAMIN) tablet Take 1 tablet by mouth daily.      Marland Kitchen omeprazole (PRILOSEC) 20 MG capsule TAKE 1 CAPSULE BY MOUTH EVERY DAY 90 capsule 1  . pravastatin (PRAVACHOL) 40 MG tablet TAKE 2 TABLETS BY MOUTH EVERY DAY 180 tablet 1  . tamsulosin (FLOMAX) 0.4 MG CAPS capsule TAKE ONE CAPSULE BY MOUTH EVERY DAY 90 capsule 1  . traMADol (ULTRAM) 50 MG tablet Take 1 tablet (50 mg total) by mouth every 6 (six) hours as needed. 60 tablet 0   No current facility-administered medications for this visit.      PHYSICAL EXAM: Vitals:   08/22/17 1329 08/22/17 1336  BP: (!) 172/93 (!) 163/95  Pulse: 89 89  Resp: 18   Temp: (!) 97.2 F (36.2 C)   TempSrc: Oral   SpO2: 97%   Weight: 211 lb (95.7 kg)   Height: 6\' 1"  (1.854 m)     GENERAL: The patient is a  well-nourished male, in no acute distress. The vital signs are documented above. Bilateral groin puncture sites healing without evidence of false aneurysm.  Palpable distal pulses.  CT angiogram from today was reviewed with the patient and his wife present.  This shows excellent positioning of his aortobiiliac device with no evidence of endoleak.  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  Discussed this with the patient  MEDICAL ISSUES: Stable 1 month out from his stent graft repair.  Will continue his usual activities.  Will be released to return to full activity with strenuous work on 09/18/2017.  I will see him again in 6 months with ultrasound follow-up of his stent graft   [name], MD FACS [facility] Tel [phone] Pager [phone]

## 2017-08-22 NOTE — Progress Notes (Signed)
Vitals:   08/22/17 1329  BP: (!) 172/93  Pulse: 89  Resp: 18  Temp: (!) 97.2 F (36.2 C)  TempSrc: Oral  SpO2: 97%  Weight: 211 lb (95.7 kg)  Height: 6\' 1"  (1.854 m)

## 2017-08-30 ENCOUNTER — Other Ambulatory Visit: Payer: Self-pay

## 2017-08-30 DIAGNOSIS — I714 Abdominal aortic aneurysm, without rupture, unspecified: Secondary | ICD-10-CM

## 2017-11-07 ENCOUNTER — Other Ambulatory Visit: Payer: Self-pay | Admitting: Internal Medicine

## 2017-12-02 ENCOUNTER — Other Ambulatory Visit: Payer: Self-pay | Admitting: Internal Medicine

## 2017-12-15 ENCOUNTER — Other Ambulatory Visit: Payer: Self-pay | Admitting: Internal Medicine

## 2017-12-15 DIAGNOSIS — R300 Dysuria: Secondary | ICD-10-CM

## 2018-01-16 ENCOUNTER — Encounter: Payer: Self-pay | Admitting: Family Medicine

## 2018-01-16 ENCOUNTER — Ambulatory Visit (INDEPENDENT_AMBULATORY_CARE_PROVIDER_SITE_OTHER): Payer: 59 | Admitting: Family Medicine

## 2018-01-16 VITALS — BP 148/92 | HR 81 | Temp 97.6°F | Ht 73.0 in | Wt 220.2 lb

## 2018-01-16 DIAGNOSIS — N182 Chronic kidney disease, stage 2 (mild): Secondary | ICD-10-CM | POA: Diagnosis not present

## 2018-01-16 DIAGNOSIS — R252 Cramp and spasm: Secondary | ICD-10-CM

## 2018-01-16 DIAGNOSIS — I739 Peripheral vascular disease, unspecified: Secondary | ICD-10-CM | POA: Diagnosis not present

## 2018-01-16 DIAGNOSIS — F17210 Nicotine dependence, cigarettes, uncomplicated: Secondary | ICD-10-CM

## 2018-01-16 DIAGNOSIS — Z0000 Encounter for general adult medical examination without abnormal findings: Secondary | ICD-10-CM | POA: Insufficient documentation

## 2018-01-16 DIAGNOSIS — E782 Mixed hyperlipidemia: Secondary | ICD-10-CM

## 2018-01-16 DIAGNOSIS — R972 Elevated prostate specific antigen [PSA]: Secondary | ICD-10-CM

## 2018-01-16 DIAGNOSIS — N183 Chronic kidney disease, stage 3 unspecified: Secondary | ICD-10-CM

## 2018-01-16 DIAGNOSIS — I10 Essential (primary) hypertension: Secondary | ICD-10-CM | POA: Diagnosis not present

## 2018-01-16 DIAGNOSIS — Z1211 Encounter for screening for malignant neoplasm of colon: Secondary | ICD-10-CM | POA: Insufficient documentation

## 2018-01-16 LAB — CBC
HCT: 45 % (ref 39.0–52.0)
Hemoglobin: 15.6 g/dL (ref 13.0–17.0)
MCHC: 34.6 g/dL (ref 30.0–36.0)
MCV: 94.6 fl (ref 78.0–100.0)
Platelets: 215 10*3/uL (ref 150.0–400.0)
RBC: 4.76 Mil/uL (ref 4.22–5.81)
RDW: 13.4 % (ref 11.5–15.5)
WBC: 6.9 10*3/uL (ref 4.0–10.5)

## 2018-01-16 LAB — COMPREHENSIVE METABOLIC PANEL
ALK PHOS: 40 U/L (ref 39–117)
ALT: 21 U/L (ref 0–53)
AST: 21 U/L (ref 0–37)
Albumin: 4.8 g/dL (ref 3.5–5.2)
BILIRUBIN TOTAL: 0.5 mg/dL (ref 0.2–1.2)
BUN: 17 mg/dL (ref 6–23)
CO2: 27 meq/L (ref 19–32)
Calcium: 10.1 mg/dL (ref 8.4–10.5)
Chloride: 94 mEq/L — ABNORMAL LOW (ref 96–112)
Creatinine, Ser: 1.67 mg/dL — ABNORMAL HIGH (ref 0.40–1.50)
GFR: 44.44 mL/min — ABNORMAL LOW (ref >60.00)
GLUCOSE: 94 mg/dL (ref 70–99)
POTASSIUM: 4.8 meq/L (ref 3.5–5.1)
SODIUM: 129 meq/L — AB (ref 135–145)
TOTAL PROTEIN: 7.6 g/dL (ref 6.0–8.3)

## 2018-01-16 LAB — LIPID PANEL
Cholesterol: 180 mg/dL (ref 0–200)
HDL: 28.9 mg/dL — AB (ref >39.00)
Total CHOL/HDL Ratio: 6
Triglycerides: 512 mg/dL — ABNORMAL HIGH (ref 0.0–149.0)

## 2018-01-16 LAB — PSA: PSA: 4.4 ng/mL — AB (ref 0.10–4.00)

## 2018-01-16 LAB — TSH: TSH: 1.32 u[IU]/mL (ref 0.35–4.50)

## 2018-01-16 LAB — LDL CHOLESTEROL, DIRECT: Direct LDL: 75 mg/dL

## 2018-01-16 MED ORDER — BENAZEPRIL HCL 40 MG PO TABS: 40.0000 mg | ORAL_TABLET | Freq: Every day | ORAL | 1 refills | Status: DC

## 2018-01-16 MED ORDER — HYDROCHLOROTHIAZIDE 25 MG PO TABS: 25.0000 mg | ORAL_TABLET | Freq: Every day | ORAL | 0 refills | Status: DC

## 2018-01-16 MED ORDER — ASPIRIN 81 MG PO TABS: 81.0000 mg | ORAL_TABLET | Freq: Every day | ORAL | 0 refills | Status: DC

## 2018-01-16 MED ORDER — AMLODIPINE BESYLATE 10 MG PO TABS: 10.0000 mg | ORAL_TABLET | Freq: Every day | ORAL | 1 refills | Status: DC

## 2018-01-16 NOTE — Progress Notes (Addendum)
Subjective:  Patient ID: [name], male    DOB: Sep 24, 1955  Age: 62 y.o. MRN: [id number]  CC: Establish Care   HPI [name] presents for establishment of care via transfer of care.  Significant past medical history of hypertension with dyslipidemia, peripheral vascular disease and tobacco abuse.  He frankly is not interested in stopping smoking at this time.  He recently underwent a AAA endovascular stent grafting that included his iliac arteries.  His blood pressure has been running in the 150s he tells me.  He is currently taking Norvasc 5 mg Lotensin 40 mg and HCTZ 25 mg.  He had developed shortness of breath taking metoprolol and this medication was discontinued.  He continues to experience claudication type pains in his calves when he walks.  Pain is relieved with rest.  He has taken Xanax mostly at nighttime status post his mother's death some 11 years ago.  He does not drink alcohol or use illicit drugs.  He lives with his wife.  They have 2 children and 5 granddaughters.  He works at [employer].  He has never had a colonoscopy.  Patient complains of nighttime muscle cramps.  Outpatient Medications Prior to Visit  Medication Sig Dispense Refill  . ALPRAZolam (XANAX) 0.5 MG tablet TAKE 1 TABLET BY MOUTH TWICE A DAY AS NEEDED 180 tablet 0  . Multiple Vitamin (MULTIVITAMIN) tablet Take 1 tablet by mouth daily.      Marland Kitchen omeprazole (PRILOSEC) 20 MG capsule TAKE 1 CAPSULE BY MOUTH EVERY DAY 90 capsule 1  . pravastatin (PRAVACHOL) 40 MG tablet TAKE 2 TABLETS BY MOUTH EVERY DAY 180 tablet 1  . tamsulosin (FLOMAX) 0.4 MG CAPS capsule TAKE ONE CAPSULE BY MOUTH EVERY DAY 90 capsule 1  . traMADol (ULTRAM) 50 MG tablet Take 1 tablet (50 mg total) by mouth every 6 (six) hours as needed. 60 tablet 0  . amLODipine (NORVASC) 5 MG tablet Take 1 tablet (5 mg total) by mouth daily. 90 tablet 3  . aspirin 81 MG tablet Take 81 mg by mouth daily.      . benazepril (LOTENSIN) 40 MG tablet TAKE 1  TABLET BY MOUTH EVERY DAY 90 tablet 1  . hydrochlorothiazide (HYDRODIURIL) 25 MG tablet TAKE 1 TABLET (25 MG TOTAL) BY MOUTH DAILY. 90 tablet 0  . metoprolol tartrate (LOPRESSOR) 25 MG tablet Take 25 mg by mouth daily.     No facility-administered medications prior to visit.     ROS Review of Systems  Constitutional: Negative.   HENT: Negative.   Eyes: Negative.   Respiratory: Negative.   Cardiovascular: Negative.   Gastrointestinal: Negative.   Endocrine: Negative for polyphagia and polyuria.  Genitourinary: Negative.   Musculoskeletal: Positive for myalgias. Negative for joint swelling.  Skin: Negative for pallor and rash.  Allergic/Immunologic: Negative for immunocompromised state.  Neurological: Negative for speech difficulty and light-headedness.  Hematological: Does not bruise/bleed easily.  Psychiatric/Behavioral: The patient is nervous/anxious.     Objective:  BP (!) 148/92 (BP Location: Left Arm, Patient Position: Sitting, Cuff Size: Normal)   Pulse 81   Temp 97.6 F (36.4 C) (Oral)   Ht 6\' 1"  (1.854 m)   Wt 220 lb 4 oz (99.9 kg)   SpO2 97%   BMI 29.06 kg/m   BP Readings from Last 3 Encounters:  01/16/18 (!) 148/92  08/22/17 (!) 163/95  08/16/17 120/68    Wt Readings from Last 3 Encounters:  01/16/18 220 lb 4 oz (99.9 kg)  08/22/17 211 lb (95.7 kg)  08/16/17 211 lb (95.7 kg)    Physical Exam  Constitutional: He is oriented to person, place, and time. He appears well-developed and well-nourished. No distress.  HENT:  Head: Normocephalic and atraumatic.  Right Ear: External ear normal.  Left Ear: External ear normal.  Mouth/Throat: Oropharynx is clear and moist. No oropharyngeal exudate.  Eyes: Pupils are equal, round, and reactive to light. Conjunctivae and EOM are normal. Right eye exhibits no discharge. Left eye exhibits no discharge. No scleral icterus.  Neck: Neck supple. No JVD present. No tracheal deviation present. No thyromegaly present.    Cardiovascular: Normal rate, regular rhythm and normal heart sounds.  Pulmonary/Chest: Effort normal. He has decreased breath sounds.  Abdominal: Bowel sounds are normal.  Neurological: He is alert and oriented to person, place, and time.  Skin: Skin is warm and dry. He is not diaphoretic.  Psychiatric: He has a normal mood and affect. His behavior is normal.    Lab Results  Component Value Date   WBC 6.9 01/16/2018   HGB 15.6 01/16/2018   HCT 45.0 01/16/2018   PLT 215.0 01/16/2018   GLUCOSE 94 01/16/2018   CHOL 180 01/16/2018   TRIG (H) 01/16/2018    512.0 Triglyceride is over 400; calculations on Lipids are invalid.   HDL 28.90 (L) 01/16/2018   LDLDIRECT 75.0 01/16/2018   ALT 21 01/16/2018   AST 21 01/16/2018   NA 129 (L) 01/16/2018   K 4.8 01/16/2018   CL 94 (L) 01/16/2018   CREATININE 1.67 (H) 01/16/2018   BUN 17 01/16/2018   CO2 27 01/16/2018   TSH 1.32 01/16/2018   PSA 4.40 (H) 01/16/2018   INR 1.02 07/04/2017   HGBA1C 5.6 06/07/2006    Ct Angio Abdomen Pelvis  W &/or Wo Contrast  Result Date: 08/22/2017 CLINICAL DATA:  Status post EVAR to treat abdominal aortic aneurysm on 07/17/2017. EXAM: CT ANGIOGRAPHY ABDOMEN AND PELVIS WITH CONTRAST AND WITHOUT CONTRAST TECHNIQUE: Multidetector CT imaging of the abdomen and pelvis was performed using the standard protocol during bolus administration of intravenous contrast. Multiplanar reconstructed images and MIPs were obtained and reviewed to evaluate the vascular anatomy. CONTRAST:  62mL ISOVUE-370 IOPAMIDOL (ISOVUE-370) INJECTION 76% COMPARISON:  05/30/2017 FINDINGS: VASCULAR Aorta: The aortic endograft is well positioned from the level of the infrarenal aorta into bilateral common iliac arteries. There is no evidence of endoleak. Maximal aneurysm sac size is approximately 5.3 x 5.6 cm. At a similar level, reviewed measurements of the aneurysm are 5.5 x 5.6 cm on the prior study. Celiac: Normally patent. Normally patent branch  vessels and branching anatomy. SMA: Normally patent. Renals: Bilateral dominant renal arteries remain patent with probable focal roughly 50% stenosis at the origin of the dominant left renal artery. 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 There is some collateral reconstitution of this vessel just beyond the aorta. A tiny lower pole accessory right renal artery remains opacified without clear visualization of its origin. This is also likely open due to collateral flow. IMA: Occluded origin with distal branch vessel collateral reconstitution. Inflow: Distal endograft limbs flare open into dilated bilateral common iliac arteries. There is good apposition of the endograft limbs at the level of the distal common iliac arteries. Native internal and external iliac arteries show stable and normal patency. Proximal Outflow: Common femoral arteries and femoral bifurcations show no significant disease. Review of the MIP images confirms the above findings. NON-VASCULAR Lower chest: No acute abnormality. Hepatobiliary:  Stable steatosis and potential early cirrhotic morphology. Stable calcified gallstones. No biliary ductal dilatation. Pancreas: Unremarkable. No pancreatic ductal dilatation or surrounding inflammatory changes. Spleen: Normal in size without focal abnormality. 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 This is a fairly small region of infarction affecting approximately 15-20% of the renal parenchyma. No associated evidence to suggest abscess. No evidence of right-sided renal infarct. The adrenal glands are unremarkable. Stomach/Bowel: Bowel shows no evidence of obstruction or inflammation. No free air. No abnormal fluid collections. Lymphatic: No enlarged lymph nodes identified. Reproductive: Stable mild prostatic enlargement. Other: No abdominal wall hernia or abnormality. No abdominopelvic  ascites. Musculoskeletal: No acute or significant osseous findings. IMPRESSION: Post EVAR, the aneurysm sac size shows stable to slightly smaller dimensions. There is no evidence of endoleak. The endograft is well positioned and normally patent. 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 This small infarct involves approximately 15-20% of the left renal parenchyma. Electronically Signed   By: Aletta Edouard M.D.   On: 08/22/2017 13:25    Assessment & Plan:   Rikki was seen today for establish care.  Diagnoses and all orders for this visit:  Essential hypertension -     CBC -     Comprehensive metabolic panel -     Urinalysis, Routine w reflex microscopic -     amLODipine (NORVASC) 10 MG tablet; Take 1 tablet (10 mg total) by mouth daily. -     benazepril (LOTENSIN) 40 MG tablet; Take 1 tablet (40 mg total) by mouth daily. -     hydrochlorothiazide (HYDRODIURIL) 25 MG tablet; Take 1 tablet (25 mg total) by mouth daily.  Peripheral vascular disease, unspecified ([place]) -     LDL cholesterol, direct -     Lipid panel -     TSH -     amLODipine (NORVASC) 10 MG tablet; Take 1 tablet (10 mg total) by mouth daily. -     aspirin 81 MG tablet; Take 1 tablet (81 mg total) by mouth daily.  Mixed dyslipidemia -     Comprehensive metabolic panel -     LDL cholesterol, direct -     Lipid panel -     TSH -     aspirin 81 MG tablet; Take 1 tablet (81 mg total) by mouth daily.  Cigarette smoker  Healthcare maintenance -     PSA  Screen for colon cancer -     Ambulatory referral to Gastroenterology  Muscle cramps -     Magnesium  Elevated PSA, less than 10 ng/ml  CKD (chronic kidney disease) stage 3, GFR 30-59 ml/min ([facility]) -     Comprehensive metabolic panel -     benazepril (LOTENSIN) 40 MG tablet; Take 1 tablet (40 mg total) by mouth daily.   I have discontinued Axton A. Alling's  amLODipine and metoprolol tartrate. I have also changed his aspirin and benazepril. Additionally, I am having him start on amLODipine. Lastly, I am having him maintain his multivitamin, omeprazole, traMADol, pravastatin, ALPRAZolam, tamsulosin, and hydrochlorothiazide.   The 10-year ASCVD risk score Mikey Bussing DC Brooke Bonito., et al., 2013) is: 28.3%   Values used to calculate the score:     Age: 89 years     Sex: Male     Is Non-Hispanic African American: No  Diabetic: No     Tobacco smoker: Yes     Systolic Blood Pressure: 443 mmHg     Is BP treated: Yes     HDL Cholesterol: 28.9 mg/dL     Total Cholesterol: 180 mg/dL  Meds ordered this encounter  Medications  . amLODipine (NORVASC) 10 MG tablet    Sig: Take 1 tablet (10 mg total) by mouth daily.    Dispense:  90 tablet    Refill:  1  . aspirin 81 MG tablet    Sig: Take 1 tablet (81 mg total) by mouth daily.    Dispense:  365 tablet    Refill:  0  . benazepril (LOTENSIN) 40 MG tablet    Sig: Take 1 tablet (40 mg total) by mouth daily.    Dispense:  90 tablet    Refill:  1  . hydrochlorothiazide (HYDRODIURIL) 25 MG tablet    Sig: Take 1 tablet (25 mg total) by mouth daily.    Dispense:  90 tablet    Refill:  0   Patient is fasting today and we drew fasting labs.  He was given anticipatory guidance on health maintenance and disease prevention.  We discussed quitting smoking.  He was given information on coping with quitting tobacco.  Urged him to take a flu shot today and he declined.  Went ahead and referred him for screening colonoscopy.  Increase the amlodipine to 10 mg to help with his peripheral vascular disease and lower his blood pressure.  At some point we will switch him from HCTZ to chlorthalidone.  Patient will check and record his blood pressures and follow-up in 3 months.  Follow-up: Return in about 3 months (around 04/18/2018).  Libby Maw, MD

## 2018-01-16 NOTE — Patient Instructions (Signed)
Coping with Quitting Smoking Quitting smoking is a physical and mental challenge. You will face cravings, withdrawal symptoms, and temptation. Before quitting, work with your health care provider to make a plan that can help you cope. Preparation can help you quit and keep you from giving in. How can I cope with cravings? Cravings usually last for 5-10 minutes. If you get through it, the craving will pass. Consider taking the following actions to help you cope with cravings:  Keep your mouth busy: ? Chew sugar-free gum. ? Suck on hard candies or a straw. ? Brush your teeth.  Keep your hands and body busy: ? Immediately change to a different activity when you feel a craving. ? Squeeze or play with a ball. ? Do an activity or a hobby, like making bead jewelry, practicing needlepoint, or working with wood. ? Mix up your normal routine. ? Take a short exercise break. Go for a quick walk or run up and down stairs. ? Spend time in public places where smoking is not allowed.  Focus on doing something kind or helpful for someone else.  Call a friend or family member to talk during a craving.  Join a support group.  Call a quit line, such as 1-800-QUIT-NOW.  Talk with your health care provider about medicines that might help you cope with cravings and make quitting easier for you.  How can I deal with withdrawal symptoms? Your body may experience negative effects as it tries to get used to not having nicotine in the system. These effects are called withdrawal symptoms. They may include:  Feeling hungrier than normal.  Trouble concentrating.  Irritability.  Trouble sleeping.  Feeling depressed.  Restlessness and agitation.  Craving a cigarette.  To manage withdrawal symptoms:  Avoid places, people, and activities that trigger your cravings.  Remember why you want to quit.  Get plenty of sleep.  Avoid coffee and other caffeinated drinks. These may worsen some of your  symptoms.  How can I handle social situations? Social situations can be difficult when you are quitting smoking, especially in the first few weeks. To manage this, you can:  Avoid parties, bars, and other social situations where people might be smoking.  Avoid alcohol.  Leave right away if you have the urge to smoke.  Explain to your family and friends that you are quitting smoking. Ask for understanding and support.  Plan activities with friends or family where smoking is not an option.  What are some ways I can cope with stress? Wanting to smoke may cause stress, and stress can make you want to smoke. Find ways to manage your stress. Relaxation techniques can help. For example:  Breathe slowly and deeply, in through your nose and out through your mouth.  Listen to soothing, relaxing music.  Talk with a family member or friend about your stress.  Light a candle.  Soak in a bath or take a shower.  Think about a peaceful place.  What are some ways I can prevent weight gain? Be aware that many people gain weight after they quit smoking. However, not everyone does. To keep from gaining weight, have a plan in place before you quit and stick to the plan after you quit. Your plan should include:  Having healthy snacks. When you have a craving, it may help to: ? Eat plain popcorn, crunchy carrots, celery, or other cut vegetables. ? Chew sugar-free gum.  Changing how you eat: ? Eat small portion sizes at meals. ?   Eat 4-6 small meals throughout the day instead of 1-2 large meals a day. ? Be mindful when you eat. Do not watch television or do other things that might distract you as you eat.  Exercising regularly: ? Make time to exercise each day. If you do not have time for a long workout, do short bouts of exercise for 5-10 minutes several times a day. ? Do some form of strengthening exercise, like weight lifting, and some form of aerobic exercise, like running or  swimming.  Drinking plenty of water or other low-calorie or no-calorie drinks. Drink 6-8 glasses of water daily, or as much as instructed by your health care provider.  Summary  Quitting smoking is a physical and mental challenge. You will face cravings, withdrawal symptoms, and temptation to smoke again. Preparation can help you as you go through these challenges.  You can cope with cravings by keeping your mouth busy (such as by chewing gum), keeping your body and hands busy, and making calls to family, friends, or a helpline for people who want to quit smoking.  You can cope with withdrawal symptoms by avoiding places where people smoke, avoiding drinks with caffeine, and getting plenty of rest.  Ask your health care provider about the different ways to prevent weight gain, avoid stress, and handle social situations. This information is not intended to replace advice given to you by your health care provider. Make sure you discuss any questions you have with your health care provider. Document Released: 02/26/2016 Document Revised: 02/26/2016 Document Reviewed: 02/26/2016 Elsevier Interactive Patient Education  2018 [place] Eating Plan DASH stands for "Dietary Approaches to Stop Hypertension." The DASH eating plan is a healthy eating plan that has been shown to reduce high blood pressure (hypertension). It may also reduce your risk for type 2 diabetes, heart disease, and stroke. The DASH eating plan may also help with weight loss. What are tips for following this plan? General guidelines  Avoid eating more than 2,300 mg (milligrams) of salt (sodium) a day. If you have hypertension, you may need to reduce your sodium intake to 1,500 mg a day.  Limit alcohol intake to no more than 1 drink a day for nonpregnant women and 2 drinks a day for men. One drink equals 12 oz of beer, 5 oz of wine, or 1 oz of hard liquor.  Work with your health care provider to maintain a healthy body  weight or to lose weight. Ask what an ideal weight is for you.  Get at least 30 minutes of exercise that causes your heart to beat faster (aerobic exercise) most days of the week. Activities may include walking, swimming, or biking.  Work with your health care provider or diet and nutrition specialist (dietitian) to adjust your eating plan to your individual calorie needs. Reading food labels  Check food labels for the amount of sodium per serving. Choose foods with less than 5 percent of the Daily Value of sodium. Generally, foods with less than 300 mg of sodium per serving fit into this eating plan.  To find whole grains, look for the word "whole" as the first word in the ingredient list. Shopping  Buy products labeled as "low-sodium" or "no salt added."  Buy fresh foods. Avoid canned foods and premade or frozen meals. Cooking  Avoid adding salt when cooking. Use salt-free seasonings or herbs instead of table salt or sea salt. Check with your health care provider or pharmacist before using salt substitutes.  Do not fry foods. Cook foods using healthy methods such as baking, boiling, grilling, and broiling instead.  Cook with heart-healthy oils, such as olive, canola, soybean, or sunflower oil. Meal planning   Eat a balanced diet that includes: ? 5 or more servings of fruits and vegetables each day. At each meal, try to fill half of your plate with fruits and vegetables. ? Up to 6-8 servings of whole grains each day. ? Less than 6 oz of lean meat, poultry, or fish each day. A 3-oz serving of meat is about the same size as a deck of cards. One egg equals 1 oz. ? 2 servings of low-fat dairy each day. ? A serving of nuts, seeds, or beans 5 times each week. ? Heart-healthy fats. Healthy fats called Omega-3 fatty acids are found in foods such as flaxseeds and coldwater fish, like sardines, salmon, and mackerel.  Limit how much you eat of the following: ? Canned or prepackaged  foods. ? Food that is high in trans fat, such as fried foods. ? Food that is high in saturated fat, such as fatty meat. ? Sweets, desserts, sugary drinks, and other foods with added sugar. ? Full-fat dairy products.  Do not salt foods before eating.  Try to eat at least 2 vegetarian meals each week.  Eat more home-cooked food and less restaurant, buffet, and fast food.  When eating at a restaurant, ask that your food be prepared with less salt or no salt, if possible. What foods are recommended? The items listed may not be a complete list. Talk with your dietitian about what dietary choices are best for you. Grains Whole-grain or whole-wheat bread. Whole-grain or whole-wheat pasta. Brown rice. [place]. Bulgur. Whole-grain and low-sodium cereals. Pita bread. Low-fat, low-sodium crackers. Whole-wheat flour tortillas. Vegetables Fresh or frozen vegetables (raw, steamed, roasted, or grilled). Low-sodium or reduced-sodium tomato and vegetable juice. Low-sodium or reduced-sodium tomato sauce and tomato paste. Low-sodium or reduced-sodium canned vegetables. Fruits All fresh, dried, or frozen fruit. Canned fruit in natural juice (without added sugar). Meat and other protein foods Skinless chicken or [place]. Ground chicken or [place]. Pork with fat trimmed off. Fish and seafood. Egg whites. Dried beans, peas, or lentils. Unsalted nuts, nut butters, and seeds. Unsalted canned beans. Lean cuts of beef with fat trimmed off. Low-sodium, lean deli meat. Dairy Low-fat (1%) or fat-free (skim) milk. Fat-free, low-fat, or reduced-fat cheeses. Nonfat, low-sodium ricotta or cottage cheese. Low-fat or nonfat yogurt. Low-fat, low-sodium cheese. Fats and oils Soft margarine without trans fats. Vegetable oil. Low-fat, reduced-fat, or light mayonnaise and salad dressings (reduced-sodium). Canola, safflower, olive, soybean, and sunflower oils. Avocado. Seasoning and other foods Herbs. Spices. Seasoning  mixes without salt. Unsalted popcorn and pretzels. Fat-free sweets. What foods are not recommended? The items listed may not be a complete list. Talk with your dietitian about what dietary choices are best for you. Grains Baked goods made with fat, such as croissants, muffins, or some breads. Dry pasta or rice meal packs. Vegetables Creamed or fried vegetables. Vegetables in a cheese sauce. Regular canned vegetables (not low-sodium or reduced-sodium). Regular canned tomato sauce and paste (not low-sodium or reduced-sodium). Regular tomato and vegetable juice (not low-sodium or reduced-sodium). Angie Fava. Olives. Fruits Canned fruit in a light or heavy syrup. Fried fruit. Fruit in cream or butter sauce. Meat and other protein foods Fatty cuts of meat. Ribs. Fried meat. Berniece Salines. Sausage. Bologna and other processed lunch meats. Salami. Fatback. Hotdogs. Bratwurst. Salted nuts and seeds. Canned beans  with added salt. Canned or smoked fish. Whole eggs or egg yolks. Chicken or [place] with skin. Dairy Whole or 2% milk, cream, and half-and-half. Whole or full-fat cream cheese. Whole-fat or sweetened yogurt. Full-fat cheese. Nondairy creamers. Whipped toppings. Processed cheese and cheese spreads. Fats and oils Butter. Stick margarine. Lard. Shortening. Ghee. Bacon fat. Tropical oils, such as coconut, palm kernel, or palm oil. Seasoning and other foods Salted popcorn and pretzels. Onion salt, garlic salt, seasoned salt, table salt, and sea salt. Worcestershire sauce. Tartar sauce. Barbecue sauce. Teriyaki sauce. Soy sauce, including reduced-sodium. Steak sauce. Canned and packaged gravies. Fish sauce. Oyster sauce. Cocktail sauce. Horseradish that you find on the shelf. Ketchup. Mustard. Meat flavorings and tenderizers. Bouillon cubes. Hot sauce and Tabasco sauce. Premade or packaged marinades. Premade or packaged taco seasonings. Relishes. Regular salad dressings. Where to find more information:  National  Heart, Lung, and [place]: https://wilson-eaton.com/  American Heart Association: www.heart.org Summary  The DASH eating plan is a healthy eating plan that has been shown to reduce high blood pressure (hypertension). It may also reduce your risk for type 2 diabetes, heart disease, and stroke.  With the DASH eating plan, you should limit salt (sodium) intake to 2,300 mg a day. If you have hypertension, you may need to reduce your sodium intake to 1,500 mg a day.  When on the DASH eating plan, aim to eat more fresh fruits and vegetables, whole grains, lean proteins, low-fat dairy, and heart-healthy fats.  Work with your health care provider or diet and nutrition specialist (dietitian) to adjust your eating plan to your individual calorie needs. This information is not intended to replace advice given to you by your health care provider. Make sure you discuss any questions you have with your health care provider. Document Released: 02/17/2011 Document Revised: 02/22/2016 Document Reviewed: 02/22/2016 Elsevier Interactive Patient Education  2018 [employer].  Dyslipidemia Dyslipidemia is an imbalance of waxy, fat-like substances (lipids) in the blood. The body needs lipids in small amounts. Dyslipidemia often involves a high level of cholesterol or triglycerides, which are types of lipids. Common forms of dyslipidemia include:  High levels of bad cholesterol (LDL cholesterol). LDL is the type of cholesterol that causes fatty deposits (plaques) to build up in the blood vessels that carry blood away from your heart (arteries).  Low levels of good cholesterol (HDL cholesterol). HDL cholesterol is the type of cholesterol that protects against heart disease. High levels of HDL remove the LDL buildup from arteries.  High levels of triglycerides. Triglycerides are a fatty substance in the blood that is linked to a buildup of plaques in the arteries.  You can develop dyslipidemia because of the genes  you are born with (primary dyslipidemia) or changes that occur during your life (secondary dyslipidemia), or as a side effect of certain medical treatments. What are the causes? Primary dyslipidemia is caused by changes (mutations) in genes that are passed down through families (inherited). These mutations cause several types of dyslipidemia. Mutations can result in disorders that make the body produce too much LDL cholesterol or triglycerides, or not enough HDL cholesterol. These disorders may lead to heart disease, arterial disease, or stroke at an early age. Causes of secondary dyslipidemia include certain lifestyle choices and diseases that lead to dyslipidemia, such as:  Eating a diet that is high in animal fat.  Not getting enough activity or exercise (having a sedentary lifestyle).  Having diabetes, kidney disease, liver disease, or thyroid disease.  Drinking large amounts  of alcohol.  Using certain types of drugs.  What increases the risk? You may be at greater risk for dyslipidemia if you are an older man or if you are a woman who has gone through menopause. Other risk factors include:  Having a family history of dyslipidemia.  Taking certain medicines, including birth control pills, steroids, some diuretics, beta-blockers, and some medicines forHIV.  Smoking cigarettes.  Eating a high-fat diet.  Drinking large amounts of alcohol.  Having certain medical conditions such as diabetes, polycystic ovary syndrome (PCOS), pregnancy, kidney disease, liver disease, or hypothyroidism.  Not exercising regularly.  Being overweight or obese with too much belly fat.  What are the signs or symptoms? Dyslipidemia does not usually cause any symptoms. Very high lipid levels can cause fatty bumps under the skin (xanthomas) or a white or gray ring around the black center (pupil) of the eye. Very high triglyceride levels can cause inflammation of the pancreas (pancreatitis). How is this  diagnosed? Your health care provider may diagnose dyslipidemia based on a routine blood test (fasting blood test). Because most people do not have symptoms of the condition, this blood testing (lipid profile) is done on adults age 50 and older and is repeated every 5 years. This test checks:  Total cholesterol. This is a measure of the total amount of cholesterol in your blood, including LDL cholesterol, HDL cholesterol, and triglycerides. A healthy number is below 200.  LDL cholesterol. The target number for LDL cholesterol is different for each person, depending on individual risk factors. For most people, a number below 100 is healthy. Ask your health care provider what your LDL cholesterol number should be.  HDL cholesterol. An HDL level of 60 or higher is best because it helps to protect against heart disease. A number below 75 for men or below 1 for women increases the risk for heart disease.  Triglycerides. A healthy triglyceride number is below 150.  If your lipid profile is abnormal, your health care provider may do other blood tests to get more information about your condition. How is this treated? Treatment depends on the type of dyslipidemia that you have and your other risk factors for heart disease and stroke. Your health care provider will have a target range for your lipid levels based on this information. For many people, treatment starts with lifestyle changes, such as diet and exercise. Your health care provider may recommend that you:  Get regular exercise.  Make changes to your diet.  Quit smoking if you smoke.  If diet changes and exercise do not help you reach your goals, your health care provider may also prescribe medicine to lower lipids. The most commonly prescribed type of medicine lowers your LDL cholesterol (statin drug). If you have a high triglyceride level, your provider may prescribe another type of drug (fibrate) or an omega-3 fish oil supplement, or  both. Follow these instructions at home:  Take over-the-counter and prescription medicines only as told by your health care provider. This includes supplements.  Get regular exercise. Start an aerobic exercise and strength training program as told by your health care provider. Ask your health care provider what activities are safe for you. Your health care provider may recommend: ? 30 minutes of aerobic activity 4-6 days a week. Brisk walking is an example of aerobic activity. ? Strength training 2 days a week.  Eat a healthy diet as told by your health care provider. This can help you reach and maintain a healthy weight, lower  your LDL cholesterol, and raise your HDL cholesterol. It may help to work with a diet and nutrition specialist (dietitian) to make a plan that is right for you. Your dietitian or health care provider may recommend: ? Limiting your calories, if you are overweight. ? Eating more fruits, vegetables, whole grains, fish, and lean meats. ? Limiting saturated fat, trans fat, and cholesterol.  Follow instructions from your health care provider or dietitian about eating or drinking restrictions.  Limit alcohol intake to no more than one drink per day for nonpregnant women and two drinks per day for men. One drink equals 12 oz of beer, 5 oz of wine, or 1 oz of hard liquor.  Do not use any products that contain nicotine or tobacco, such as cigarettes and e-cigarettes. If you need help quitting, ask your health care provider.  Keep all follow-up visits as told by your health care provider. This is important. Contact a health care provider if:  You are having trouble sticking to your exercise or diet plan.  You are struggling to quit smoking or control your use of alcohol. Summary  Dyslipidemia is an imbalance of waxy, fat-like substances (lipids) in the blood. The body needs lipids in small amounts. Dyslipidemia often involves a high level of cholesterol or triglycerides,  which are types of lipids.  Treatment depends on the type of dyslipidemia that you have and your other risk factors for heart disease and stroke.  For many people, treatment starts with lifestyle changes, such as diet and exercise. Your health care provider may also prescribe medicine to lower lipids. This information is not intended to replace advice given to you by your health care provider. Make sure you discuss any questions you have with your health care provider. Document Released: 03/05/2013 Document Revised: 10/26/2015 Document Reviewed: 10/26/2015 Elsevier Interactive Patient Education  [employer].

## 2018-01-17 DIAGNOSIS — N183 Chronic kidney disease, stage 3 unspecified: Secondary | ICD-10-CM | POA: Insufficient documentation

## 2018-01-17 DIAGNOSIS — R972 Elevated prostate specific antigen [PSA]: Secondary | ICD-10-CM | POA: Insufficient documentation

## 2018-01-17 DIAGNOSIS — R252 Cramp and spasm: Secondary | ICD-10-CM | POA: Insufficient documentation

## 2018-01-30 ENCOUNTER — Other Ambulatory Visit: Payer: Self-pay | Admitting: Internal Medicine

## 2018-02-27 ENCOUNTER — Ambulatory Visit: Payer: 59 | Admitting: Vascular Surgery

## 2018-02-27 ENCOUNTER — Other Ambulatory Visit (HOSPITAL_COMMUNITY): Payer: 59

## 2018-03-05 ENCOUNTER — Other Ambulatory Visit: Payer: Self-pay | Admitting: Internal Medicine

## 2018-03-12 ENCOUNTER — Telehealth (HOSPITAL_COMMUNITY): Payer: Self-pay | Admitting: Surgery

## 2018-03-12 NOTE — Telephone Encounter (Signed)
Attempted to contact patient to confirm appointments for 03/13/2018 [facility]

## 2018-03-13 ENCOUNTER — Encounter: Payer: Self-pay | Admitting: Vascular Surgery

## 2018-03-13 ENCOUNTER — Other Ambulatory Visit: Payer: Self-pay

## 2018-03-13 ENCOUNTER — Ambulatory Visit (INDEPENDENT_AMBULATORY_CARE_PROVIDER_SITE_OTHER): Payer: 59 | Admitting: Vascular Surgery

## 2018-03-13 ENCOUNTER — Ambulatory Visit (HOSPITAL_COMMUNITY)
Admission: RE | Admit: 2018-03-13 | Discharge: 2018-03-13 | Disposition: A | Discharge status: Home / Self Care | Payer: 59 | Source: Ambulatory Visit | Attending: Vascular Surgery | Admitting: Vascular Surgery

## 2018-03-13 VITALS — BP 132/83 | HR 80 | Temp 96.8°F | Resp 18 | Ht 72.0 in | Wt 213.0 lb

## 2018-03-13 DIAGNOSIS — I714 Abdominal aortic aneurysm, without rupture, unspecified: Secondary | ICD-10-CM

## 2018-03-13 DIAGNOSIS — Z48812 Encounter for surgical aftercare following surgery on the circulatory system: Secondary | ICD-10-CM | POA: Diagnosis not present

## 2018-03-13 NOTE — Progress Notes (Signed)
Vascular and Vein Specialist of [facility]  Patient name: [name] MRN: [id number] DOB: December 07, 1955 Sex: male  REASON FOR VISIT: Follow-up endovascular repair of abdominal aortic aneurysm.  He is also status post above-knee to below-knee right popliteal bypass.  He reports some continued claudication type symptoms in his right leg but this is unchanged.  He reports that his wife of 41 years had a recent fall and has had a very difficult time in the intensive care unit on the ventilator and has been transferred to a long-term care facility.  HPI: [name] is a 62 y.o. male here today for follow-up as above.  No symptoms referable to his aneurysm  Past Medical History:  Diagnosis Date  . AAA (abdominal aortic aneurysm) ([place])   . AAA (abdominal aortic aneurysm) without rupture ([place])   . Arthritis   . BPH (benign prostatic hyperplasia)   . Claudication [facility])    lower extremities  . Claustrophobia    panic attacks  . COLONIC POLYPS, HX OF 05/02/2007  . DVT (deep venous thrombosis) ([place])   . Dyspnea    with exertion  . GERD 11/08/2006  . HYPERCHOLESTEROLEMIA, SEVERE 05/02/2007  . HYPERTENSION 11/08/2006  . LOW BACK PAIN 11/22/2007  . 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 PONV (postoperative nausea and vomiting)   . Seizures ([place])    from concussion several months ago has been having headache  . Weak urine stream    Slow to get started    Family History  Problem Relation Age of Onset  . Adrenal disorder Brother   . Hyperlipidemia Brother   . Hypertension Brother   . Heart disease Brother        Heart Disease before age 34  . Heart attack Brother   . Heart disease Mother        Heart Disease before age 19  . Hyperlipidemia Mother   . Hypertension Mother   . Heart attack Mother   . Heart disease Father        Heart Disease before age 37  . Hyperlipidemia Father   . Heart attack Father   . Pulmonary embolism Sister      SOCIAL HISTORY: Social History   Tobacco Use  . Smoking status: Current Every Day Smoker    Packs/day: 1.00    Years: 40.00    Pack years: 40.00    Types: Cigarettes  . Smokeless tobacco: Never Used  Substance Use Topics  . Alcohol use: No    Alcohol/week: 0.0 standard drinks    Allergies  Allergen Reactions  . Cortisone   . Sulfonamide Derivatives     REACTION: rash    Current Outpatient Medications  Medication Sig Dispense Refill  . ALPRAZolam (XANAX) 0.5 MG tablet TAKE 1 TABLET BY MOUTH TWICE A DAY AS NEEDED 180 tablet 0  . amLODipine (NORVASC) 10 MG tablet Take 1 tablet (10 mg total) by mouth daily. 90 tablet 1  . aspirin 81 MG tablet Take 1 tablet (81 mg total) by mouth daily. 365 tablet 0  . benazepril (LOTENSIN) 40 MG tablet Take 1 tablet (40 mg total) by mouth daily. 90 tablet 1  . hydrochlorothiazide (HYDRODIURIL) 25 MG tablet Take 1 tablet (25 mg total) by mouth daily. 90 tablet 0  . Multiple Vitamin (MULTIVITAMIN) tablet Take 1 tablet by mouth daily.      Marland Kitchen omeprazole (PRILOSEC) 20 MG capsule TAKE 1 CAPSULE BY MOUTH EVERY DAY 90 capsule 1  . pravastatin (  PRAVACHOL) 40 MG tablet TAKE 2 TABLETS BY MOUTH EVERY DAY 180 tablet 1  . tamsulosin (FLOMAX) 0.4 MG CAPS capsule TAKE ONE CAPSULE BY MOUTH EVERY DAY 90 capsule 1  . traMADol (ULTRAM) 50 MG tablet Take 1 tablet (50 mg total) by mouth every 6 (six) hours as needed. 60 tablet 0   No current facility-administered medications for this visit.     REVIEW OF SYSTEMS:  [X]  denotes positive finding, [ ]  denotes negative finding Cardiac  Comments:  Chest pain or chest pressure:    Shortness of breath upon exertion:    Short of breath when lying flat:    Irregular heart rhythm:        Vascular    Pain in calf, thigh, or hip brought on by ambulation: x   Pain in feet at night that wakes you up from your sleep:     Blood clot in your veins:    Leg swelling:           PHYSICAL EXAM: Vitals:   03/13/18 0823   BP: 132/83  Pulse: 80  Resp: 18  Temp: (!) 96.8 F (36 C)  TempSrc: Oral  SpO2: 97%  Weight: 213 lb (96.6 kg)  Height: 6' (1.829 m)    GENERAL: The patient is a well-nourished male, in no acute distress. The vital signs are documented above. CARDIOVASCULAR: 2+ radial pulses bilaterally.  Abdomen moderately obese.  No aneurysm palpable PULMONARY: There is good air exchange  MUSCULOSKELETAL: There are no major deformities or cyanosis. NEUROLOGIC: No focal weakness or paresthesias are detected. SKIN: There are no ulcers or rashes noted. PSYCHIATRIC: The patient has a normal affect.  DATA:  Duplex today reveals able aneurysm sac size at 5.6 cm and no evidence of endoleak  MEDICAL ISSUES: Stable overall status post stent graft repair approximately 7- months ago. Will continue his usual activities.  We will see him again in 1 year with duplex of his stent graft and also ankle arm index   [name], MD [facility] Vascular and Vein Specialists of [facility] Tel [phone] Pager [phone]

## 2018-03-29 ENCOUNTER — Encounter: Payer: Self-pay | Admitting: Family Medicine

## 2018-03-31 ENCOUNTER — Other Ambulatory Visit: Payer: Self-pay | Admitting: Internal Medicine

## 2018-05-06 ENCOUNTER — Other Ambulatory Visit: Payer: Self-pay | Admitting: Internal Medicine

## 2018-05-14 ENCOUNTER — Telehealth: Payer: Self-pay

## 2018-05-14 DIAGNOSIS — G4700 Insomnia, unspecified: Secondary | ICD-10-CM

## 2018-05-14 MED ORDER — ALPRAZOLAM 0.5 MG PO TABS: 0.5000 mg | ORAL_TABLET | Freq: Every evening | ORAL | 1 refills | Status: DC | PRN

## 2018-05-14 NOTE — Telephone Encounter (Signed)
Patient requesting refill on Alprazolam 0.5 tablet.   Take 1 tablet by mouth twice daily as needed.  Last filled 11/08/17 for 180 tablets.

## 2018-05-14 NOTE — Telephone Encounter (Signed)
Okay to use this at night.

## 2018-05-14 NOTE — Addendum Note (Signed)
Addended by: Jon Billings on: 05/14/2018 10:43 AM   Modules accepted: Orders

## 2018-06-03 ENCOUNTER — Other Ambulatory Visit: Payer: Self-pay | Admitting: Family Medicine

## 2018-06-03 DIAGNOSIS — I739 Peripheral vascular disease, unspecified: Secondary | ICD-10-CM

## 2018-06-03 DIAGNOSIS — I10 Essential (primary) hypertension: Secondary | ICD-10-CM

## 2018-06-17 ENCOUNTER — Other Ambulatory Visit: Payer: Self-pay | Admitting: Family Medicine

## 2018-06-17 DIAGNOSIS — I10 Essential (primary) hypertension: Secondary | ICD-10-CM

## 2018-06-18 ENCOUNTER — Other Ambulatory Visit: Payer: Self-pay | Admitting: Family Medicine

## 2018-06-18 DIAGNOSIS — I10 Essential (primary) hypertension: Secondary | ICD-10-CM

## 2018-06-18 DIAGNOSIS — E782 Mixed hyperlipidemia: Secondary | ICD-10-CM

## 2018-06-18 DIAGNOSIS — N183 Chronic kidney disease, stage 3 unspecified: Secondary | ICD-10-CM

## 2018-06-18 DIAGNOSIS — R300 Dysuria: Secondary | ICD-10-CM

## 2018-06-18 DIAGNOSIS — I739 Peripheral vascular disease, unspecified: Secondary | ICD-10-CM

## 2018-06-18 MED ORDER — ASPIRIN 81 MG PO TABS: 81.0000 mg | ORAL_TABLET | Freq: Every day | ORAL | 0 refills | Status: DC

## 2018-06-18 MED ORDER — BENAZEPRIL HCL 40 MG PO TABS: 40.0000 mg | ORAL_TABLET | Freq: Every day | ORAL | 1 refills | Status: DC

## 2018-06-18 MED ORDER — OMEPRAZOLE 20 MG PO CPDR: 20.0000 mg | DELAYED_RELEASE_CAPSULE | Freq: Every day | ORAL | 1 refills | Status: DC

## 2018-06-18 MED ORDER — TAMSULOSIN HCL 0.4 MG PO CAPS: 0.4000 mg | ORAL_CAPSULE | Freq: Every day | ORAL | 1 refills | Status: DC

## 2018-06-18 NOTE — Telephone Encounter (Signed)
Copied from [place] [id number]. Topic: Quick Communication - Rx Refill/Question >> Jun 18, 2018  1:55 PM Celene Kras A wrote: Pts daughter called and stated pt is out of all of these medications. Pt has been calling for weeks about these medications and they have not been refilled. Please advise.   Medication: ALPRAZolam (XANAX) 0.5 MG tablet, amLODipine (NORVASC) 10 MG tablet, aspirin 81 MG tablet, benazepril (LOTENSIN) 40 MG tablet, omeprazole (PRILOSEC) 20 MG capsule, tamsulosin (FLOMAX) 0.4 MG CAPS capsule, traMADol (ULTRAM) 50 MG tablet.  Has the patient contacted their pharmacy? Yes.   (Agent: If no, request that the patient contact the pharmacy for the refill.) (Agent: If yes, when and what did the pharmacy advise?)  Preferred Pharmacy (with phone number or street name): [id number] [place], [address] [place] [address] Phone: [phone] Fax: [phone] Not a 24 hour pharmacy; exact hours not known.    Agent: Please be advised that RX refills may take up to 3 business days. We ask that you follow-up with your pharmacy.

## 2018-06-18 NOTE — Telephone Encounter (Signed)
Rx's for Xanax & Amlodipine had already been sent in last month. Refilled other Rx's as requested. Tramadol last filled 08/16/17, used rarely.

## 2018-06-19 ENCOUNTER — Encounter: Payer: Self-pay | Admitting: Family Medicine

## 2018-07-12 ENCOUNTER — Other Ambulatory Visit: Payer: Self-pay

## 2018-07-12 MED ORDER — PRAVASTATIN SODIUM 40 MG PO TABS: 80.0000 mg | ORAL_TABLET | Freq: Every day | ORAL | 1 refills | Status: DC

## 2018-07-25 ENCOUNTER — Telehealth: Payer: Self-pay | Admitting: Family Medicine

## 2018-07-25 DIAGNOSIS — G4700 Insomnia, unspecified: Secondary | ICD-10-CM

## 2018-07-25 NOTE — Telephone Encounter (Signed)
Copied from [place]. Topic: General - Other >> Jul 25, 2018 12:27 PM Pauline Good wrote: Reason for CRM: pt's wife passed away 2018/06/17 and pt is depressed and want to get some more Alprazolam but the pharmacy stated it's was too soon and pt only have 2 pills left. Pt want to know is it's something else he can get for his nerves and anxiety.Please advise pt.    [facility] [address]

## 2018-07-26 ENCOUNTER — Encounter: Payer: Self-pay | Admitting: Family Medicine

## 2018-07-26 MED ORDER — ALPRAZOLAM 0.5 MG PO TABS: 0.5000 mg | ORAL_TABLET | Freq: Every evening | ORAL | 0 refills | Status: DC | PRN

## 2018-07-26 NOTE — Telephone Encounter (Signed)
I spoke with patient, he is working until 7 today and 8-7 tomorrow. He is unsure of what next week's schedule looks like, so he will have to call back to schedule an appt.

## 2018-07-26 NOTE — Telephone Encounter (Signed)
Please advise 

## 2018-07-26 NOTE — Telephone Encounter (Signed)
Tried calling patient, unable to leave voicemail. Sent mychart message with openings for this afternoon.

## 2018-07-26 NOTE — Telephone Encounter (Signed)
Needs a doxy visit and a phq9.

## 2018-07-27 ENCOUNTER — Other Ambulatory Visit: Payer: Self-pay | Admitting: Family Medicine

## 2018-07-27 ENCOUNTER — Ambulatory Visit (INDEPENDENT_AMBULATORY_CARE_PROVIDER_SITE_OTHER): Payer: 59 | Admitting: Family Medicine

## 2018-07-27 ENCOUNTER — Encounter: Payer: Self-pay | Admitting: Family Medicine

## 2018-07-27 DIAGNOSIS — F064 Anxiety disorder due to known physiological condition: Secondary | ICD-10-CM | POA: Insufficient documentation

## 2018-07-27 DIAGNOSIS — J45909 Unspecified asthma, uncomplicated: Secondary | ICD-10-CM | POA: Insufficient documentation

## 2018-07-27 DIAGNOSIS — I10 Essential (primary) hypertension: Secondary | ICD-10-CM | POA: Diagnosis not present

## 2018-07-27 DIAGNOSIS — F411 Generalized anxiety disorder: Secondary | ICD-10-CM | POA: Insufficient documentation

## 2018-07-27 DIAGNOSIS — J4520 Mild intermittent asthma, uncomplicated: Secondary | ICD-10-CM

## 2018-07-27 DIAGNOSIS — F321 Major depressive disorder, single episode, moderate: Secondary | ICD-10-CM | POA: Insufficient documentation

## 2018-07-27 DIAGNOSIS — F4321 Adjustment disorder with depressed mood: Secondary | ICD-10-CM

## 2018-07-27 DIAGNOSIS — G4700 Insomnia, unspecified: Secondary | ICD-10-CM

## 2018-07-27 MED ORDER — ALPRAZOLAM 0.5 MG PO TABS: 0.5000 mg | ORAL_TABLET | Freq: Two times a day (BID) | ORAL | 0 refills | Status: DC | PRN

## 2018-07-27 MED ORDER — ALBUTEROL SULFATE HFA 108 (90 BASE) MCG/ACT IN AERS: 1.0000 | INHALATION_SPRAY | Freq: Four times a day (QID) | RESPIRATORY_TRACT | 0 refills | Status: DC | PRN

## 2018-07-27 NOTE — Progress Notes (Signed)
Virtual Visit via Video Note  I connected with [name] on 07/27/18 at  2:00 PM EDT by a video enabled telemedicine application and verified that I am speaking with the correct person using two identifiers.  Location: Patient: work Provider:    I discussed the limitations of evaluation and management by telemedicine and the availability of in person appointments. The patient expressed understanding and agreed to proceed.  History of Present Illness:    Observations/Objective:   Assessment and Plan:   Follow Up Instructions:    I discussed the assessment and treatment plan with the patient. The patient was provided an opportunity to ask questions and all were answered. The patient agreed with the plan and demonstrated an understanding of the instructions.   The patient was advised to call back or seek an in-person evaluation if the symptoms worsen or if the condition fails to improve as anticipated.  Interactive video and audio telecommunications were attempted between myself and the patient. However they failed due to the patient having technical difficulties or not having access to video capability. We continued and completed with audio only.Interactive video and audio telecommunications were attempted between myself and the patient. However they failed due to the patient having technical difficulties or not having access to video capability.  I provided 32   Established Patient Office Visit  Subjective:  Patient ID: [name], male    DOB: December 23, 1955  Age: 63 y.o. MRN: [id number]  CC:  Chief Complaint  Patient presents with  . Chest Pain    Pt states he has been having frequent panic attacks at night Pt states he is out of xanax and either needs a refill or something else to help his wife just died 2 weeks ago.    HPI [name] presents for follow-up of his anxiety that is worsened after his late wife's death 2 weeks ago.  They have been married for 42  years.  She was scheduled to return home 1 week before she passed away in rehab.  Normally uses the Xanax mostly at nighttime but has been feeling anxious in the daytime as well.  When he feels anxious there is a tightness in his chest.  He denies any exertional chest pain shortness of breath nausea or diaphoresis.  He has a long smoking history and occasionally gets wheezy.  This is been relieved when he inhales a puff from his wife's pro-air.  He would like his own prescription.  He tells me that his blood pressures been under better control since we increased the Norvasc from 5 mg to 10 mg.  His daughter who is in EMS lives and has been checking on him.  Past Medical History:  Diagnosis Date  . AAA (abdominal aortic aneurysm) ([place])   . AAA (abdominal aortic aneurysm) without rupture ([place])   . Arthritis   . BPH (benign prostatic hyperplasia)   . Claudication [facility])    lower extremities  . Claustrophobia    panic attacks  . COLONIC POLYPS, HX OF 05/02/2007  . DVT (deep venous thrombosis) ([place])   . Dyspnea    with exertion  . GERD 11/08/2006  . HYPERCHOLESTEROLEMIA, SEVERE 05/02/2007  . HYPERTENSION 11/08/2006  . LOW BACK PAIN 11/22/2007  . 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 PONV (postoperative nausea and vomiting)   . Seizures ([place])    from concussion several months ago has been having headache  . Weak urine stream    Slow to get started  Past Surgical History:  Procedure Laterality Date  . ABDOMINAL AORTIC ENDOVASCULAR STENT GRAFT N/A 07/17/2017   Procedure: ABDOMINAL AORTIC ENDOVASCULAR STENT GRAFT REPAIR WITH ILIAC BRANCH DEVICE;  Surgeon: [name], MD;  Location: [facility] OR;  Service: Vascular;  Laterality: N/A;  . ANGIOPLASTY  09/20/10   Rt AK to tibioperoneal trunk BPG w/ vein patch angioplasty of tibioperoneal trunk   . carpel tunnel surgery  per pt. 6-7 years ago   bilateral  (done 2 weeks apart)  . FOOT SURGERY     right x2  . MOHS SURGERY     lower lip  . PR  VEIN BYPASS GRAFT,AORTO-FEM-POP      Family History  Problem Relation Age of Onset  . Adrenal disorder Brother   . Hyperlipidemia Brother   . Hypertension Brother   . Heart disease Brother        Heart Disease before age 54  . Heart attack Brother   . Heart disease Mother        Heart Disease before age 98  . Hyperlipidemia Mother   . Hypertension Mother   . Heart attack Mother   . Heart disease Father        Heart Disease before age 18  . Hyperlipidemia Father   . Heart attack Father   . Pulmonary embolism Sister     Social History   Socioeconomic History  . Marital status: Married    Spouse name: Not on file  . Number of children: Not on file  . Years of education: Not on file  . Highest education level: Not on file  Occupational History  . Not on file  Social Needs  . Financial resource strain: Not on file  . Food insecurity:    Worry: Not on file    Inability: Not on file  . Transportation needs:    Medical: Not on file    Non-medical: Not on file  Tobacco Use  . Smoking status: Current Every Day Smoker    Packs/day: 1.00    Years: 40.00    Pack years: 40.00    Types: Cigarettes  . Smokeless tobacco: Never Used  Substance and Sexual Activity  . Alcohol use: No    Alcohol/week: 0.0 standard drinks  . Drug use: No  . Sexual activity: Not on file  Lifestyle  . Physical activity:    Days per week: Not on file    Minutes per session: Not on file  . Stress: Not on file  Relationships  . Social connections:    Talks on phone: Not on file    Gets together: Not on file    Attends religious service: Not on file    Active member of club or organization: Not on file    Attends meetings of clubs or organizations: Not on file    Relationship status: Not on file  . Intimate partner violence:    Fear of current or ex partner: Not on file    Emotionally abused: Not on file    Physically abused: Not on file    Forced sexual activity: Not on file  Other Topics  Concern  . Not on file  Social History Narrative  . Not on file    Outpatient Medications Prior to Visit  Medication Sig Dispense Refill  . amLODipine (NORVASC) 10 MG tablet TAKE 1 TABLET BY MOUTH EVERY DAY 90 tablet 1  . aspirin 81 MG tablet Take 1 tablet (81 mg total) by mouth daily. Planada  tablet 0  . benazepril (LOTENSIN) 40 MG tablet Take 1 tablet (40 mg total) by mouth daily. 90 tablet 1  . hydrochlorothiazide (HYDRODIURIL) 25 MG tablet TAKE 1 TABLET BY MOUTH EVERY DAY 90 tablet 0  . Multiple Vitamin (MULTIVITAMIN) tablet Take 1 tablet by mouth daily.      Marland Kitchen omeprazole (PRILOSEC) 20 MG capsule Take 1 capsule (20 mg total) by mouth daily. 90 capsule 1  . pravastatin (PRAVACHOL) 40 MG tablet Take 2 tablets (80 mg total) by mouth daily. 180 tablet 1  . tamsulosin (FLOMAX) 0.4 MG CAPS capsule Take 1 capsule (0.4 mg total) by mouth daily. 90 capsule 1  . traMADol (ULTRAM) 50 MG tablet Take 1 tablet (50 mg total) by mouth every 6 (six) hours as needed. 60 tablet 0  . ALPRAZolam (XANAX) 0.5 MG tablet Take 1 tablet (0.5 mg total) by mouth at bedtime as needed for anxiety. 30 tablet 0   No facility-administered medications prior to visit.     Allergies  Allergen Reactions  . Cortisone   . Sulfonamide Derivatives     REACTION: rash    ROS Review of Systems  Constitutional: Negative.   HENT: Negative.   Respiratory: Positive for cough and wheezing.   Cardiovascular: Negative for palpitations.  Gastrointestinal: Negative for nausea and vomiting.  Skin: Negative for pallor and rash.  Allergic/Immunologic: Negative for immunocompromised state.  Psychiatric/Behavioral: Positive for dysphoric mood. The patient is nervous/anxious.       Objective:    Physical Exam  Constitutional: He is oriented to person, place, and time. No distress.  Pulmonary/Chest: Effort normal.  Neurological: He is alert and oriented to person, place, and time.  Psychiatric: He has a normal mood and affect.  His behavior is normal.    There were no vitals taken for this visit. Wt Readings from Last 3 Encounters:  03/13/18 213 lb (96.6 kg)  01/16/18 220 lb 4 oz (99.9 kg)  08/22/17 211 lb (95.7 kg)     Health Maintenance Due  Topic Date Due  . Hepatitis C Screening  21-Jul-1955  . HIV Screening  06/13/1970  . TETANUS/TDAP  06/13/1974  . COLONOSCOPY  06/12/2005    There are no preventive care reminders to display for this patient.  Lab Results  Component Value Date   TSH 1.32 01/16/2018   Lab Results  Component Value Date   WBC 6.9 01/16/2018   HGB 15.6 01/16/2018   HCT 45.0 01/16/2018   MCV 94.6 01/16/2018   PLT 215.0 01/16/2018   Lab Results  Component Value Date   NA 129 (L) 01/16/2018   K 4.8 01/16/2018   CO2 27 01/16/2018   GLUCOSE 94 01/16/2018   BUN 17 01/16/2018   CREATININE 1.67 (H) 01/16/2018   BILITOT 0.5 01/16/2018   ALKPHOS 40 01/16/2018   AST 21 01/16/2018   ALT 21 01/16/2018   PROT 7.6 01/16/2018   ALBUMIN 4.8 01/16/2018   CALCIUM 10.1 01/16/2018   ANIONGAP 10 07/18/2017   GFR 44.44 (L) 01/16/2018   Lab Results  Component Value Date   CHOL 180 01/16/2018   Lab Results  Component Value Date   HDL 28.90 (L) 01/16/2018   No results found for: [facility] Lab Results  Component Value Date   TRIG (H) 01/16/2018    512.0 Triglyceride is over 400; calculations on Lipids are invalid.   Lab Results  Component Value Date   CHOLHDL 6 01/16/2018   Lab Results  Component Value Date   HGBA1C  5.6 06/07/2006      Assessment & Plan:   Problem List Items Addressed This Visit      Cardiovascular and Mediastinum   Essential hypertension - Primary     Respiratory   Reactive airway disease   Relevant Medications   albuterol (VENTOLIN HFA) 108 (90 Base) MCG/ACT inhaler     Other   Grieving   Relevant Medications   ALPRAZolam (XANAX) 0.5 MG tablet   Anxiety disorder due to general medical condition   Relevant Medications   ALPRAZolam (XANAX) 0.5  MG tablet    Other Visit Diagnoses    Insomnia, unspecified type          Meds ordered this encounter  Medications  . ALPRAZolam (XANAX) 0.5 MG tablet    Sig: Take 1 tablet (0.5 mg total) by mouth 2 (two) times daily as needed for anxiety.    Dispense:  30 tablet    Refill:  0  . albuterol (VENTOLIN HFA) 108 (90 Base) MCG/ACT inhaler    Sig: Inhale 1 puff into the lungs every 6 (six) hours as needed for wheezing or shortness of breath.    Dispense:  1 Inhaler    Refill:  0    Follow-up: No follow-ups on file.    Libby Maw, MD25 minutes of non-face-to-face time during this encounter.  Patient will follow-up in a month for re-drip check of his anxiety hypertension and elevated lipids. Libby Maw, MD

## 2018-07-27 NOTE — Telephone Encounter (Signed)
Dr. Ethelene Hal please advise

## 2018-10-04 ENCOUNTER — Other Ambulatory Visit: Payer: Self-pay

## 2018-10-04 DIAGNOSIS — J4520 Mild intermittent asthma, uncomplicated: Secondary | ICD-10-CM

## 2018-10-04 MED ORDER — ALBUTEROL SULFATE HFA 108 (90 BASE) MCG/ACT IN AERS: 1.0000 | INHALATION_SPRAY | Freq: Four times a day (QID) | RESPIRATORY_TRACT | 1 refills | Status: DC | PRN

## 2018-11-01 ENCOUNTER — Other Ambulatory Visit: Payer: Self-pay | Admitting: Family Medicine

## 2018-11-01 DIAGNOSIS — F4321 Adjustment disorder with depressed mood: Secondary | ICD-10-CM

## 2018-11-20 ENCOUNTER — Other Ambulatory Visit: Payer: Self-pay | Admitting: Family Medicine

## 2018-11-20 DIAGNOSIS — I10 Essential (primary) hypertension: Secondary | ICD-10-CM

## 2018-11-20 DIAGNOSIS — R300 Dysuria: Secondary | ICD-10-CM

## 2018-11-20 DIAGNOSIS — I739 Peripheral vascular disease, unspecified: Secondary | ICD-10-CM

## 2018-11-22 ENCOUNTER — Other Ambulatory Visit: Payer: Self-pay | Admitting: Family Medicine

## 2018-11-22 DIAGNOSIS — J4520 Mild intermittent asthma, uncomplicated: Secondary | ICD-10-CM

## 2018-12-11 ENCOUNTER — Other Ambulatory Visit: Payer: Self-pay | Admitting: Family Medicine

## 2018-12-11 DIAGNOSIS — I10 Essential (primary) hypertension: Secondary | ICD-10-CM

## 2018-12-11 DIAGNOSIS — N183 Chronic kidney disease, stage 3 unspecified: Secondary | ICD-10-CM

## 2018-12-18 ENCOUNTER — Other Ambulatory Visit: Payer: Self-pay

## 2018-12-18 DIAGNOSIS — Z20822 Contact with and (suspected) exposure to covid-19: Secondary | ICD-10-CM

## 2018-12-20 LAB — NOVEL CORONAVIRUS, NAA: SARS-CoV-2, NAA: NOT DETECTED

## 2018-12-28 ENCOUNTER — Other Ambulatory Visit: Payer: Self-pay | Admitting: Family Medicine

## 2018-12-28 DIAGNOSIS — J4520 Mild intermittent asthma, uncomplicated: Secondary | ICD-10-CM

## 2018-12-28 DIAGNOSIS — I10 Essential (primary) hypertension: Secondary | ICD-10-CM

## 2018-12-28 DIAGNOSIS — E782 Mixed hyperlipidemia: Secondary | ICD-10-CM

## 2018-12-28 DIAGNOSIS — I739 Peripheral vascular disease, unspecified: Secondary | ICD-10-CM

## 2018-12-28 DIAGNOSIS — F4321 Adjustment disorder with depressed mood: Secondary | ICD-10-CM

## 2019-02-03 ENCOUNTER — Other Ambulatory Visit: Payer: Self-pay | Admitting: Family Medicine

## 2019-02-03 DIAGNOSIS — F4321 Adjustment disorder with depressed mood: Secondary | ICD-10-CM

## 2019-02-17 ENCOUNTER — Other Ambulatory Visit: Payer: Self-pay | Admitting: Family Medicine

## 2019-02-17 DIAGNOSIS — J4520 Mild intermittent asthma, uncomplicated: Secondary | ICD-10-CM

## 2019-03-10 ENCOUNTER — Other Ambulatory Visit: Payer: Self-pay | Admitting: Family Medicine

## 2019-03-10 DIAGNOSIS — F4321 Adjustment disorder with depressed mood: Secondary | ICD-10-CM

## 2019-03-11 ENCOUNTER — Encounter: Payer: Self-pay | Admitting: Family Medicine

## 2019-03-11 ENCOUNTER — Other Ambulatory Visit: Payer: Self-pay | Admitting: Family Medicine

## 2019-03-11 ENCOUNTER — Telehealth (INDEPENDENT_AMBULATORY_CARE_PROVIDER_SITE_OTHER): Payer: Managed Care, Other (non HMO) | Admitting: Family Medicine

## 2019-03-11 ENCOUNTER — Other Ambulatory Visit: Payer: Self-pay

## 2019-03-11 VITALS — BP 140/90 | Ht 73.0 in

## 2019-03-11 DIAGNOSIS — I10 Essential (primary) hypertension: Secondary | ICD-10-CM

## 2019-03-11 DIAGNOSIS — J439 Emphysema, unspecified: Secondary | ICD-10-CM

## 2019-03-11 DIAGNOSIS — J4520 Mild intermittent asthma, uncomplicated: Secondary | ICD-10-CM

## 2019-03-11 DIAGNOSIS — F064 Anxiety disorder due to known physiological condition: Secondary | ICD-10-CM | POA: Diagnosis not present

## 2019-03-11 MED ORDER — ALBUTEROL SULFATE HFA 108 (90 BASE) MCG/ACT IN AERS: 1.0000 | INHALATION_SPRAY | Freq: Four times a day (QID) | RESPIRATORY_TRACT | 0 refills | Status: DC | PRN

## 2019-03-11 MED ORDER — SPIRIVA HANDIHALER 18 MCG IN CAPS: 18.0000 ug | ORAL_CAPSULE | Freq: Every day | RESPIRATORY_TRACT | 12 refills | Status: DC

## 2019-03-11 NOTE — Telephone Encounter (Signed)
Attempted to reach pt, no answer. Left vm to call back. Pt needs an appointment.

## 2019-03-11 NOTE — Telephone Encounter (Signed)
Pt has virtual video call 03/11/19

## 2019-03-11 NOTE — Progress Notes (Signed)
Established Patient Office Visit  Subjective:  Patient ID: [name], male    DOB: 1956-02-09  Age: 63 y.o. MRN: [id number]  CC:  Chief Complaint  Patient presents with  . Follow-up    f/u refill on medications.    HPI [name] presents for follow-up of his COPD and anxiety.  Continues to use the Xanax regularly.  Takes 1 pill at night and approximately 1/2 pill more so at some point during the day.  Particularly bad holiday season for him with the recent loss of his wife.  Rarely if ever uses the tramadol.  Has been using albuterol on a regular basis.  Continues to smoke.  He does not tolerate the flu vaccine and will not take it.  He continues to work.  Assures me that everyone is wearing the mask.  Past Medical History:  Diagnosis Date  . AAA (abdominal aortic aneurysm) ([place])   . AAA (abdominal aortic aneurysm) without rupture ([place])   . Arthritis   . BPH (benign prostatic hyperplasia)   . Claudication [facility])    lower extremities  . Claustrophobia    panic attacks  . COLONIC POLYPS, HX OF 05/02/2007  . DVT (deep venous thrombosis) ([place])   . Dyspnea    with exertion  . GERD 11/08/2006  . HYPERCHOLESTEROLEMIA, SEVERE 05/02/2007  . HYPERTENSION 11/08/2006  . LOW BACK PAIN 11/22/2007  . 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 PONV (postoperative nausea and vomiting)   . Seizures ([place])    from concussion several months ago has been having headache  . Weak urine stream    Slow to get started    Past Surgical History:  Procedure Laterality Date  . ABDOMINAL AORTIC ENDOVASCULAR STENT GRAFT N/A 07/17/2017   Procedure: ABDOMINAL AORTIC ENDOVASCULAR STENT GRAFT REPAIR WITH ILIAC BRANCH DEVICE;  Surgeon: [name], MD;  Location: [facility] OR;  Service: Vascular;  Laterality: N/A;  . ANGIOPLASTY  09/20/10   Rt AK to tibioperoneal trunk BPG w/ vein patch angioplasty of tibioperoneal trunk   . carpel tunnel surgery  per pt. 6-7 years ago   bilateral  (done 2 weeks apart)    . FOOT SURGERY     right x2  . MOHS SURGERY     lower lip  . PR VEIN BYPASS GRAFT,AORTO-FEM-POP      Family History  Problem Relation Age of Onset  . Adrenal disorder Brother   . Hyperlipidemia Brother   . Hypertension Brother   . Heart disease Brother        Heart Disease before age 33  . Heart attack Brother   . Heart disease Mother        Heart Disease before age 40  . Hyperlipidemia Mother   . Hypertension Mother   . Heart attack Mother   . Heart disease Father        Heart Disease before age 39  . Hyperlipidemia Father   . Heart attack Father   . Pulmonary embolism Sister     Social History   Socioeconomic History  . Marital status: Married    Spouse name: Not on file  . Number of children: Not on file  . Years of education: Not on file  . Highest education level: Not on file  Occupational History  . Not on file  Tobacco Use  . Smoking status: Current Every Day Smoker    Packs/day: 1.00    Years: 40.00    Pack years: 40.00  Types: Cigarettes  . Smokeless tobacco: Never Used  Substance and Sexual Activity  . Alcohol use: No    Alcohol/week: 0.0 standard drinks  . Drug use: No  . Sexual activity: Not on file  Other Topics Concern  . Not on file  Social History Narrative  . Not on file   Social Determinants of Health   Financial Resource Strain:   . Difficulty of Paying Living Expenses: Not on file  Food Insecurity:   . Worried About Charity fundraiser in the Last Year: Not on file  . Ran Out of Food in the Last Year: Not on file  Transportation Needs:   . Lack of Transportation (Medical): Not on file  . Lack of Transportation (Non-Medical): Not on file  Physical Activity:   . Days of Exercise per Week: Not on file  . Minutes of Exercise per Session: Not on file  Stress:   . Feeling of Stress : Not on file  Social Connections:   . Frequency of Communication with Friends and Family: Not on file  . Frequency of Social Gatherings with Friends  and Family: Not on file  . Attends Religious Services: Not on file  . Active Member of Clubs or Organizations: Not on file  . Attends Archivist Meetings: Not on file  . Marital Status: Not on file  Intimate Partner Violence:   . Fear of Current or Ex-Partner: Not on file  . Emotionally Abused: Not on file  . Physically Abused: Not on file  . Sexually Abused: Not on file    Outpatient Medications Prior to Visit  Medication Sig Dispense Refill  . amLODipine (NORVASC) 10 MG tablet TAKE 1 TABLET BY MOUTH EVERY DAY 90 tablet 1  . ASPIRIN LOW DOSE 81 MG EC tablet TAKE 1 TABLET BY MOUTH EVERY DAY 90 tablet 1  . benazepril (LOTENSIN) 40 MG tablet TAKE 1 TABLET BY MOUTH EVERY DAY 90 tablet 1  . hydrochlorothiazide (HYDRODIURIL) 25 MG tablet TAKE 1 TABLET BY MOUTH EVERY DAY 90 tablet 0  . Multiple Vitamin (MULTIVITAMIN) tablet Take 1 tablet by mouth daily.      Marland Kitchen omeprazole (PRILOSEC) 20 MG capsule TAKE 1 CAPSULE BY MOUTH EVERY DAY 90 capsule 1  . pravastatin (PRAVACHOL) 40 MG tablet TAKE 2 TABLETS BY MOUTH EVERY DAY 180 tablet 1  . tamsulosin (FLOMAX) 0.4 MG CAPS capsule TAKE 1 CAPSULE BY MOUTH EVERY DAY 90 capsule 1  . traMADol (ULTRAM) 50 MG tablet Take 1 tablet (50 mg total) by mouth every 6 (six) hours as needed. 60 tablet 0  . albuterol (VENTOLIN HFA) 108 (90 Base) MCG/ACT inhaler INHALE 1 PUFF INTO THE LUNGS EVERY 6 (SIX) HOURS AS NEEDED FOR WHEEZING OR SHORTNESS OF BREATH. 18 g 0  . ALPRAZolam (XANAX) 0.5 MG tablet TAKE 1 TABLET BY MOUTH TWICE A DAY AS NEEDED FOR ANXIETY 60 tablet 0  . aspirin 81 MG tablet Take 1 tablet (81 mg total) by mouth daily. (Patient not taking: Reported on 03/11/2019) 365 tablet 0   No facility-administered medications prior to visit.    Allergies  Allergen Reactions  . Cortisone   . Sulfonamide Derivatives     REACTION: rash    ROS Review of Systems  Constitutional: Negative for diaphoresis, fatigue, fever and unexpected weight change.    HENT: Negative.   Eyes: Negative for photophobia and visual disturbance.  Respiratory: Positive for cough and wheezing. Negative for chest tightness and shortness of breath.   Cardiovascular:  Negative.   Gastrointestinal: Negative.   Genitourinary: Negative.   Musculoskeletal: Negative for gait problem and joint swelling.  Neurological: Negative for light-headedness.  Hematological: Negative.   Psychiatric/Behavioral: The patient is nervous/anxious.       Objective:    Physical Exam  Constitutional: He is oriented to person, place, and time. No distress.  Pulmonary/Chest: Effort normal.  Neurological: He is alert and oriented to person, place, and time.  Psychiatric: He has a normal mood and affect. His behavior is normal.    BP 140/90 Comment: last night  Ht 6\' 1"  (1.854 m)   BMI 28.10 kg/m  Wt Readings from Last 3 Encounters:  03/13/18 213 lb (96.6 kg)  01/16/18 220 lb 4 oz (99.9 kg)  08/22/17 211 lb (95.7 kg)     Health Maintenance Due  Topic Date Due  . Hepatitis C Screening  10[birth year]29/1955  . HIV Screening  06/13/1970  . TETANUS/TDAP  06/13/1974  . COLONOSCOPY  06/12/2005  . INFLUENZA VACCINE  10/13/2018    There are no preventive care reminders to display for this patient.  Lab Results  Component Value Date   TSH 1.32 01/16/2018   Lab Results  Component Value Date   WBC 6.9 01/16/2018   HGB 15.6 01/16/2018   HCT 45.0 01/16/2018   MCV 94.6 01/16/2018   PLT 215.0 01/16/2018   Lab Results  Component Value Date   NA 129 (L) 01/16/2018   K 4.8 01/16/2018   CO2 27 01/16/2018   GLUCOSE 94 01/16/2018   BUN 17 01/16/2018   CREATININE 1.67 (H) 01/16/2018   BILITOT 0.5 01/16/2018   ALKPHOS 40 01/16/2018   AST 21 01/16/2018   ALT 21 01/16/2018   PROT 7.6 01/16/2018   ALBUMIN 4.8 01/16/2018   CALCIUM 10.1 01/16/2018   ANIONGAP 10 07/18/2017   GFR 44.44 (L) 01/16/2018   Lab Results  Component Value Date   CHOL 180 01/16/2018   Lab Results   Component Value Date   HDL 28.90 (L) 01/16/2018   No results found for: [facility] Lab Results  Component Value Date   TRIG (H) 01/16/2018    512.0 Triglyceride is over 400; calculations on Lipids are invalid.   Lab Results  Component Value Date   CHOLHDL 6 01/16/2018   Lab Results  Component Value Date   HGBA1C 5.6 06/07/2006      Assessment & Plan:   Problem List Items Addressed This Visit      Respiratory   Reactive airway disease   Relevant Medications   albuterol (VENTOLIN HFA) 108 (90 Base) MCG/ACT inhaler   Pulmonary emphysema (HCC)   Relevant Medications   albuterol (VENTOLIN HFA) 108 (90 Base) MCG/ACT inhaler   tiotropium (SPIRIVA HANDIHALER) 18 MCG inhalation capsule     Other   Anxiety disorder due to general medical condition - Primary      Meds ordered this encounter  Medications  . albuterol (VENTOLIN HFA) 108 (90 Base) MCG/ACT inhaler    Sig: Inhale 1 puff into the lungs every 6 (six) hours as needed for wheezing or shortness of breath.    Dispense:  18 g    Refill:  0  . tiotropium (SPIRIVA HANDIHALER) 18 MCG inhalation capsule    Sig: Place 1 capsule (18 mcg total) into inhaler and inhale daily.    Dispense:  30 capsule    Refill:  12    Follow-up: Return in about 3 months (around 06/09/2019).   Rx for Xanax sent to the  pharmacy.   discussed again trying to use it sparingly.  Expressed my concern again about taking tramadol.  At this point he is wearing taking the tramadol he tells me.  Continues to smoke.  History of COPD.  Using albuterol regularly.  Will add Spiriva. Libby Maw, MD   Virtual Visit via Video Note  I connected with [name] on 03/11/19 at  4:00 PM EST by a video enabled telemedicine application and verified that I am speaking with the correct person using two identifiers.  Location: Patient: work  Provider:    I discussed the limitations of evaluation and management by telemedicine and the availability of  in person appointments. The patient expressed understanding and agreed to proceed.  History of Present Illness:    Observations/Objective:   Assessment and Plan:   Follow Up Instructions:    I discussed the assessment and treatment plan with the patient. The patient was provided an opportunity to ask questions and all were answered. The patient agreed with the plan and demonstrated an understanding of the instructions.   The patient was advised to call back or seek an in-person evaluation if the symptoms worsen or if the condition fails to improve as anticipated.  I provided 15 minutes of non-face-to-face time during this encounter.  Interactive video and audio telecommunications were attempted between myself and the patient. However they failed due to the patient having technical difficulties or not having access to video capability. We continued and completed with audio only.  Libby Maw, MD

## 2019-03-11 NOTE — Telephone Encounter (Signed)
Sent pt a Therapist, music. He needs an office visit

## 2019-04-05 ENCOUNTER — Other Ambulatory Visit: Payer: Self-pay | Admitting: Family Medicine

## 2019-04-05 DIAGNOSIS — J439 Emphysema, unspecified: Secondary | ICD-10-CM

## 2019-04-05 DIAGNOSIS — J4520 Mild intermittent asthma, uncomplicated: Secondary | ICD-10-CM

## 2019-04-21 ENCOUNTER — Other Ambulatory Visit: Payer: Self-pay | Admitting: Family Medicine

## 2019-04-21 DIAGNOSIS — I10 Essential (primary) hypertension: Secondary | ICD-10-CM

## 2019-04-21 DIAGNOSIS — R300 Dysuria: Secondary | ICD-10-CM

## 2019-04-21 DIAGNOSIS — I739 Peripheral vascular disease, unspecified: Secondary | ICD-10-CM

## 2019-04-21 DIAGNOSIS — F4321 Adjustment disorder with depressed mood: Secondary | ICD-10-CM

## 2019-04-22 ENCOUNTER — Other Ambulatory Visit: Payer: Self-pay | Admitting: Family Medicine

## 2019-04-22 DIAGNOSIS — J439 Emphysema, unspecified: Secondary | ICD-10-CM

## 2019-04-22 DIAGNOSIS — J4520 Mild intermittent asthma, uncomplicated: Secondary | ICD-10-CM

## 2019-04-24 ENCOUNTER — Other Ambulatory Visit: Payer: Self-pay | Admitting: Family Medicine

## 2019-04-24 ENCOUNTER — Encounter: Payer: Self-pay | Admitting: Family Medicine

## 2019-04-24 ENCOUNTER — Other Ambulatory Visit: Payer: Self-pay

## 2019-04-24 DIAGNOSIS — J439 Emphysema, unspecified: Secondary | ICD-10-CM

## 2019-04-24 DIAGNOSIS — J4520 Mild intermittent asthma, uncomplicated: Secondary | ICD-10-CM

## 2019-04-24 MED ORDER — ALBUTEROL SULFATE HFA 108 (90 BASE) MCG/ACT IN AERS: 1.0000 | INHALATION_SPRAY | Freq: Four times a day (QID) | RESPIRATORY_TRACT | 0 refills | Status: DC | PRN

## 2019-05-01 IMAGING — CT CT CTA ABD/PEL W/CM AND/OR W/O CM
1 of 3 series · 13 of 32 positions shown, 17 images · IV contrast (APPLIED)
Comparison: 08/12/2014

CLINICAL DATA: Abdominal aortic aneurysm

EXAM:
CTA ABDOMEN AND PELVIS wITHOUT AND WITH CONTRAST
TECHNIQUE: Multidetector CT imaging of the abdomen and pelvis was performed
using the standard protocol during bolus administration of
intravenous contrast. Multiplanar reconstructed images and MIPs were
obtained and reviewed to evaluate the vascular anatomy.
CONTRAST:  75mL RNRL5Q-Q8M IOPAMIDOL (RNRL5Q-Q8M) INJECTION 76%

[Series 5: pre stent angio · axial · non-contrast · 0.88mm/px · z∈[-489,-61]mm · 13 of 236 slices shown, 17 images]
[im 11/236  soft-tissue]
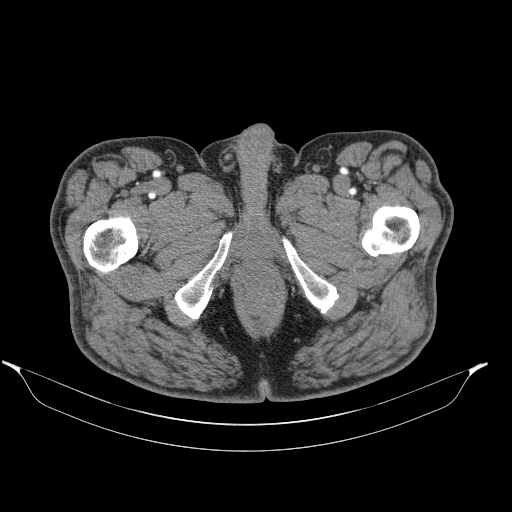
[im 11/236  bone]
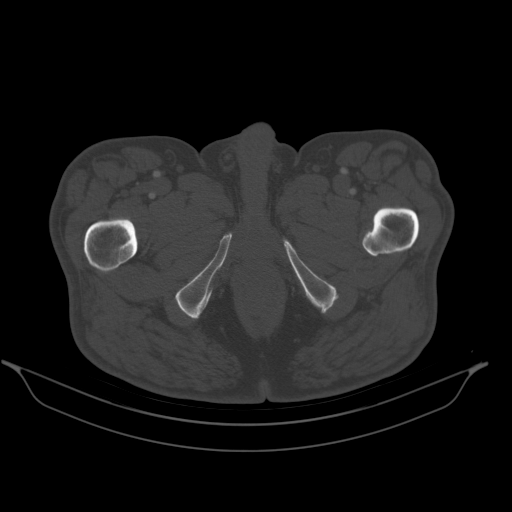
[im 33/236  soft-tissue]
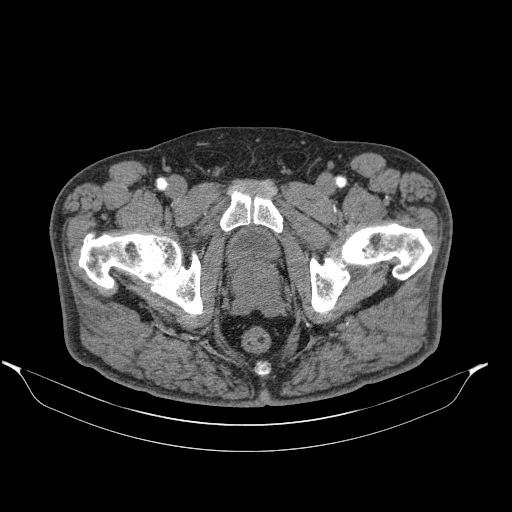
[im 54/236  soft-tissue]
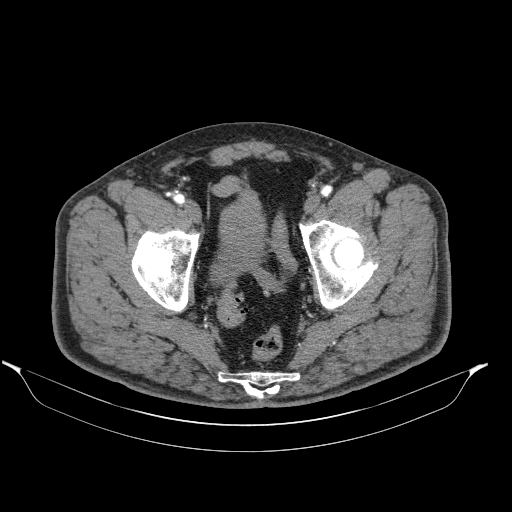
[im 75/236  soft-tissue]
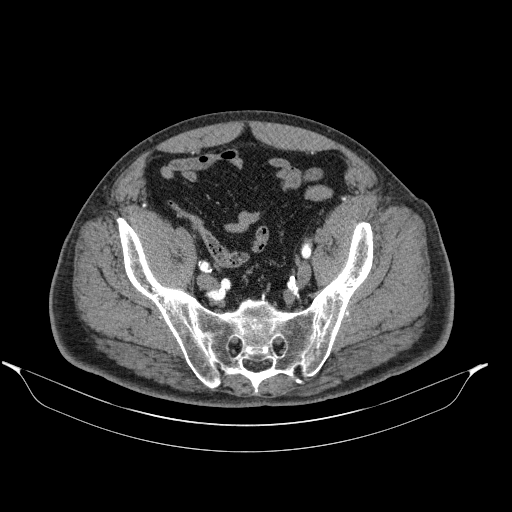
[im 97/236  soft-tissue]
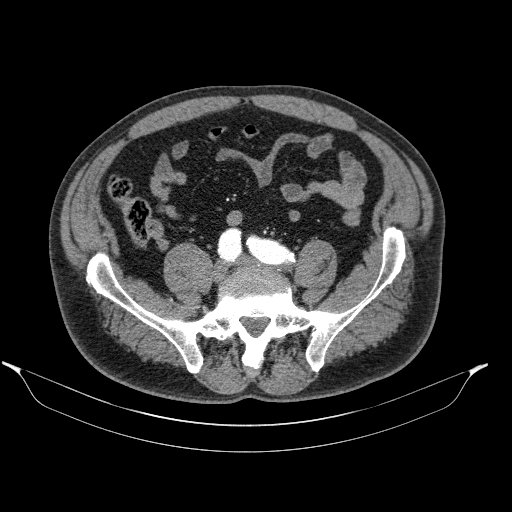
[im 118/236  soft-tissue]
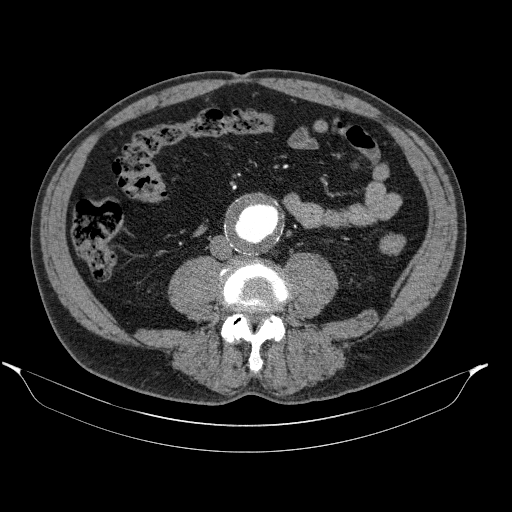
[im 139/236  soft-tissue]
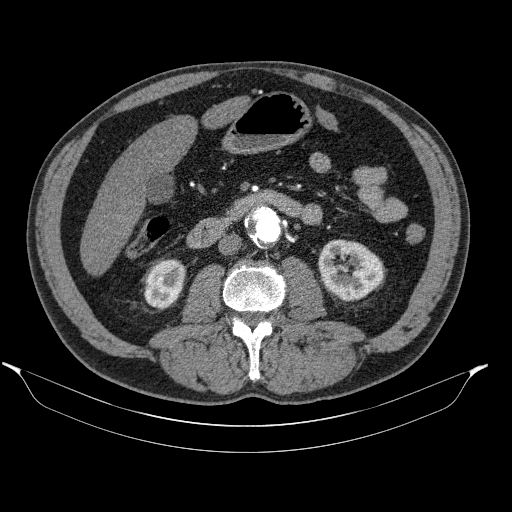
[im 161/236  soft-tissue]
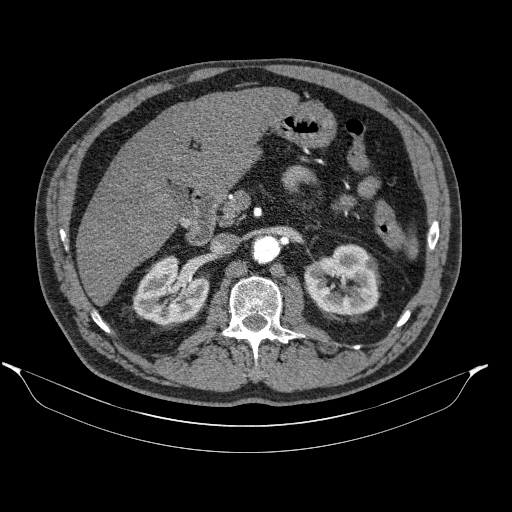
[im 182/236  soft-tissue]
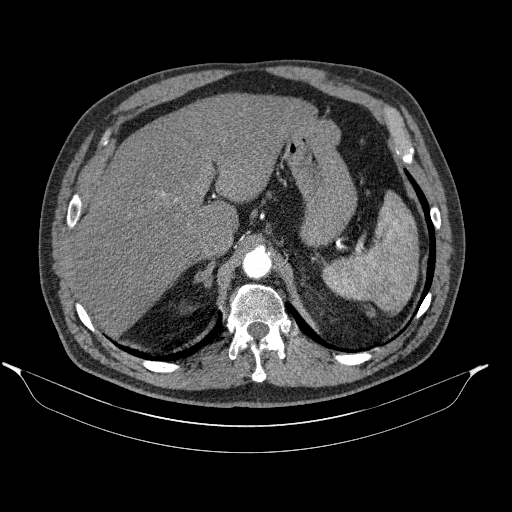
[im 182/236  bone]
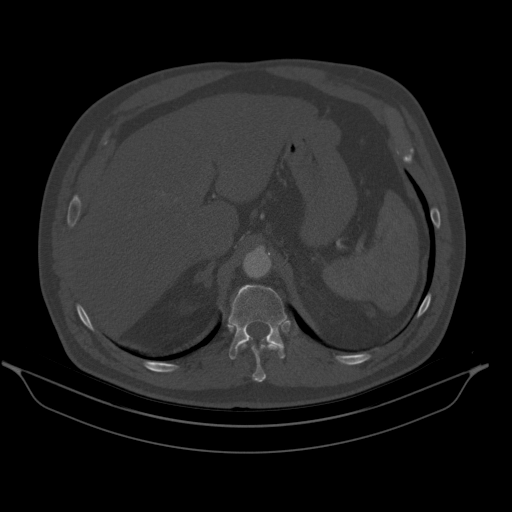
[im 193/236  lung]
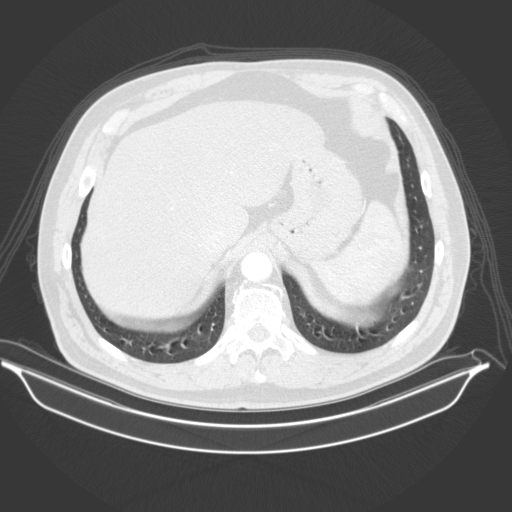
[im 203/236  soft-tissue]
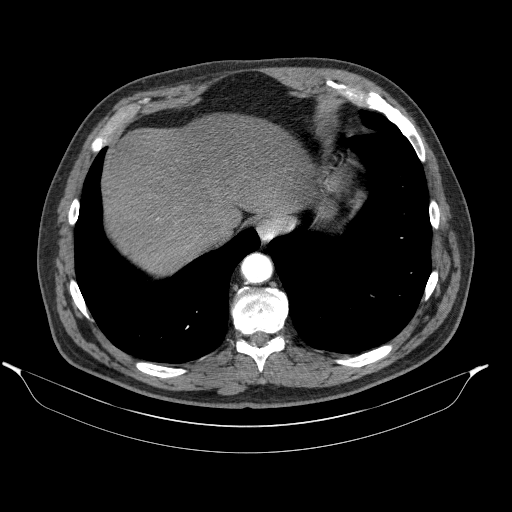
[im 203/236  lung]
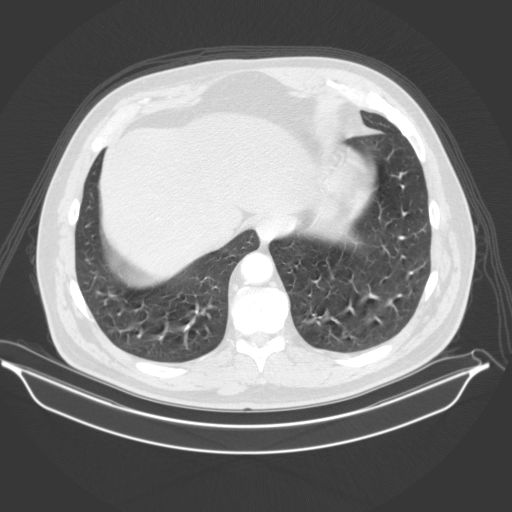
[im 214/236  lung]
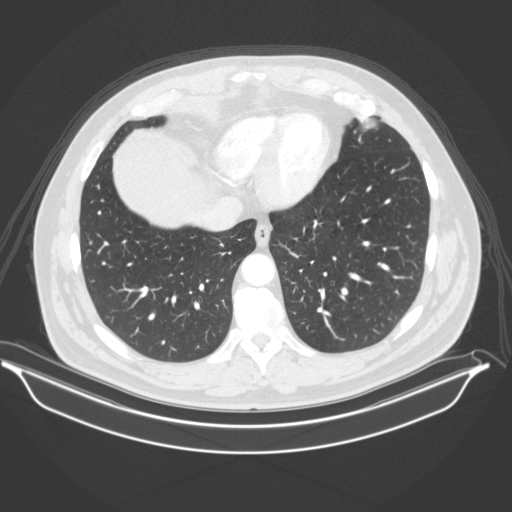
[im 225/236  soft-tissue]
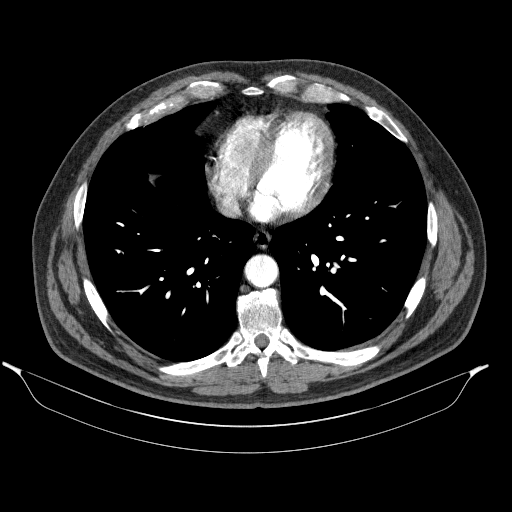
[im 225/236  lung]
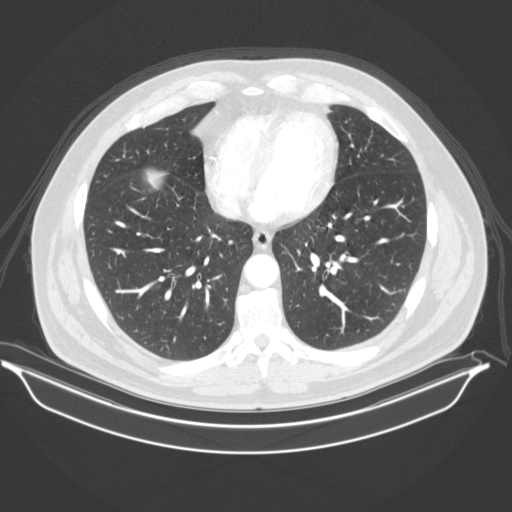

[13 of 32 positions shown; findings below may reference images not displayed]

FINDINGS: VASCULAR

Aorta: Infrarenal abdominal aortic aneurysm maximal AP and
transverse diameters are 5.5 and 5.4 cm compared with 4.8 and 4.5 cm
on the prior study. There is chronic mural thrombus. Atherosclerotic
calcifications in the aorta are noted.

Celiac: Patent.  Branch vessels patent.

SMA: Smooth atherosclerotic plaque at the origin. No significant
narrowing. Branch vessels grossly patent.

Renals: 2 right and 2 left renal arteries. Significant narrowing at
the origin of the dominant right upper renal artery. Small right
accessory renal artery is grossly patent. Significant narrowing at
the origin of the dominant left upper renal artery. There is
significant disease at the origin of the accessory lower pole left
renal artery. These findings have progressed in the dominant renal
arteries bilaterally.

IMA: Origin is occluded.  Branch vessels reconstitute.

Inflow: Right and left common iliac artery aneurysm measure 2.6 and
2.7 cm compared with 2.2 and 2.4 cm respectively on the prior study.
No luminal narrowing. Atherosclerotic calcification and some smooth
plaque are noted. Right external iliac artery is patent. Severe
disease and narrowing at the origin of the right internal iliac
artery. Mild narrowing at the origin of the left external iliac
artery. There is significant narrowing at the origin of the left
internal iliac artery.

Proximal Outflow: Atherosclerotic changes with gross patency of the
bilateral femoral arteries visualized in the study.

Review of the MIP images confirms the above findings.

NON-VASCULAR

Lower chest: No acute abnormality.

Hepatobiliary: Diffuse hepatic steatosis.  Cholelithiasis.

Pancreas: Unremarkable

Spleen: Splenule.  Otherwise unremarkable.

Adrenals/Urinary Tract: Bilateral renal cysts. Adrenal glands are
unremarkable. Stable diffuse bladder wall thickening with areas of
relative sparing.

Stomach/Bowel: Sigmoid diverticulosis. No obvious colon mass. Normal
appendix. No evidence of small-bowel obstruction. Stomach and
duodenum are within normal limits.

Lymphatic: No abnormal retroperitoneal adenopathy.

Reproductive: The prostate extends through the base of the bladder.
There is heterogeneity with low-density centrally

Other: No free fluid.

Musculoskeletal: No vertebral compression deformity.
IMPRESSION: VASCULAR

Abdominal aortic aneurysm now measures 5.5 x 5.4 cm compared with
4.8 x 4.5 cm. Recommend followup by abdomen and pelvis CTA in 3-6
months, and vascular surgery referral/consultation if not already
obtained. This recommendation follows ACR consensus guidelines:
White Paper of the ACR Incidental Findings Committee II on Vascular
Findings. [HOSPITAL] 5424; [DATE].

Right and left common iliac artery aneurysms measure 2.6 and 2.7 cm
compared with 2.2 and 2.4 cm respectively on the prior study.

Bilateral renal artery stenosis as described above. This includes
the dominant right upper renal artery common a dominant left upper
renal artery, and accessory smaller left lower renal artery. There
has been progression since the prior study.

Significant narrowing at the origins of the bilateral internal iliac
arteries.

Stable IMA origin occlusion.

NON-VASCULAR

Cholelithiasis.

Heterogeneous prostate.  Correlate with physical exam and PSA.

## 2019-05-05 ENCOUNTER — Other Ambulatory Visit: Payer: Self-pay

## 2019-05-05 ENCOUNTER — Inpatient Hospital Stay (HOSPITAL_BASED_OUTPATIENT_CLINIC_OR_DEPARTMENT_OTHER)
Admission: EM | Admit: 2019-05-05 | Discharge: 2019-05-08 | DRG: 872 | Disposition: A | Discharge status: Home / Self Care | Payer: BC Managed Care – PPO | Attending: Internal Medicine | Admitting: Internal Medicine

## 2019-05-05 ENCOUNTER — Emergency Department (HOSPITAL_BASED_OUTPATIENT_CLINIC_OR_DEPARTMENT_OTHER): Payer: BC Managed Care – PPO

## 2019-05-05 ENCOUNTER — Encounter (HOSPITAL_BASED_OUTPATIENT_CLINIC_OR_DEPARTMENT_OTHER): Payer: Self-pay | Admitting: *Deleted

## 2019-05-05 DIAGNOSIS — J45909 Unspecified asthma, uncomplicated: Secondary | ICD-10-CM | POA: Diagnosis present

## 2019-05-05 DIAGNOSIS — Z79891 Long term (current) use of opiate analgesic: Secondary | ICD-10-CM

## 2019-05-05 DIAGNOSIS — Z79899 Other long term (current) drug therapy: Secondary | ICD-10-CM

## 2019-05-05 DIAGNOSIS — J439 Emphysema, unspecified: Secondary | ICD-10-CM | POA: Diagnosis not present

## 2019-05-05 DIAGNOSIS — Z20822 Contact with and (suspected) exposure to covid-19: Secondary | ICD-10-CM | POA: Diagnosis not present

## 2019-05-05 DIAGNOSIS — Z8679 Personal history of other diseases of the circulatory system: Secondary | ICD-10-CM

## 2019-05-05 DIAGNOSIS — Z7982 Long term (current) use of aspirin: Secondary | ICD-10-CM

## 2019-05-05 DIAGNOSIS — A419 Sepsis, unspecified organism: Secondary | ICD-10-CM | POA: Diagnosis not present

## 2019-05-05 DIAGNOSIS — I129 Hypertensive chronic kidney disease with stage 1 through stage 4 chronic kidney disease, or unspecified chronic kidney disease: Secondary | ICD-10-CM | POA: Diagnosis not present

## 2019-05-05 DIAGNOSIS — K219 Gastro-esophageal reflux disease without esophagitis: Secondary | ICD-10-CM | POA: Diagnosis not present

## 2019-05-05 DIAGNOSIS — Z86010 Personal history of colonic polyps: Secondary | ICD-10-CM

## 2019-05-05 DIAGNOSIS — I714 Abdominal aortic aneurysm, without rupture: Secondary | ICD-10-CM | POA: Diagnosis present

## 2019-05-05 DIAGNOSIS — I739 Peripheral vascular disease, unspecified: Secondary | ICD-10-CM | POA: Diagnosis not present

## 2019-05-05 DIAGNOSIS — Z86718 Personal history of other venous thrombosis and embolism: Secondary | ICD-10-CM

## 2019-05-05 DIAGNOSIS — Z85828 Personal history of other malignant neoplasm of skin: Secondary | ICD-10-CM | POA: Diagnosis not present

## 2019-05-05 DIAGNOSIS — Z888 Allergy status to other drugs, medicaments and biological substances status: Secondary | ICD-10-CM

## 2019-05-05 DIAGNOSIS — Z8249 Family history of ischemic heart disease and other diseases of the circulatory system: Secondary | ICD-10-CM

## 2019-05-05 DIAGNOSIS — Z95820 Peripheral vascular angioplasty status with implants and grafts: Secondary | ICD-10-CM

## 2019-05-05 DIAGNOSIS — F17210 Nicotine dependence, cigarettes, uncomplicated: Secondary | ICD-10-CM | POA: Diagnosis present

## 2019-05-05 DIAGNOSIS — T502X5A Adverse effect of carbonic-anhydrase inhibitors, benzothiadiazides and other diuretics, initial encounter: Secondary | ICD-10-CM | POA: Diagnosis not present

## 2019-05-05 DIAGNOSIS — Z882 Allergy status to sulfonamides status: Secondary | ICD-10-CM

## 2019-05-05 DIAGNOSIS — L03211 Cellulitis of face: Secondary | ICD-10-CM | POA: Diagnosis present

## 2019-05-05 DIAGNOSIS — Z8349 Family history of other endocrine, nutritional and metabolic diseases: Secondary | ICD-10-CM

## 2019-05-05 DIAGNOSIS — N1831 Chronic kidney disease, stage 3a: Secondary | ICD-10-CM | POA: Diagnosis present

## 2019-05-05 DIAGNOSIS — E785 Hyperlipidemia, unspecified: Secondary | ICD-10-CM | POA: Diagnosis present

## 2019-05-05 DIAGNOSIS — N400 Enlarged prostate without lower urinary tract symptoms: Secondary | ICD-10-CM | POA: Diagnosis present

## 2019-05-05 DIAGNOSIS — Z885 Allergy status to narcotic agent status: Secondary | ICD-10-CM

## 2019-05-05 DIAGNOSIS — N179 Acute kidney failure, unspecified: Secondary | ICD-10-CM | POA: Diagnosis not present

## 2019-05-05 DIAGNOSIS — K029 Dental caries, unspecified: Secondary | ICD-10-CM | POA: Diagnosis not present

## 2019-05-05 DIAGNOSIS — R55 Syncope and collapse: Secondary | ICD-10-CM | POA: Diagnosis not present

## 2019-05-05 DIAGNOSIS — K047 Periapical abscess without sinus: Secondary | ICD-10-CM | POA: Diagnosis not present

## 2019-05-05 DIAGNOSIS — E871 Hypo-osmolality and hyponatremia: Secondary | ICD-10-CM | POA: Diagnosis present

## 2019-05-05 DIAGNOSIS — F419 Anxiety disorder, unspecified: Secondary | ICD-10-CM | POA: Diagnosis present

## 2019-05-05 DIAGNOSIS — R0602 Shortness of breath: Secondary | ICD-10-CM | POA: Diagnosis not present

## 2019-05-05 HISTORY — DX: Sepsis, unspecified organism: A41.9

## 2019-05-05 LAB — HEPATIC FUNCTION PANEL
ALT: 20 U/L (ref 0–44)
AST: 22 U/L (ref 15–41)
Albumin: 4.4 g/dL (ref 3.5–5.0)
Alkaline Phosphatase: 44 U/L (ref 38–126)
Bilirubin, Direct: 0.2 mg/dL (ref 0.0–0.2)
Indirect Bilirubin: 0.6 mg/dL (ref 0.3–0.9)
Total Bilirubin: 0.8 mg/dL (ref 0.3–1.2)
Total Protein: 8.7 g/dL — ABNORMAL HIGH (ref 6.5–8.1)

## 2019-05-05 LAB — CBC WITH DIFFERENTIAL/PLATELET
Abs Immature Granulocytes: 0.06 10*3/uL (ref 0.00–0.07)
Basophils Absolute: 0 10*3/uL (ref 0.0–0.1)
Basophils Relative: 0 %
Eosinophils Absolute: 0 10*3/uL (ref 0.0–0.5)
Eosinophils Relative: 0 %
HCT: 49.8 % (ref 39.0–52.0)
Hemoglobin: 16.9 g/dL (ref 13.0–17.0)
Immature Granulocytes: 0 %
Lymphocytes Relative: 8 %
Lymphs Abs: 1.1 10*3/uL (ref 0.7–4.0)
MCH: 30.9 pg (ref 26.0–34.0)
MCHC: 33.9 g/dL (ref 30.0–36.0)
MCV: 91 fL (ref 80.0–100.0)
Monocytes Absolute: 2.1 10*3/uL — ABNORMAL HIGH (ref 0.1–1.0)
Monocytes Relative: 14 %
Neutro Abs: 11.2 10*3/uL — ABNORMAL HIGH (ref 1.7–7.7)
Neutrophils Relative %: 78 %
Platelets: 199 10*3/uL (ref 150–400)
RBC: 5.47 MIL/uL (ref 4.22–5.81)
RDW: 13.1 % (ref 11.5–15.5)
WBC: 14.5 10*3/uL — ABNORMAL HIGH (ref 4.0–10.5)
nRBC: 0 % (ref 0.0–0.2)

## 2019-05-05 LAB — URINALYSIS, ROUTINE W REFLEX MICROSCOPIC
Bilirubin Urine: NEGATIVE
Glucose, UA: NEGATIVE mg/dL
Ketones, ur: NEGATIVE mg/dL
Leukocytes,Ua: NEGATIVE
Nitrite: NEGATIVE
Protein, ur: NEGATIVE mg/dL
Specific Gravity, Urine: 1.01 (ref 1.005–1.030)
pH: 6.5 (ref 5.0–8.0)

## 2019-05-05 LAB — BASIC METABOLIC PANEL
Anion gap: 13 (ref 5–15)
BUN: 20 mg/dL (ref 8–23)
CO2: 23 mmol/L (ref 22–32)
Calcium: 9.5 mg/dL (ref 8.9–10.3)
Chloride: 89 mmol/L — ABNORMAL LOW (ref 98–111)
Creatinine, Ser: 1.86 mg/dL — ABNORMAL HIGH (ref 0.61–1.24)
GFR calc Af Amer: 44 mL/min — ABNORMAL LOW (ref >60)
GFR calc non Af Amer: 38 mL/min — ABNORMAL LOW (ref >60)
Glucose, Bld: 115 mg/dL — ABNORMAL HIGH (ref 70–99)
Potassium: 3.5 mmol/L (ref 3.5–5.1)
Sodium: 125 mmol/L — ABNORMAL LOW (ref 135–145)

## 2019-05-05 LAB — URINALYSIS, MICROSCOPIC (REFLEX)

## 2019-05-05 LAB — PROTIME-INR
INR: 1 (ref 0.8–1.2)
Prothrombin Time: 13.5 seconds (ref 11.4–15.2)

## 2019-05-05 LAB — SARS CORONAVIRUS 2 AG (30 MIN TAT): SARS Coronavirus 2 Ag: NEGATIVE

## 2019-05-05 LAB — RESPIRATORY PANEL BY RT PCR (FLU A&B, COVID)
Influenza A by PCR: NEGATIVE
Influenza B by PCR: NEGATIVE
SARS Coronavirus 2 by RT PCR: NEGATIVE

## 2019-05-05 LAB — LACTIC ACID, PLASMA: Lactic Acid, Venous: 1.3 mmol/L (ref 0.5–1.9)

## 2019-05-05 LAB — APTT: aPTT: 45 seconds — ABNORMAL HIGH (ref 24–36)

## 2019-05-05 MED ORDER — SODIUM CHLORIDE 0.9 % IV SOLN
INTRAVENOUS | Status: DC | PRN
  Administered 2019-05-05 (×2): 250 mL via INTRAVENOUS

## 2019-05-05 MED ORDER — ACETAMINOPHEN 325 MG PO TABS
650.0000 mg | ORAL_TABLET | Freq: Once | ORAL | Status: AC
  Administered 2019-05-05: 20:00:00 650 mg via ORAL
  Filled 2019-05-05: qty 2

## 2019-05-05 MED ORDER — IOHEXOL 300 MG/ML  SOLN
100.0000 mL | Freq: Once | INTRAMUSCULAR | Status: AC | PRN
  Administered 2019-05-05: 19:00:00 75 mL via INTRAVENOUS

## 2019-05-05 MED ORDER — ALPRAZOLAM 0.5 MG PO TABS
0.5000 mg | ORAL_TABLET | Freq: Once | ORAL | Status: AC
  Administered 2019-05-05: 20:00:00 0.5 mg via ORAL
  Filled 2019-05-05: qty 1

## 2019-05-05 MED ORDER — SODIUM CHLORIDE 0.9 % IV SOLN
2.0000 g | Freq: Two times a day (BID) | INTRAVENOUS | Status: DC
  Administered 2019-05-05: 20:00:00 2 g via INTRAVENOUS
  Filled 2019-05-05 (×3): qty 2

## 2019-05-05 MED ORDER — CLINDAMYCIN PHOSPHATE 600 MG/50ML IV SOLN
600.0000 mg | Freq: Once | INTRAVENOUS | Status: AC
  Administered 2019-05-05: 17:00:00 600 mg via INTRAVENOUS
  Filled 2019-05-05: qty 50

## 2019-05-05 MED ORDER — LACTATED RINGERS IV BOLUS
1000.0000 mL | Freq: Once | INTRAVENOUS | Status: AC
  Administered 2019-05-05: 1000 mL via INTRAVENOUS

## 2019-05-05 MED ORDER — TETANUS-DIPHTH-ACELL PERTUSSIS 5-2.5-18.5 LF-MCG/0.5 IM SUSP
0.5000 mL | Freq: Once | INTRAMUSCULAR | Status: AC
  Administered 2019-05-05: 17:00:00 0.5 mL via INTRAMUSCULAR
  Filled 2019-05-05: qty 0.5

## 2019-05-05 NOTE — ED Triage Notes (Signed)
O2 sats 95%, HR 105 by nursefirst

## 2019-05-05 NOTE — ED Notes (Signed)
Patient transported to CT 

## 2019-05-05 NOTE — ED Notes (Signed)
Pt reports feeling dizzy yesterday afternoon that pt r/t dental pain and swelling.

## 2019-05-05 NOTE — ED Provider Notes (Signed)
[facility] EMERGENCY DEPARTMENT Provider Note   CSN: [id number] Arrival date & time: 05/05/19  1509     History Chief Complaint  Patient presents with  . Facial Swelling  . Near Syncope    [name] is a 64 y.o. male with a past medical history of AAA, DVT, hypertension, seizures, cigarette smoking, who presents today for evaluation multiple complaints.  Dental pain: He reports that only left upper side he has had worsening dental pain since Friday.  He reports that he started having facial swelling on Saturday.  He states that yesterday he was coming back from the bathroom and got lightheaded, started sweating really bad and had a near syncopal event.  He did not strike his head or have a full syncopal event. He reports that he has vomited twice yesterday. He states that he is short of breath, however says that that is from his asthma.  He denies any leg swelling. He has had fevers today per his report of 100.  He states he took Tylenol shortly prior to arrival.  He has not taking any antibiotics currently.  He has not seen a dentist for this yet. He is unsure when his last tetanus shot was.    HPI     Past Medical History:  Diagnosis Date  . AAA (abdominal aortic aneurysm) ([place])   . AAA (abdominal aortic aneurysm) without rupture ([place])   . Arthritis   . BPH (benign prostatic hyperplasia)   . Claudication [facility])    lower extremities  . Claustrophobia    panic attacks  . COLONIC POLYPS, HX OF 05/02/2007  . DVT (deep venous thrombosis) ([place])   . Dyspnea    with exertion  . GERD 11/08/2006  . HYPERCHOLESTEROLEMIA, SEVERE 05/02/2007  . HYPERTENSION 11/08/2006  . LOW BACK PAIN 11/22/2007  . 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 PONV (postoperative nausea and vomiting)   . Seizures ([place])    from concussion several months ago has been having headache  . Weak urine stream    Slow to get started    Patient Active Problem List   Diagnosis Date Noted    . Sepsis ([place]) 05/05/2019  . Pulmonary emphysema ([place]) 03/11/2019  . Grieving 07/27/2018  . Anxiety disorder due to general medical condition 07/27/2018  . Reactive airway disease 07/27/2018  . Elevated PSA, less than 10 ng/ml 01/17/2018  . Muscle cramps 01/17/2018  . CKD (chronic kidney disease) stage 3, GFR 30-59 ml/min 01/17/2018  . Screen for colon cancer 01/16/2018  . Chronic kidney disease (CKD) stage G2/A2, mildly decreased glomerular filtration rate (GFR) between 60-89 mL/min/1.73 square meter and albuminuria creatinine ratio between 30-299 mg/g 08/16/2017  . AAA (abdominal aortic aneurysm) ([place]) 07/17/2017  . Mixed dyslipidemia 06/01/2017  . Cigarette smoker 06/01/2017  . BPH (benign prostatic hyperplasia) 09/22/2015  . Abdominal aortic aneurysm ([place]) 06/12/2013  . Abdominal pain, other specified site 05/22/2012  . Peripheral vascular disease, unspecified ([place]) 04/26/2011  . LOW BACK PAIN 11/22/2007  . 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 COLONIC POLYPS, HX OF 05/02/2007  . Essential hypertension 11/08/2006  . GERD 11/08/2006    Past Surgical History:  Procedure Laterality Date  . ABDOMINAL AORTIC ENDOVASCULAR STENT GRAFT N/A 07/17/2017   Procedure: ABDOMINAL AORTIC ENDOVASCULAR STENT GRAFT REPAIR WITH ILIAC BRANCH DEVICE;  Surgeon: [name], MD;  Location: [facility] OR;  Service: Vascular;  Laterality: N/A;  . ANGIOPLASTY  09/20/10   Rt AK to tibioperoneal trunk BPG w/  vein patch angioplasty of tibioperoneal trunk   . carpel tunnel surgery  per pt. 6-7 years ago   bilateral  (done 2 weeks apart)  . FOOT SURGERY     right x2  . MOHS SURGERY     lower lip  . PR VEIN BYPASS GRAFT,AORTO-FEM-POP         Family History  Problem Relation Age of Onset  . Adrenal disorder Brother   . Hyperlipidemia Brother   . Hypertension Brother   . Heart disease Brother        Heart Disease before age 12  . Heart attack Brother   . Heart disease Mother        Heart Disease  before age 70  . Hyperlipidemia Mother   . Hypertension Mother   . Heart attack Mother   . Heart disease Father        Heart Disease before age 31  . Hyperlipidemia Father   . Heart attack Father   . Pulmonary embolism Sister     Social History   Tobacco Use  . Smoking status: Current Every Day Smoker    Packs/day: 1.00    Years: 40.00    Pack years: 40.00    Types: Cigarettes  . Smokeless tobacco: Never Used  Substance Use Topics  . Alcohol use: No    Alcohol/week: 0.0 standard drinks  . Drug use: No    Home Medications Prior to Admission medications   Medication Sig Start Date End Date Taking? Authorizing Provider  hydrochlorothiazide (HYDRODIURIL) 25 MG tablet TAKE 1 TABLET BY MOUTH EVERY DAY 03/12/19   Libby Maw, MD  albuterol (VENTOLIN HFA) 108 (90 Base) MCG/ACT inhaler Inhale 1 puff into the lungs every 6 (six) hours as needed for wheezing or shortness of breath. 04/24/19   Libby Maw, MD  ALPRAZolam Duanne Moron) 0.5 MG tablet TAKE 1 TABLET BY MOUTH TWICE A DAY AS NEEDED FOR ANXIETY 04/22/19   Libby Maw, MD  amLODipine (NORVASC) 10 MG tablet TAKE 1 TABLET BY MOUTH EVERY DAY 04/22/19   Libby Maw, MD  aspirin 81 MG tablet Take 1 tablet (81 mg total) by mouth daily. Patient not taking: Reported on 03/11/2019 06/18/18   Libby Maw, MD  ASPIRIN LOW DOSE 81 MG EC tablet TAKE 1 TABLET BY MOUTH EVERY DAY 12/28/18   Libby Maw, MD  benazepril (LOTENSIN) 40 MG tablet TAKE 1 TABLET BY MOUTH EVERY DAY 12/11/18   Libby Maw, MD  Multiple Vitamin (MULTIVITAMIN) tablet Take 1 tablet by mouth daily.      [provider]  omeprazole (PRILOSEC) 20 MG capsule TAKE 1 CAPSULE BY MOUTH EVERY DAY 12/11/18   Libby Maw, MD  pravastatin (PRAVACHOL) 40 MG tablet TAKE 2 TABLETS BY MOUTH EVERY DAY 12/28/18   Libby Maw, MD  SPIRIVA HANDIHALER 18 MCG inhalation capsule INHALE 1 CAPSULE VIA  HANDIHALER ONCE DAILY AT THE SAME TIME EVERY DAY 04/05/19   Libby Maw, MD  tamsulosin (FLOMAX) 0.4 MG CAPS capsule TAKE 1 CAPSULE BY MOUTH EVERY DAY 04/22/19   Libby Maw, MD  traMADol (ULTRAM) 50 MG tablet Take 1 tablet (50 mg total) by mouth every 6 (six) hours as needed. 08/16/17   Marletta Lor, MD    Allergies    Cortisone, Fentanyl, and Sulfonamide derivatives  Review of Systems   Review of Systems  Constitutional: Positive for chills, fatigue and fever.  HENT: Positive for dental problem and facial  swelling. Negative for drooling, ear discharge, postnasal drip, rhinorrhea, sinus pressure, sinus pain and sore throat.   Eyes: Negative for visual disturbance.  Respiratory: Positive for cough and shortness of breath.   Cardiovascular: Negative for chest pain.  Gastrointestinal: Positive for nausea and vomiting. Negative for abdominal pain, constipation and diarrhea.  Genitourinary: Negative for dysuria.  Musculoskeletal: Negative for back pain and neck pain.  Skin: Negative for color change and rash.  Neurological: Positive for light-headedness. Negative for syncope, weakness and headaches.  Psychiatric/Behavioral: Negative for confusion.  All other systems reviewed and are negative.   Physical Exam Updated Vital Signs BP (!) 160/88   Pulse (!) 112   Temp 99.8 F (37.7 C) (Oral)   Resp 18   Ht 6' (1.829 m)   Wt 105.9 kg   SpO2 97%   BMI 31.66 kg/m   Physical Exam Vitals and nursing note reviewed.  Constitutional:      General: He is not in acute distress.    Appearance: He is well-developed. He is not diaphoretic.  HENT:     Head:     Comments: There is edema of the left sided face including under the left eye, the left cheek and the left upper lip.      Mouth/Throat:     Comments: Generally poor state of dentition.  There is a carious and decaying tooth in the left sided mandibular area with out significant intraoral edema.  Uvula is not  deviated.  No elevation of the floor of the mouth.  Eyes:     General: No scleral icterus.       Right eye: No discharge.        Left eye: No discharge.     Conjunctiva/sclera: Conjunctivae normal.  Cardiovascular:     Rate and Rhythm: Regular rhythm. Tachycardia present.     Pulses: Normal pulses.     Heart sounds: Normal heart sounds.  Pulmonary:     Effort: Pulmonary effort is normal. No respiratory distress.     Breath sounds: No stridor. No wheezing.  Abdominal:     General: There is no distension.     Tenderness: There is no abdominal tenderness.  Genitourinary:    Comments: Deferred Musculoskeletal:        General: No deformity.     Cervical back: Normal range of motion and neck supple.  Skin:    General: Skin is warm and dry.  Neurological:     Mental Status: He is alert and oriented to person, place, and time.     Motor: No abnormal muscle tone.  Psychiatric:        Behavior: Behavior normal.     ED Results / Procedures / Treatments   Labs (all labs ordered are listed, but only abnormal results are displayed) Labs Reviewed  CBC WITH DIFFERENTIAL/PLATELET - Abnormal; Notable for the following components:      Result Value   WBC 14.5 (*)    Neutro Abs 11.2 (*)    Monocytes Absolute 2.1 (*)    All other components within normal limits  BASIC METABOLIC PANEL - Abnormal; Notable for the following components:   Sodium 125 (*)    Chloride 89 (*)    Glucose, Bld 115 (*)    Creatinine, Ser 1.86 (*)    GFR calc non Af Amer 38 (*)    GFR calc Af Amer 44 (*)    All other components within normal limits  HEPATIC FUNCTION PANEL - Abnormal; Notable for  the following components:   Total Protein 8.7 (*)    All other components within normal limits  URINALYSIS, ROUTINE W REFLEX MICROSCOPIC - Abnormal; Notable for the following components:   Hgb urine dipstick TRACE (*)    All other components within normal limits  APTT - Abnormal; Notable for the following components:    aPTT 45 (*)    All other components within normal limits  URINALYSIS, MICROSCOPIC (REFLEX) - Abnormal; Notable for the following components:   Bacteria, UA FEW (*)    All other components within normal limits  CULTURE, BLOOD (ROUTINE X 2)  SARS CORONAVIRUS 2 AG (30 MIN TAT)  RESPIRATORY PANEL BY RT PCR (FLU A&B, COVID)  CULTURE, BLOOD (ROUTINE X 2)  CULTURE, BLOOD (ROUTINE X 2)  CULTURE, BLOOD (ROUTINE X 2)  URINE CULTURE  LACTIC ACID, PLASMA  PROTIME-INR    EKG EKG Interpretation  Date/Time:  Sunday May 05 2019 15:28:12 EST Ventricular Rate:  106 PR Interval:  142 QRS Duration: 88 QT Interval:  338 QTC Calculation: 448 R Axis:   67 Text Interpretation: Sinus tachycardia Cannot rule out Anterior infarct , age undetermined Abnormal ECG Confirmed by Lennice Sites [phone]) on 05/05/2019 4:52:43 PM   Radiology CT Maxillofacial W Contrast  Result Date: 05/05/2019 CLINICAL DATA:  64 year old male with suspected dental infection, left upper front tooth. EXAM: CT MAXILLOFACIAL WITH CONTRAST TECHNIQUE: Multidetector CT imaging of the maxillofacial structures was performed with intravenous contrast. Multiplanar CT image reconstructions were also generated. CONTRAST:  86mL OMNIPAQUE IOHEXOL 300 MG/ML  SOLN COMPARISON:  Head CT 08/31/2016. FINDINGS: Osseous: Intact mandible. Multiple areas of absent dentition bilaterally. A left maxillary residual canine or anterior bicuspid is noted with mild periapical lucency (series 3, image 47 and series 7, image 28). On the right side there is more severe periapical lucency at the anterior right maxillary molar. Otherwise the maxilla appears intact. Intact zygoma, nasal bones, central skull base. Visible cervical vertebrae also appear intact with degenerative changes. Orbits: Orbital walls intact. Orbits soft tissues appear symmetric and within normal limits. Sinuses: Up to moderate left side paranasal sinus mucosal thickening at the frontoethmoidal  recess, ethmoids and left maxillary sinus. No sinus fluid levels. Contralateral right paranasal sinuses are largely clear. Tympanic cavities and mastoids are clear. Soft tissues: Asymmetric left face soft tissue swelling and stranding including the left buccal space and lateral to the left mandible (series 2, image 76). There does appear to be subperiosteal fluid along the left maxilla alveolar process as seen on series 2, image 54 and series 6, image 34, although this is somewhat posterior to the anterior left maxillary tooth. No soft tissue gas. No other soft tissue fluid is evident. The left sublingual and submandibular spaces are spared. There is mild thickening of the left platysma. The left masticator and parotid spaces also appear spared. Negative visible larynx, pharynx, parapharyngeal and retropharyngeal spaces. No cervical lymphadenopathy. Grossly patent major vascular structures in the neck and at the skull base with moderate to advanced proximal right ICA atherosclerosis. The left ICA siphon is also heavily calcified. Limited intracranial: Negative. IMPRESSION: 1. Left facial cellulitis, possibly odontogenic with mild periapical lucency at the residual anterior left maxillary tooth. 2. There appears to be edema or fluid along the maxilla alveolar process posterior to the tooth, thus gingival or subperiosteal abscess is difficult to exclude. But no other fluid collection or complicating features. 3. There is mild to moderate left side paranasal sinus inflammation although not obviously related to #1.  4. Proximal right ICA atherosclerosis which may be hemodynamically significant. Electronically Signed   By: Genevie Ann M.D.   On: 05/05/2019 20:05   DG Chest Port 1 View  Result Date: 05/05/2019 CLINICAL DATA:  Tachycardia and shortness of breath EXAM: PORTABLE CHEST 1 VIEW COMPARISON:  Chest radiograph dated 07/17/2017 FINDINGS: The heart size and mediastinal contours are within normal limits. Eventration  of the right hemidiaphragm is redemonstrated. Both lungs are clear. The visualized skeletal structures are unremarkable. IMPRESSION: No active disease. Electronically Signed   By: Zerita Boers M.D.   On: 05/05/2019 16:52    Procedures .Critical Care Performed by: Lorin Glass, PA-C Authorized by: Lorin Glass, PA-C   Critical care provider statement:    Critical care time (minutes):  45   Critical care time was exclusive of:  Separately billable procedures and treating other patients and teaching time   Critical care was necessary to treat or prevent imminent or life-threatening deterioration of the following conditions:  Sepsis   Critical care was time spent personally by me on the following activities:  Discussions with consultants, evaluation of patient's response to treatment, examination of patient, ordering and performing treatments and interventions, ordering and review of laboratory studies, ordering and review of radiographic studies, pulse oximetry, re-evaluation of patient's condition, obtaining history from patient or surrogate and review of old charts   (including critical care time)  Medications Ordered in ED Medications  0.9 %  sodium chloride infusion (250 mLs Intravenous New Bag/Given 05/05/19 1947)  ceFEPIme (MAXIPIME) 2 g in sodium chloride 0.9 % 100 mL IVPB (0 g Intravenous Stopped 05/05/19 2154)  clindamycin (CLEOCIN) IVPB 600 mg (0 mg Intravenous Stopped 05/05/19 1812)  Tdap (BOOSTRIX) injection 0.5 mL (0.5 mLs Intramuscular Given 05/05/19 1728)  lactated ringers bolus 1,000 mL (0 mLs Intravenous Stopped 05/05/19 1924)  iohexol (OMNIPAQUE) 300 MG/ML solution 100 mL (75 mLs Intravenous Contrast Given 05/05/19 1923)  ALPRAZolam Duanne Moron) tablet 0.5 mg (0.5 mg Oral Given 05/05/19 2010)  acetaminophen (TYLENOL) tablet 650 mg (650 mg Oral Given 05/05/19 2011)    ED Course  I have reviewed the triage vital signs and the nursing notes.  Pertinent labs & imaging  results that were available during my care of the patient were reviewed by me and considered in my medical decision making (see chart for details).  Clinical Course as of May 04 2237  Nancy Fetter May 05, 2019  1705 Sodium(!): 125 [EH]  1705 WBC(!): 14.5 [EH]  1839 Patient requested home dose of xanax.  This was ordered.    [EH]  2038 I spoke with Dr. Lara Mulch who requests that consider other facilities given oral surgery is not avaliable at [facility] for the next 36 hours.    [EH]  2041 [facility] does not have dental or oral surgery.    [EH]  2044 Attempted to call dentist on call.  No answer.     [EH]  2128 [facility] does not have dentist or oral surgery.     [EH]  2136 Spoke with Dr. Hal Hope who requests I try [facility].     [EH]  2207 I spoke with oral surgery at [facility] who states that patient does not sound like an oral surgery case, is a dental concerns.    [EH]  2211 Spoke with Dr. Hal Hope who will take patient for admission.   [EH]    Clinical Course User Index [EH] Ollen Gross   MDM Rules/Calculators/A&P  Patient presents today for evaluation of facial swelling, near syncope, fevers at home and dental pain. On exam he has left-sided facial swelling.  He has multiple teeth in various states of decay.  There is no visualized intraoral abscess.  No elevation of the floor of the mouth.  He is managing his secretions well. He was found to be tachycardic between 100-110, reportedly febrile at home over 100 and has a white count of 14.5. Code sepsis was called.  Given suspected source he was initially started on clindamycin, however this was expanded to include cefepime. Blood cultures were obtained and sent.  Urine culture was obtained however this was after patient had started antibiotics. In addition labs are significant for hyponatremia at 125, his creatinine is at his baseline at 1.86 however his GFR is decreased at 38.  Hepatic function  panel is normal.  Lactic acid is not elevated over 4 and he is not hypotensive therefore he is not given 30/kg fluid bolus.  Chest x-ray was obtained without evidence of consolidation pneumothorax or other acute abnormalities.  Rapid Covid test and respiratory panel by PCR for Covid and influenza AMB was both negative.  Patient had reportedly had multiple near syncopal episodes at home mostly yesterday.  CT scan of the face was obtained showing a left-sided small possible abscess, along with general dental disease, left-sided facial cellulitis, and possibly right ICA arteriosclerosis.  atempted to call on-call dentist, however as it was after hours there was no answer.  Given patient's combination of infection, cellulitis, hyponatremia at 125, multiple near syncopal episodes at home, along with his general condition I am concerned that he would deteriorate if discharged home with only p.o. antibiotics at this time.  Attempted calls to dentist on-call again without answer. I spoke with Dr. Hal Hope who requested that I reach out to other facilities to evaluate for possibility of oral surgeon. [facility], do not have Chief Financial Officer on call. I spoke with oral surgeon at [facility] who states that this does not appear to be a oral surgery issue is more of a dental issue and declined transfer. I again spoke with hospitalist Dr. Hal Hope who accepts the patient for admission.  I feel that this is appropriate given that dental is on-call tomorrow and will most likely be reachable during the day in addition to, if needed, oral surgery being available in approximately 36 hours.  Return precautions were discussed with patient who states their understanding.  At the time of discharge patient denied any unaddressed complaints or concerns.  Patient is agreeable for discharge home.  Note: Portions of this report may have been transcribed using voice recognition software. Every effort was made to  ensure accuracy; however, inadvertent computerized transcription errors may be present  Final Clinical Impression(s) / ED Diagnoses Final diagnoses:  Dental infection  Near syncope  Sepsis without acute organ dysfunction, due to unspecified organism [facility])  Hyponatremia    Rx / DC Orders ED Discharge Orders    None       Lorin Glass, PA-C 05/05/19 2245    Lennice Sites, DO 05/05/19 2320

## 2019-05-05 NOTE — ED Notes (Signed)
ED Provider at bedside. 

## 2019-05-05 NOTE — ED Triage Notes (Signed)
Pt reports right upper tooth pain Friday. Facial swelling Saturday. States he "passed out" yesterday when he was coming back from the bathroom. States he "got lightheaded and started sweating real bad". Reports fever and SOB but states "I'm always short of breath from asthma"

## 2019-05-05 NOTE — Progress Notes (Addendum)
Notified provider and bedside nurse of need to order antibiotics.  Clindamycin was the only abx ordered, and it appears this alone doesn't meet the sepsis bundle. I have asked the MD to consider adding a second abx.   Provider replied With what I have right now I don't have any indication that the clinda will not cover his suspected source of infection If pharmacy feels like clinda leaves a gap in coverage from a dental infection I am more than happy to change it  I replied that I will follow up with pharmacy. Pharmacy input was to add cefepime so this will meet the sepsis bundle.

## 2019-05-06 ENCOUNTER — Encounter (HOSPITAL_COMMUNITY): Payer: Self-pay | Admitting: Internal Medicine

## 2019-05-06 DIAGNOSIS — K047 Periapical abscess without sinus: Secondary | ICD-10-CM

## 2019-05-06 DIAGNOSIS — R55 Syncope and collapse: Secondary | ICD-10-CM | POA: Diagnosis not present

## 2019-05-06 DIAGNOSIS — E871 Hypo-osmolality and hyponatremia: Secondary | ICD-10-CM | POA: Diagnosis present

## 2019-05-06 DIAGNOSIS — A419 Sepsis, unspecified organism: Secondary | ICD-10-CM | POA: Diagnosis not present

## 2019-05-06 DIAGNOSIS — L03211 Cellulitis of face: Secondary | ICD-10-CM

## 2019-05-06 LAB — MRSA PCR SCREENING: MRSA by PCR: NEGATIVE

## 2019-05-06 LAB — BASIC METABOLIC PANEL
Anion gap: 11 (ref 5–15)
Anion gap: 12 (ref 5–15)
Anion gap: 14 (ref 5–15)
Anion gap: 13 (ref 5–15)
Anion gap: 14 (ref 5–15)
Anion gap: 13 (ref 5–15)
BUN: 21 mg/dL (ref 8–23)
BUN: 21 mg/dL (ref 8–23)
BUN: 20 mg/dL (ref 8–23)
BUN: 23 mg/dL (ref 8–23)
BUN: 24 mg/dL — ABNORMAL HIGH (ref 8–23)
BUN: 25 mg/dL — ABNORMAL HIGH (ref 8–23)
CO2: 25 mmol/L (ref 22–32)
CO2: 23 mmol/L (ref 22–32)
CO2: 21 mmol/L — ABNORMAL LOW (ref 22–32)
CO2: 22 mmol/L (ref 22–32)
CO2: 22 mmol/L (ref 22–32)
CO2: 23 mmol/L (ref 22–32)
Calcium: 9.1 mg/dL (ref 8.9–10.3)
Calcium: 9 mg/dL (ref 8.9–10.3)
Calcium: 9.2 mg/dL (ref 8.9–10.3)
Calcium: 9.3 mg/dL (ref 8.9–10.3)
Calcium: 9.3 mg/dL (ref 8.9–10.3)
Calcium: 9.1 mg/dL (ref 8.9–10.3)
Chloride: 95 mmol/L — ABNORMAL LOW (ref 98–111)
Chloride: 94 mmol/L — ABNORMAL LOW (ref 98–111)
Chloride: 94 mmol/L — ABNORMAL LOW (ref 98–111)
Chloride: 94 mmol/L — ABNORMAL LOW (ref 98–111)
Chloride: 94 mmol/L — ABNORMAL LOW (ref 98–111)
Chloride: 94 mmol/L — ABNORMAL LOW (ref 98–111)
Creatinine, Ser: 1.74 mg/dL — ABNORMAL HIGH (ref 0.61–1.24)
Creatinine, Ser: 1.63 mg/dL — ABNORMAL HIGH (ref 0.61–1.24)
Creatinine, Ser: 1.64 mg/dL — ABNORMAL HIGH (ref 0.61–1.24)
Creatinine, Ser: 1.63 mg/dL — ABNORMAL HIGH (ref 0.61–1.24)
Creatinine, Ser: 1.63 mg/dL — ABNORMAL HIGH (ref 0.61–1.24)
Creatinine, Ser: 1.79 mg/dL — ABNORMAL HIGH (ref 0.61–1.24)
GFR calc Af Amer: 47 mL/min — ABNORMAL LOW (ref >60)
GFR calc Af Amer: 51 mL/min — ABNORMAL LOW (ref >60)
GFR calc Af Amer: 51 mL/min — ABNORMAL LOW (ref >60)
GFR calc Af Amer: 51 mL/min — ABNORMAL LOW (ref >60)
GFR calc Af Amer: 51 mL/min — ABNORMAL LOW (ref >60)
GFR calc Af Amer: 46 mL/min — ABNORMAL LOW (ref >60)
GFR calc non Af Amer: 41 mL/min — ABNORMAL LOW (ref >60)
GFR calc non Af Amer: 44 mL/min — ABNORMAL LOW (ref >60)
GFR calc non Af Amer: 44 mL/min — ABNORMAL LOW (ref >60)
GFR calc non Af Amer: 44 mL/min — ABNORMAL LOW (ref >60)
GFR calc non Af Amer: 44 mL/min — ABNORMAL LOW (ref >60)
GFR calc non Af Amer: 39 mL/min — ABNORMAL LOW (ref >60)
Glucose, Bld: 98 mg/dL (ref 70–99)
Glucose, Bld: 95 mg/dL (ref 70–99)
Glucose, Bld: 98 mg/dL (ref 70–99)
Glucose, Bld: 92 mg/dL (ref 70–99)
Glucose, Bld: 87 mg/dL (ref 70–99)
Glucose, Bld: 83 mg/dL (ref 70–99)
Potassium: 3.7 mmol/L (ref 3.5–5.1)
Potassium: 3.7 mmol/L (ref 3.5–5.1)
Potassium: 3.8 mmol/L (ref 3.5–5.1)
Potassium: 3.9 mmol/L (ref 3.5–5.1)
Potassium: 3.7 mmol/L (ref 3.5–5.1)
Potassium: 3.5 mmol/L (ref 3.5–5.1)
Sodium: 131 mmol/L — ABNORMAL LOW (ref 135–145)
Sodium: 129 mmol/L — ABNORMAL LOW (ref 135–145)
Sodium: 129 mmol/L — ABNORMAL LOW (ref 135–145)
Sodium: 129 mmol/L — ABNORMAL LOW (ref 135–145)
Sodium: 130 mmol/L — ABNORMAL LOW (ref 135–145)
Sodium: 130 mmol/L — ABNORMAL LOW (ref 135–145)

## 2019-05-06 LAB — CBC
HCT: 43.8 % (ref 39.0–52.0)
Hemoglobin: 14.9 g/dL (ref 13.0–17.0)
MCH: 31.5 pg (ref 26.0–34.0)
MCHC: 34 g/dL (ref 30.0–36.0)
MCV: 92.6 fL (ref 80.0–100.0)
Platelets: 186 10*3/uL (ref 150–400)
RBC: 4.73 MIL/uL (ref 4.22–5.81)
RDW: 13.2 % (ref 11.5–15.5)
WBC: 12.3 10*3/uL — ABNORMAL HIGH (ref 4.0–10.5)
nRBC: 0 % (ref 0.0–0.2)

## 2019-05-06 LAB — URINE CULTURE: Culture: NO GROWTH

## 2019-05-06 LAB — HIV ANTIBODY (ROUTINE TESTING W REFLEX): HIV Screen 4th Generation wRfx: NONREACTIVE

## 2019-05-06 MED ORDER — UMECLIDINIUM BROMIDE 62.5 MCG/INH IN AEPB
1.0000 | INHALATION_SPRAY | Freq: Every day | RESPIRATORY_TRACT | Status: DC
  Administered 2019-05-06 – 2019-05-08 (×3): 1 via RESPIRATORY_TRACT
  Filled 2019-05-06: qty 7

## 2019-05-06 MED ORDER — ADULT MULTIVITAMIN W/MINERALS CH
1.0000 | ORAL_TABLET | Freq: Every day | ORAL | Status: DC
  Administered 2019-05-06 – 2019-05-08 (×3): 1 via ORAL
  Filled 2019-05-06 (×3): qty 1

## 2019-05-06 MED ORDER — ONDANSETRON HCL 4 MG/2ML IJ SOLN
4.0000 mg | Freq: Four times a day (QID) | INTRAMUSCULAR | Status: DC | PRN
  Filled 2019-05-06: qty 2

## 2019-05-06 MED ORDER — TRAMADOL HCL 50 MG PO TABS
50.0000 mg | ORAL_TABLET | Freq: Four times a day (QID) | ORAL | Status: DC | PRN
  Administered 2019-05-06 – 2019-05-08 (×9): 50 mg via ORAL
  Filled 2019-05-06 (×9): qty 1

## 2019-05-06 MED ORDER — ACETAMINOPHEN 325 MG PO TABS
650.0000 mg | ORAL_TABLET | Freq: Four times a day (QID) | ORAL | Status: DC | PRN
  Administered 2019-05-06 – 2019-05-08 (×7): 650 mg via ORAL
  Filled 2019-05-06 (×7): qty 2

## 2019-05-06 MED ORDER — ACETAMINOPHEN 650 MG RE SUPP: 650.0000 mg | Freq: Four times a day (QID) | RECTAL | Status: DC | PRN

## 2019-05-06 MED ORDER — VANCOMYCIN HCL 2000 MG/400ML IV SOLN
2000.0000 mg | Freq: Once | INTRAVENOUS | Status: AC
  Administered 2019-05-06: 02:00:00 2000 mg via INTRAVENOUS
  Filled 2019-05-06: qty 400

## 2019-05-06 MED ORDER — ALBUTEROL SULFATE (2.5 MG/3ML) 0.083% IN NEBU: 3.0000 mL | INHALATION_SOLUTION | Freq: Four times a day (QID) | RESPIRATORY_TRACT | Status: DC | PRN

## 2019-05-06 MED ORDER — MORPHINE SULFATE (PF) 2 MG/ML IV SOLN
1.0000 mg | INTRAVENOUS | Status: AC | PRN
  Administered 2019-05-06: 02:00:00 1 mg via INTRAVENOUS
  Filled 2019-05-06: qty 1

## 2019-05-06 MED ORDER — PRAVASTATIN SODIUM 40 MG PO TABS
80.0000 mg | ORAL_TABLET | Freq: Every day | ORAL | Status: DC
  Administered 2019-05-06 – 2019-05-07 (×2): 80 mg via ORAL
  Filled 2019-05-06 (×2): qty 2

## 2019-05-06 MED ORDER — VANCOMYCIN HCL 1500 MG/300ML IV SOLN
1500.0000 mg | INTRAVENOUS | Status: DC
  Administered 2019-05-07 – 2019-05-08 (×2): 1500 mg via INTRAVENOUS
  Filled 2019-05-06 (×2): qty 300

## 2019-05-06 MED ORDER — PANTOPRAZOLE SODIUM 40 MG PO TBEC
40.0000 mg | DELAYED_RELEASE_TABLET | Freq: Every day | ORAL | Status: DC
  Administered 2019-05-06 – 2019-05-08 (×3): 40 mg via ORAL
  Filled 2019-05-06 (×3): qty 1

## 2019-05-06 MED ORDER — SODIUM CHLORIDE 0.9 % IV SOLN: INTRAVENOUS | Status: DC

## 2019-05-06 MED ORDER — TAMSULOSIN HCL 0.4 MG PO CAPS
0.4000 mg | ORAL_CAPSULE | Freq: Every day | ORAL | Status: DC
  Administered 2019-05-06 – 2019-05-08 (×3): 0.4 mg via ORAL
  Filled 2019-05-06 (×3): qty 1

## 2019-05-06 MED ORDER — ASPIRIN EC 81 MG PO TBEC
81.0000 mg | DELAYED_RELEASE_TABLET | Freq: Every day | ORAL | Status: DC
  Administered 2019-05-06 – 2019-05-08 (×3): 81 mg via ORAL
  Filled 2019-05-06 (×3): qty 1

## 2019-05-06 MED ORDER — AMLODIPINE BESYLATE 10 MG PO TABS
10.0000 mg | ORAL_TABLET | Freq: Every day | ORAL | Status: DC
  Administered 2019-05-06 – 2019-05-08 (×3): 10 mg via ORAL
  Filled 2019-05-06 (×3): qty 1

## 2019-05-06 MED ORDER — ENOXAPARIN SODIUM 40 MG/0.4ML ~~LOC~~ SOLN
40.0000 mg | SUBCUTANEOUS | Status: DC
  Administered 2019-05-06 – 2019-05-08 (×3): 40 mg via SUBCUTANEOUS
  Filled 2019-05-06 (×3): qty 0.4

## 2019-05-06 MED ORDER — SENNOSIDES-DOCUSATE SODIUM 8.6-50 MG PO TABS: 1.0000 | ORAL_TABLET | Freq: Every evening | ORAL | Status: DC | PRN

## 2019-05-06 MED ORDER — PIPERACILLIN-TAZOBACTAM 3.375 G IVPB
3.3750 g | Freq: Three times a day (TID) | INTRAVENOUS | Status: DC
  Administered 2019-05-06 – 2019-05-08 (×7): 3.375 g via INTRAVENOUS
  Filled 2019-05-06 (×7): qty 50

## 2019-05-06 NOTE — Progress Notes (Signed)
  65 year old male history of AAA, DVT ( 2012, no longer on [facility]), GERD, anxiety attack, BPH HTN, seizure disorder, cigarette smoking, presents with right facial swelling, fever and pain on right upper tooth since Friday- 05/03/19, and on 2/21 was lightheaded while coming back from the bathroom and started to sweat with near syncopal event.  He also had associated vomiting x2, shortness of breath which he attributes to asthma, and had fever of 100.    In the ER noted edema of the left side of the face including under the left eye and the left cheek and the left upper lip with poor dentition and dental caries and decaying tooth in the left side mandibular area without significant intraoral edema, uvula not deviated no elevation of the floor of the mouth. Showed leukocytosis 14.5, hyponatremia sodium 125, CT scan of the face showed "1. Left facial cellulitis, possibly odontogenic with mild periapical lucency at the residual anterior left maxillary tooth. 2. There appears to be edema or fluid along the maxilla alveolar process posterior to the tooth, thus gingival or subperiosteal abscess is difficult to exclude.But no other fluid collection or complicating features.3.There is mild to moderate left side paranasal sinus inflammation although not obviously related to #1. 4.Proximal right ICA atherosclerosis which may be hemodynamically significant"   Blood culture obtained treated with vancomycin and Zosyn.  EDP after discussing with the admitting physician attempted to contact dental/oral surgery at [place] - but both did not have dental or surgery on-call, then contacted [facility] per ED PA Phylliss Bob ' I spoke with oral surgery at [facility] who states that patient does not sound like an oral surgery case, is a dental concerns".  He was subsequently admitted.  On exam, Patient reports his pain and swelling feels better today and has more energy this morning. Left lower leg is still swollen and  mild swelling on the left face and tenderness. Denies any trouble swallowing or speaking.  Issues  Sepsis secondary to dental infection and left facial cellulitis +/- odontogeinc abscess: he appears improving this morning in terms of his white counts and symptoms.  CT report as above.Has leukocytosis but afebrile T-max 99.8,blood pressure in 160s heart rate in 80s to 90s.Blood culture were sent.Patient is on vancomycin./cefepime- will switch to Zosyn (for anaerobic coverage),cont vanco- pharmacy dosing. Repeat labs continue antibiotics.I paged on-call dental surgery Dr. Tamala Julian and discussed-advised to cont iv antibiotics and once cellulitis improves f/u as outpatient for dental extraction; if his cellulitis and swelling does not improve will need to contact oral surgeon who is not on-call today Dr. Mancel Parsons is on call tomorrow. Case has been discussed with oral surgeon at [facility] and multiple hospitalst.  Hyponatremia improving IV fluids.  Hold HCTZ.  AKI on CKD on HCTZ enalapril.  Continue IV fluid hydration. AAA status post graft repair. HTN: on amlodipine, HCTZ, Benzapril at home HLD on pravastatin Anxiety disorder on Xanax History of asthma continue inhaler as needed. GERD: continue PPI BPH: continue Flomax

## 2019-05-06 NOTE — Progress Notes (Signed)
Pharmacy Antibiotic Note  [name] is a 64 y.o. male admitted on 05/05/2019 with dental infection. vanc per pharmacy started earlier now pharmacy has been consulted for zosyn dosing.  Plan: Zosyn 3.375g IV Q8H infused over 4hrs. Continue vanc 1500mg  IV q24h Daily Scr Follow renal function, cultures and clinical course   Height: 6\' 4"  (193 cm) Weight: 217 lb 13 oz (98.8 kg) IBW/kg (Calculated) : 86.8  Temp (24hrs), Avg:99 F (37.2 C), Min:98.1 F (36.7 C), Max:99.8 F (37.7 C)  Recent Labs  Lab 05/05/19 1634 05/06/19 0116 05/06/19 0423  WBC 14.5*  --   --   CREATININE 1.86* 1.74* 1.63*  LATICACIDVEN 1.3  --   --     Estimated Creatinine Clearance: 56.9 mL/min (A) (by C-G formula based on SCr of 1.63 mg/dL (H)).    Allergies  Allergen Reactions  . Cortisone   . Fentanyl     hypotension  . Sulfonamide Derivatives     REACTION: rash    Antimicrobials this admission: 2/21 clindamycin >> x1 ED 2/21 cefepime >> 2/22 2/22 vancomycin >>  2/22 zosyn >>  Dose adjustments this admission:   Microbiology results:  BCx:   UCx:    Sputum:    MRSA PCR:  Thank you for allowing pharmacy to be a part of this patient's care.  Dolly Rias RPh 05/06/2019, 8:23 AM

## 2019-05-06 NOTE — Progress Notes (Signed)
Pharmacy Antibiotic Note  [name] is a 64 y.o. male admitted on 05/05/2019 with sepsis.  Pharmacy has been consulted for vancomycin dosing.  Plan: Vancomycin 2 Gm x1 then 1500 mg IV q24h for est AUC = 460 Use scr = 1.86 (elevated from previous admissions 2019 1.41>1.46>1.52>1.67) Goal AUC = 400-550 F/u scr/cultures- may need to adjust dose if Scr improves  Height: 6\' 4"  (193 cm) Weight: 217 lb 13 oz (98.8 kg) IBW/kg (Calculated) : 86.8  Temp (24hrs), Avg:99 F (37.2 C), Min:98.1 F (36.7 C), Max:99.8 F (37.7 C)  Recent Labs  Lab 05/05/19 1634  WBC 14.5*  CREATININE 1.86*  LATICACIDVEN 1.3    Estimated Creatinine Clearance: 49.9 mL/min (A) (by C-G formula based on SCr of 1.86 mg/dL (H)).    Allergies  Allergen Reactions  . Cortisone   . Fentanyl     hypotension  . Sulfonamide Derivatives     REACTION: rash    Antimicrobials this admission: 2/21 clindamycin >> x1 ED 2/21 cefepime >> 2/22 vancomycin >>   Dose adjustments this admission:   Microbiology results:  BCx:   UCx:    Sputum:    MRSA PCR:   Thank you for allowing pharmacy to be a part of this patient's care.  Dorrene German 05/06/2019 1:01 AM

## 2019-05-06 NOTE — H&P (Signed)
History and Physical    [name] [id number] DOB: 12/06/1955 DOA: 05/05/2019  PCP: Libby Maw, MD   Patient coming from: Dental pain/facial swelling/fevers  I have personally briefly reviewed patient's old medical records in [place]  Chief Complaint: "I have an abscessed tooth"  HPI: [name] is a 64 y.o. male with medical history significant of HTN, HLD, GERD, AAA s/p repair, Hx of DVT (2012, no longer on [facility]), Anxiety attacks, and BPH who presents with fevers and facial swelling.  Patient reports he was in [place] thru Friday when he began to have dental pain and L mandibular pain.  He states he noticed some swelling at that time as well.  He reports swelling progressed thru Saturday and has remained stable since.  No drainage, no foul smell/taste reported.  On Saturday he began to develop fevers and chills.  He reports he also became lightheaded.  He has only been able to tolerate liquids since Saturday.  He has taken medications.  He has hx of poor dentition and reports two teeth in the past have been pulled for similar reasons.  He reports feeling anxious right now, but no other pain, no sob or cough (feels anxiety is making him sob but not otherwise).  He had some nausea now improved.  Normal bowel habits.  No dysuria.  He smokes 1 ppd, has not tried to quit.  He denies any alcohol use or drug use.  Ambulates without DME.  Wishes to be full code.    Review of Systems: As per HPI otherwise 10 point review of systems negative.    Past Medical History:  Diagnosis Date  . AAA (abdominal aortic aneurysm) ([place])   . AAA (abdominal aortic aneurysm) without rupture ([place])   . Arthritis   . BPH (benign prostatic hyperplasia)   . Claudication [facility])    lower extremities  . Claustrophobia    panic attacks  . COLONIC POLYPS, HX OF 05/02/2007  . DVT (deep venous thrombosis) ([place])   . Dyspnea    with exertion  . GERD 11/08/2006  . HYPERCHOLESTEROLEMIA,  SEVERE 05/02/2007  . HYPERTENSION 11/08/2006  . LOW BACK PAIN 11/22/2007  . 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 PONV (postoperative nausea and vomiting)   . Seizures ([place])    from concussion several months ago has been having headache  . Weak urine stream    Slow to get started    Past Surgical History:  Procedure Laterality Date  . ABDOMINAL AORTIC ENDOVASCULAR STENT GRAFT N/A 07/17/2017   Procedure: ABDOMINAL AORTIC ENDOVASCULAR STENT GRAFT REPAIR WITH ILIAC BRANCH DEVICE;  Surgeon: [name], MD;  Location: [facility] OR;  Service: Vascular;  Laterality: N/A;  . ANGIOPLASTY  09/20/10   Rt AK to tibioperoneal trunk BPG w/ vein patch angioplasty of tibioperoneal trunk   . carpel tunnel surgery  per pt. 6-7 years ago   bilateral  (done 2 weeks apart)  . FOOT SURGERY     right x2  . MOHS SURGERY     lower lip  . PR VEIN BYPASS GRAFT,AORTO-FEM-POP       reports that he has been smoking cigarettes. He has a 40.00 pack-year smoking history. He has never used smokeless tobacco. He reports that he does not drink alcohol or use drugs.  Allergies  Allergen Reactions  . Cortisone   . Fentanyl     hypotension  . Sulfonamide Derivatives     REACTION: rash    Family  History  Problem Relation Age of Onset  . Adrenal disorder Brother   . Hyperlipidemia Brother   . Hypertension Brother   . Heart disease Brother        Heart Disease before age 96  . Heart attack Brother   . Heart disease Mother        Heart Disease before age 48  . Hyperlipidemia Mother   . Hypertension Mother   . Heart attack Mother   . Heart disease Father        Heart Disease before age 37  . Hyperlipidemia Father   . Heart attack Father   . Pulmonary embolism Sister      Prior to Admission medications   Medication Sig Start Date End Date Taking? Authorizing Provider  hydrochlorothiazide (HYDRODIURIL) 25 MG tablet TAKE 1 TABLET BY MOUTH EVERY DAY 03/12/19   Libby Maw, MD  albuterol  (VENTOLIN HFA) 108 (90 Base) MCG/ACT inhaler Inhale 1 puff into the lungs every 6 (six) hours as needed for wheezing or shortness of breath. 04/24/19   Libby Maw, MD  ALPRAZolam Duanne Moron) 0.5 MG tablet TAKE 1 TABLET BY MOUTH TWICE A DAY AS NEEDED FOR ANXIETY 04/22/19   Libby Maw, MD  amLODipine (NORVASC) 10 MG tablet TAKE 1 TABLET BY MOUTH EVERY DAY 04/22/19   Libby Maw, MD  aspirin 81 MG tablet Take 1 tablet (81 mg total) by mouth daily. Patient not taking: Reported on 03/11/2019 06/18/18   Libby Maw, MD  ASPIRIN LOW DOSE 81 MG EC tablet TAKE 1 TABLET BY MOUTH EVERY DAY 12/28/18   Libby Maw, MD  benazepril (LOTENSIN) 40 MG tablet TAKE 1 TABLET BY MOUTH EVERY DAY 12/11/18   Libby Maw, MD  Multiple Vitamin (MULTIVITAMIN) tablet Take 1 tablet by mouth daily.      [provider]  omeprazole (PRILOSEC) 20 MG capsule TAKE 1 CAPSULE BY MOUTH EVERY DAY 12/11/18   Libby Maw, MD  pravastatin (PRAVACHOL) 40 MG tablet TAKE 2 TABLETS BY MOUTH EVERY DAY 12/28/18   Libby Maw, MD  SPIRIVA HANDIHALER 18 MCG inhalation capsule INHALE 1 CAPSULE VIA HANDIHALER ONCE DAILY AT THE SAME TIME EVERY DAY 04/05/19   Libby Maw, MD  tamsulosin (FLOMAX) 0.4 MG CAPS capsule TAKE 1 CAPSULE BY MOUTH EVERY DAY 04/22/19   Libby Maw, MD  traMADol (ULTRAM) 50 MG tablet Take 1 tablet (50 mg total) by mouth every 6 (six) hours as needed. 08/16/17   Marletta Lor, MD    Physical Exam: Vitals:   05/05/19 2130 05/05/19 2230 05/05/19 2300 05/06/19 0003  BP: (!) 160/88 (!) 163/83 (!) 149/90 (!) 161/83  Pulse:    91  Resp: 18 15 19    Temp:    98.1 F (36.7 C)  TempSrc:    Oral  SpO2: 97%   95%  Weight:    98.8 kg  Height:    6\' 4"  (1.93 m)    Vitals:   05/05/19 2130 05/05/19 2230 05/05/19 2300 05/06/19 0003  BP: (!) 160/88 (!) 163/83 (!) 149/90 (!) 161/83  Pulse:    91  Resp: 18 15 19    Temp:     98.1 F (36.7 C)  TempSrc:    Oral  SpO2: 97%   95%  Weight:    98.8 kg  Height:    6\' 4"  (1.93 m)     Constitutional: NAD, calm, comfortable Eyes: PERRL, L periorbital edema ENMT: Mucous membranes  are moist. Posterior pharynx clear of any exudate or lesions. Very poor dentition, many teeth missing, dental caries, no drainage noted to tooth in L. Mandibular area.  Uvula midline.  Tenderness to L. Side of face with erythema. Neck: normal, supple, no masses, no thyromegaly Respiratory: clear to auscultation bilaterally, no wheezing, no crackles. Normal respiratory effort. No accessory muscle use.  Cardiovascular: Regular rate and rhythm, no murmurs / rubs / gallops. No extremity edema. 2+ pedal pulses. No carotid bruits.  Abdomen: no tenderness, no masses palpated. No hepatosplenomegaly. Bowel sounds positive.  Musculoskeletal: no clubbing / cyanosis. No joint deformity upper and lower extremities. Good ROM, no contractures. Normal muscle tone.  Skin: no rashes, lesions, ulcers. No induration Neurologic: CN 2-12 grossly intact. Sensation and strength grossly intact. Psychiatric: Normal judgment and insight. Alert and oriented x 3. Normal mood.    Labs on Admission: I have personally reviewed following labs and imaging studies  CBC: Recent Labs  Lab 05/05/19 1634  WBC 14.5*  NEUTROABS 11.2*  HGB 16.9  HCT 49.8  MCV 91.0  PLT [id number]   Basic Metabolic Panel: Recent Labs  Lab 05/05/19 1634  NA 125*  K 3.5  CL 89*  CO2 23  GLUCOSE 115*  BUN 20  CREATININE 1.86*  CALCIUM 9.5   GFR: Estimated Creatinine Clearance: 49.9 mL/min (A) (by C-G formula based on SCr of 1.86 mg/dL (H)). Liver Function Tests: Recent Labs  Lab 05/05/19 1634  AST 22  ALT 20  ALKPHOS 44  BILITOT 0.8  PROT 8.7*  ALBUMIN 4.4   No results for input(s): LIPASE, AMYLASE in the last 168 hours. No results for input(s): AMMONIA in the last 168 hours. Coagulation Profile: Recent Labs  Lab  05/05/19 1634  INR 1.0   Cardiac Enzymes: No results for input(s): CKTOTAL, CKMB, CKMBINDEX, TROPONINI in the last 168 hours. BNP (last 3 results) No results for input(s): PROBNP in the last 8760 hours. HbA1C: No results for input(s): HGBA1C in the last 72 hours. CBG: No results for input(s): GLUCAP in the last 168 hours. Lipid Profile: No results for input(s): CHOL, HDL, LDLCALC, TRIG, CHOLHDL, LDLDIRECT in the last 72 hours. Thyroid Function Tests: No results for input(s): TSH, T4TOTAL, FREET4, T3FREE, THYROIDAB in the last 72 hours. Anemia Panel: No results for input(s): VITAMINB12, FOLATE, FERRITIN, TIBC, IRON, RETICCTPCT in the last 72 hours. Urine analysis:    Component Value Date/Time   COLORURINE YELLOW 05/05/2019 2023   APPEARANCEUR CLEAR 05/05/2019 2023   APPEARANCEUR Clear 05/17/2013 1506   LABSPEC 1.010 05/05/2019 2023   LABSPEC 1.031 05/17/2013 1506   PHURINE 6.5 05/05/2019 2023   GLUCOSEU NEGATIVE 05/05/2019 2023   GLUCOSEU Negative 05/17/2013 1506   HGBUR TRACE (A) 05/05/2019 2023   HGBUR negative 04/07/2009 0950   BILIRUBINUR NEGATIVE 05/05/2019 2023   BILIRUBINUR neg 06/23/2016 1529   BILIRUBINUR Negative 05/17/2013 1506   KETONESUR NEGATIVE 05/05/2019 2023   PROTEINUR NEGATIVE 05/05/2019 2023   UROBILINOGEN 0.2 06/23/2016 1529   UROBILINOGEN 0.2 04/07/2009 0950   NITRITE NEGATIVE 05/05/2019 2023   LEUKOCYTESUR NEGATIVE 05/05/2019 2023   LEUKOCYTESUR Negative 05/17/2013 1506    Radiological Exams on Admission: CT Maxillofacial W Contrast  Result Date: 05/05/2019 CLINICAL DATA:  64 year old male with suspected dental infection, left upper front tooth. EXAM: CT MAXILLOFACIAL WITH CONTRAST TECHNIQUE: Multidetector CT imaging of the maxillofacial structures was performed with intravenous contrast. Multiplanar CT image reconstructions were also generated. CONTRAST:  54mL OMNIPAQUE IOHEXOL 300 MG/ML  SOLN COMPARISON:  Head  CT 08/31/2016. FINDINGS: Osseous:  Intact mandible. Multiple areas of absent dentition bilaterally. A left maxillary residual canine or anterior bicuspid is noted with mild periapical lucency (series 3, image 47 and series 7, image 28). On the right side there is more severe periapical lucency at the anterior right maxillary molar. Otherwise the maxilla appears intact. Intact zygoma, nasal bones, central skull base. Visible cervical vertebrae also appear intact with degenerative changes. Orbits: Orbital walls intact. Orbits soft tissues appear symmetric and within normal limits. Sinuses: Up to moderate left side paranasal sinus mucosal thickening at the frontoethmoidal recess, ethmoids and left maxillary sinus. No sinus fluid levels. Contralateral right paranasal sinuses are largely clear. Tympanic cavities and mastoids are clear. Soft tissues: Asymmetric left face soft tissue swelling and stranding including the left buccal space and lateral to the left mandible (series 2, image 76). There does appear to be subperiosteal fluid along the left maxilla alveolar process as seen on series 2, image 54 and series 6, image 34, although this is somewhat posterior to the anterior left maxillary tooth. No soft tissue gas. No other soft tissue fluid is evident. The left sublingual and submandibular spaces are spared. There is mild thickening of the left platysma. The left masticator and parotid spaces also appear spared. Negative visible larynx, pharynx, parapharyngeal and retropharyngeal spaces. No cervical lymphadenopathy. Grossly patent major vascular structures in the neck and at the skull base with moderate to advanced proximal right ICA atherosclerosis. The left ICA siphon is also heavily calcified. Limited intracranial: Negative. IMPRESSION: 1. Left facial cellulitis, possibly odontogenic with mild periapical lucency at the residual anterior left maxillary tooth. 2. There appears to be edema or fluid along the maxilla alveolar process posterior to the  tooth, thus gingival or subperiosteal abscess is difficult to exclude. But no other fluid collection or complicating features. 3. There is mild to moderate left side paranasal sinus inflammation although not obviously related to #1. 4. Proximal right ICA atherosclerosis which may be hemodynamically significant. Electronically Signed   By: Genevie Ann M.D.   On: 05/05/2019 20:05   DG Chest Port 1 View  Result Date: 05/05/2019 CLINICAL DATA:  Tachycardia and shortness of breath EXAM: PORTABLE CHEST 1 VIEW COMPARISON:  Chest radiograph dated 07/17/2017 FINDINGS: The heart size and mediastinal contours are within normal limits. Eventration of the right hemidiaphragm is redemonstrated. Both lungs are clear. The visualized skeletal structures are unremarkable. IMPRESSION: No active disease. Electronically Signed   By: Zerita Boers M.D.   On: 05/05/2019 16:52     Assessment/Plan [name] is a 64 y.o. male with medical history significant of HTN, HLD, GERD, AAA s/p repair, Hx of DVT (2012, no longer on [facility]), Anxiety attacks, and BPH who presents with fevers and facial swelling concerning for sepsis 2/2 dental infection.  # Sepsis 2/2 Dental infection +/- abscess c/b facial cellulitis - no visualized abscess on exam or purulent drainage but CT Maxillofacial reports edema and difficult to exclude gingival/subperiosteal abscess - leukocytosis 14.5K, reported fevers - case had been d/w multiple institutions and oral surgery at [facility] before ultimately admitted to [facility].  Dental is on-call tomorrow and would request early consult, Oral Surgery available within the following day if needed.  Anticipate removal - Blood Cx obtained prior to arrival - Continue Vanc, Zosyn  # AKI on CKD - worsening is likely pre-renal in setting of poor intake/lightheadedness but also likely progression of CKD - held enalapril and HCTZ - monitor I/O - avoid nephrotoxic agents  #  Hyponatremia - suspect hypovolemic, continue NS  (s/p 2L in ER) - monitor q4H BMP and reduce once improved  # HTN - continue amlodipine, held HCTZ and Enalapril in setting of hyponatremia and developing AKI  # AAA s/p graft repair - continue amlodipine, aspirin - monitor BP  # HLD - continue pravastatin  # GERD - continue pantoprazole  # BPH - continue tamsulosin  DVT prophylaxis: Lovenox Code Status: Full Consults called: Dentist on-call needs to be called in AM Admission status: inpatient   Truddie Hidden MD [facility] Pager [phone]  If 7PM-7AM, please contact night-coverage [email or web address] Password [facility]  05/06/2019, 12:28 AM

## 2019-05-06 NOTE — Plan of Care (Signed)

## 2019-05-07 ENCOUNTER — Other Ambulatory Visit: Payer: Self-pay | Admitting: Family Medicine

## 2019-05-07 DIAGNOSIS — I10 Essential (primary) hypertension: Secondary | ICD-10-CM

## 2019-05-07 DIAGNOSIS — N183 Chronic kidney disease, stage 3 unspecified: Secondary | ICD-10-CM

## 2019-05-07 DIAGNOSIS — E871 Hypo-osmolality and hyponatremia: Secondary | ICD-10-CM | POA: Diagnosis not present

## 2019-05-07 LAB — BASIC METABOLIC PANEL
Anion gap: 12 (ref 5–15)
BUN: 25 mg/dL — ABNORMAL HIGH (ref 8–23)
CO2: 23 mmol/L (ref 22–32)
Calcium: 9.5 mg/dL (ref 8.9–10.3)
Chloride: 96 mmol/L — ABNORMAL LOW (ref 98–111)
Creatinine, Ser: 1.67 mg/dL — ABNORMAL HIGH (ref 0.61–1.24)
GFR calc Af Amer: 50 mL/min — ABNORMAL LOW (ref >60)
GFR calc non Af Amer: 43 mL/min — ABNORMAL LOW (ref >60)
Glucose, Bld: 83 mg/dL (ref 70–99)
Potassium: 3.8 mmol/L (ref 3.5–5.1)
Sodium: 131 mmol/L — ABNORMAL LOW (ref 135–145)

## 2019-05-07 LAB — CBC
HCT: 47.8 % (ref 39.0–52.0)
Hemoglobin: 15.9 g/dL (ref 13.0–17.0)
MCH: 30.6 pg (ref 26.0–34.0)
MCHC: 33.3 g/dL (ref 30.0–36.0)
MCV: 92.1 fL (ref 80.0–100.0)
Platelets: 221 10*3/uL (ref 150–400)
RBC: 5.19 MIL/uL (ref 4.22–5.81)
RDW: 13 % (ref 11.5–15.5)
WBC: 9.4 10*3/uL (ref 4.0–10.5)
nRBC: 0 % (ref 0.0–0.2)

## 2019-05-07 MED ORDER — ALPRAZOLAM 0.5 MG PO TABS
0.5000 mg | ORAL_TABLET | Freq: Two times a day (BID) | ORAL | Status: DC | PRN
  Administered 2019-05-07 (×2): 0.5 mg via ORAL
  Filled 2019-05-07 (×2): qty 1

## 2019-05-07 NOTE — TOC Initial Note (Signed)
Transition of Care [facility]) - Initial/Assessment Note    Patient Details  Name: [name] MRN: [id number] Date of Birth: 1955/07/10  Transition of Care [facility]) CM/SW Contact:    Dessa Phi, RN Phone Number: 05/07/2019, 11:24 AM  Clinical Narrative: Referral for dentis list-provided patient w/dentist list also informed of contacted customer service also for dentist list. Patient/dtr voiced understanding. No further CM needs.                  Expected Discharge Plan: Home/Self Care Barriers to Discharge: Continued Medical Work up   Patient Goals and CMS Choice        Expected Discharge Plan and Services Expected Discharge Plan: Home/Self Care   Discharge Planning Services: CM Consult   Living arrangements for the past 2 months: Single Family Home                                      Prior Living Arrangements/Services Living arrangements for the past 2 months: Single Family Home Lives with:: Self Patient language and need for interpreter reviewed:: Yes Do you feel safe going back to the place where you live?: Yes      Need for Family Participation in Patient Care: No (Comment) Care giver support system in place?: Yes (comment)   Criminal Activity/Legal Involvement Pertinent to Current Situation/Hospitalization: No - Comment as needed  Activities of Daily Living Home Assistive Devices/Equipment: Eyeglasses ADL Screening (condition at time of admission) Patient's cognitive ability adequate to safely complete daily activities?: Yes Is the patient deaf or have difficulty hearing?: No Does the patient have difficulty seeing, even when wearing glasses/contacts?: No Does the patient have difficulty concentrating, remembering, or making decisions?: No Patient able to express need for assistance with ADLs?: Yes Does the patient have difficulty dressing or bathing?: No Independently performs ADLs?: Yes (appropriate for developmental age) Does the patient have  difficulty walking or climbing stairs?: No Weakness of Legs: None Weakness of Arms/Hands: None  Permission Sought/Granted Permission sought to share information with : Case Manager Permission granted to share information with : Yes, Verbal Permission Granted  Share Information with NAME: Case Manager           Emotional Assessment Appearance:: Appears stated age Attitude/Demeanor/Rapport: Gracious Affect (typically observed): Accepting   Alcohol / Substance Use: Not Applicable Psych Involvement: No (comment)  Admission diagnosis:  Hyponatremia [E87.1] Dental infection [K04.7] Near syncope [R55] Sepsis ([place]) [A41.9] Sepsis without acute organ dysfunction, due to unspecified organism [facility]) [A41.9] Patient Active Problem List   Diagnosis Date Noted  . Hyponatremia 05/06/2019  . Sepsis ([place]) 05/05/2019  . Pulmonary emphysema ([place]) 03/11/2019  . Grieving 07/27/2018  . Anxiety disorder due to general medical condition 07/27/2018  . Reactive airway disease 07/27/2018  . Elevated PSA, less than 10 ng/ml 01/17/2018  . Muscle cramps 01/17/2018  . CKD (chronic kidney disease) stage 3, GFR 30-59 ml/min 01/17/2018  . Screen for colon cancer 01/16/2018  . Chronic kidney disease (CKD) stage G2/A2, mildly decreased glomerular filtration rate (GFR) between 60-89 mL/min/1.73 square meter and albuminuria creatinine ratio between 30-299 mg/g 08/16/2017  . AAA (abdominal aortic aneurysm) ([place]) 07/17/2017  . Mixed dyslipidemia 06/01/2017  . Cigarette smoker 06/01/2017  . BPH (benign prostatic hyperplasia) 09/22/2015  . Abdominal aortic aneurysm ([place]) 06/12/2013  . Abdominal pain, other specified site 05/22/2012  . Peripheral vascular disease, unspecified ([place]) 04/26/2011  . LOW BACK PAIN  11/22/2007  . 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 COLONIC POLYPS, HX OF 05/02/2007  . Essential hypertension 11/08/2006  . GERD 11/08/2006   PCP:  Libby Maw, MD Pharmacy:    [id number] - [place], [place] [place] [address] Phone: [phone] Fax: [phone] - [place], [place] - [address] AT [address] [place] [address] Phone: [phone] Fax: [phone]     Social Determinants of Health (SDOH) Interventions    Readmission Risk Interventions No flowsheet data found.

## 2019-05-07 NOTE — Progress Notes (Addendum)
PROGRESS NOTE    ROBERTS BON  ZHY:[id number] DOB: [birth year] DOA: 05/05/2019 PCP: Libby Maw, MD   Brief Narrative: 64 year old male history of AAA, DVT ( 2012, no longer on [facility]), GERD, anxiety attack, BPH HTN, seizure disorder, cigarette smoking, presents with right facial swelling, fever and pain on right upper tooth since Friday- 05/03/19, and on 2/21 was lightheaded while coming back from the bathroom and started to sweat with near syncopal event.  He also had associated vomiting x2, shortness of breath which he attributes to asthma, and had fever of 100.   In the ER noted edema of the left side of the face including under the left eye and the left cheek and the left upper lip with poor dentition and dental caries and decaying tooth in the left side mandibular area without significant intraoral edema, uvula not deviated no elevation of the floor of the mouth. Showed leukocytosis 14.5, hyponatremia sodium 125, CT scan of the face showed "1. Left facial cellulitis, possibly odontogenic with mild periapical lucency at the residual anterior left maxillary tooth. 2. There appears to be edema or fluid along the maxilla alveolar process posterior to the tooth, thus gingival or subperiosteal abscess is difficult to exclude.But no other fluid collection or complicating features.3.There is mild to moderate left side paranasal sinus inflammation although not obviously related to #1. 4.Proximal right ICA atherosclerosis which may be hemodynamically significant"  Blood culture obtained treated with vancomycin and Zosyn.  EDP after discussing with the admitting physician attempted to contact dental/oral surgery at [place] - but both did not have dental or surgery on-call, then contacted [facility] per ED PA Phylliss Bob ' I spoke with oral surgery at [place] who states that patient does not sound like an oral surgery case, is a dental concerns".  He was subsequently  admitted.  Subjective: Seen this morning left eye swelling left facial swelling is much improved almost by 60% still having pain on the left upper too/left face. Would like to try to eat some today.  Assessment & Plan:  Sepsis secondary to dental infection and left facial cellulitis +/- odontogeinc abscess:  Patient/left facial cellulitis, left lower eyelid swelling is nicely improving on vancomycin and Zosyn leukocytosis improved to 9.4K, afebrile. CT report was reviewed  And had discussed with dental surgery Dr. Tamala Julian 2/22-advised to cont iv antibiotics and once cellulitis improves  he can be discharged on clindamycin or Levaquin and f/u as outpatient for dental extraction soon- if does not improve-needs oral surgeon eval. Case has been discussed with oral surgeon at [facility] on admission by ED provider and felt oral surgery not needed and he was admitted.  I did speak with oral surgeon Dr. Mancel Parsons today who advises with antibiotics and follow-up in this office within a week- please give referral on discharge.  Hyponatremia improving IV fluids.Hold HCTZ.  Continue gentle IV fluids and allow oral intake.  AKI on CKD stage IIIa on HCTZ enalapril.  Creatinine improved to 1.6.  Baseline appears close to 1.4-1.5.  Gentle hydration.  AAA status post graft repair. HTN: on amlodipine, HCTZ, Benzapril at home.  Continue amlodipine for now.  Hopefully resume ACE inhibitor tomorrow HLD on pravastatin Anxiety disorder on Xanax History of asthma continue inhaler as needed. GERD: continue PPI BPH: continue Flomax  Body mass index is 26.51 kg/m.  Pressure Ulcer:    DVT prophylaxis:lovenox Code Status: Full code Family Communication: plan of care discussed with patient at bedside.  Updated patient's family at the bedside Disposition Plan: Patient is from: Home Anticipated Disposition: to home,in 1 to 2 days Barriers to discharge or conditions that needs to be met prior to discharge: Admit  inpatient for IV antibiotics discharge home once left facial cellulitis improves.  Consultants: Dental surgery, oral surgery over the phone [facility] by ED Procedures:see note Microbiology:see note  Medications: Scheduled Meds: . amLODipine  10 mg Oral Daily  . aspirin EC  81 mg Oral Daily  . enoxaparin (LOVENOX) injection  40 mg Subcutaneous Q24H  . multivitamin with minerals  1 tablet Oral Daily  . pantoprazole  40 mg Oral Daily  . pravastatin  80 mg Oral q1800  . tamsulosin  0.4 mg Oral Daily  . umeclidinium bromide  1 puff Inhalation Daily   Continuous Infusions: . sodium chloride Stopped (05/05/19 2313)  . sodium chloride Stopped (05/06/19 1544)  . piperacillin-tazobactam (ZOSYN)  IV 3.375 g (05/07/19 0915)  . vancomycin 1,500 mg (05/07/19 0412)    Antimicrobials: Anti-infectives (From admission, onward)   Start     Dose/Rate Route Frequency Ordered Stop   05/07/19 0400  vancomycin (VANCOREADY) IVPB 1500 mg/300 mL     1,500 mg 150 mL/hr over 120 Minutes Intravenous Every 24 hours 05/06/19 0201     05/06/19 0815  piperacillin-tazobactam (ZOSYN) IVPB 3.375 g     3.375 g 12.5 mL/hr over 240 Minutes Intravenous Every 8 hours 05/06/19 0814     05/06/19 0130  vancomycin (VANCOREADY) IVPB 2000 mg/400 mL     2,000 mg 200 mL/hr over 120 Minutes Intravenous  Once 05/06/19 0104 05/06/19 0406   05/05/19 2000  ceFEPIme (MAXIPIME) 2 g in sodium chloride 0.9 % 100 mL IVPB  Status:  Discontinued     2 g 200 mL/hr over 30 Minutes Intravenous Every 12 hours 05/05/19 1856 05/06/19 0814   05/05/19 1700  clindamycin (CLEOCIN) IVPB 600 mg     600 mg 100 mL/hr over 30 Minutes Intravenous  Once 05/05/19 1657 05/05/19 1812       Objective: Vitals: Today's Vitals   05/07/19 0457 05/07/19 0516 05/07/19 0824 05/07/19 0900  BP: (!) 143/83   (!) 161/88  Pulse: 87   83  Resp: '17  15 16  '$ Temp: (!) 97.5 F (36.4 C)   (!) 97.5 F (36.4 C)  TempSrc: Oral   Oral  SpO2: (!) 88%   95%   Weight:      Height:      PainSc:  Asleep  8     Intake/Output Summary (Last 24 hours) at 05/07/2019 1053 Last data filed at 05/07/2019 0600 Gross per 24 hour  Intake 1977.8 ml  Output --  Net 1977.8 ml   Filed Weights   05/05/19 1527 05/05/19 1949 05/06/19 0003  Weight: 95.3 kg 105.9 kg 98.8 kg   Weight change:    Intake/Output from previous day: 02/22 0701 - 02/23 0700 In: 1977.8 [P.O.:240; I.V.:1312; IV Piggyback:425.8] Out: -  Intake/Output this shift: No intake/output data recorded.  Examination:  General exam: AAOx3 ,NAD, weak appearing. HEENT:Oral mucosa moist, Ear/Nose WNL grossly, dental caries present multiple tooth, left lower eyelid redness and swelling much improved mild left facial swelling and redness and tenderness present. Respiratory system: bilaterally clear,no wheezing or crackles,no use of accessory muscle, non tender. Cardiovascular system: S1 & S2 +, regular, No JVD. Gastrointestinal system: Abdomen soft, NT,ND, BS+. Nervous System:Alert, awake, moving extremities and grossly nonfocal Extremities: No edema, distal peripheral pulses palpable.  Skin: No  rashes,no icterus. MSK: Normal muscle bulk,tone, power  Data Reviewed: I have personally reviewed following labs and imaging studies CBC: Recent Labs  Lab 05/05/19 1634 05/06/19 0819 05/07/19 0415  WBC 14.5* 12.3* 9.4  NEUTROABS 11.2*  --   --   HGB 16.9 14.9 15.9  HCT 49.8 43.8 47.8  MCV 91.0 92.6 92.1  PLT 199 186 202   Basic Metabolic Panel: Recent Labs  Lab 05/06/19 0807 05/06/19 1150 05/06/19 1620 05/06/19 2043 05/07/19 0415  NA 129* 129* 130* 130* 131*  K 3.8 3.9 3.7 3.5 3.8  CL 94* 94* 94* 94* 96*  CO2 21* _0 GLUCOSE 98 92 87 83 83  BUN 20 23 24* 25* 25*  CREATININE 1.64* 1.63* 1.63* 1.79* 1.67*  CALCIUM 9.2 9.3 9.3 9.1 9.5   GFR: Estimated Creatinine Clearance: 55.6 mL/min (A) (by C-G formula based on SCr of 1.67 mg/dL (H)). Liver Function Tests: Recent Labs   Lab 05/05/19 1634  AST 22  ALT 20  ALKPHOS 44  BILITOT 0.8  PROT 8.7*  ALBUMIN 4.4   No results for input(s): LIPASE, AMYLASE in the last 168 hours. No results for input(s): AMMONIA in the last 168 hours. Coagulation Profile: Recent Labs  Lab 05/05/19 1634  INR 1.0   Cardiac Enzymes: No results for input(s): CKTOTAL, CKMB, CKMBINDEX, TROPONINI in the last 168 hours. BNP (last 3 results) No results for input(s): PROBNP in the last 8760 hours. HbA1C: No results for input(s): HGBA1C in the last 72 hours. CBG: No results for input(s): GLUCAP in the last 168 hours. Lipid Profile: No results for input(s): CHOL, HDL, LDLCALC, TRIG, CHOLHDL, LDLDIRECT in the last 72 hours. Thyroid Function Tests: No results for input(s): TSH, T4TOTAL, FREET4, T3FREE, THYROIDAB in the last 72 hours. Anemia Panel: No results for input(s): VITAMINB12, FOLATE, FERRITIN, TIBC, IRON, RETICCTPCT in the last 72 hours. Sepsis Labs: Recent Labs  Lab 05/05/19 1634  LATICACIDVEN 1.3    Recent Results (from the past 240 hour(s))  Culture, blood (routine x 2)     Status: None (Preliminary result)   Collection Time: 05/05/19  4:25 PM   Specimen: BLOOD  Result Value Ref Range Status   Specimen Description BLOOD LEFT ANTECUBITAL  Final   Special Requests   Final    BOTTLES DRAWN AEROBIC AND ANAEROBIC Blood Culture adequate volume Performed at [facility], [address] [place], [place] [address]    Culture   Final    NO GROWTH 2 DAYS Performed at [facility], [place] [address], [place], [place] [address]    Report Status PENDING  Incomplete  Blood Culture (routine x 2)     Status: None (Preliminary result)   Collection Time: 05/05/19  4:45 PM   Specimen: BLOOD LEFT FOREARM  Result Value Ref Range Status   Specimen Description   Final    BLOOD LEFT FOREARM Performed at [facility], [address] [place], [place] [address]    Special Requests   Final    BOTTLES  DRAWN AEROBIC ONLY Blood Culture adequate volume Performed at [facility], [address] [place], [place] [address]    Culture   Final    NO GROWTH 2 DAYS Performed at [facility], [place] [address] [place], [place] [address]    Report Status PENDING  Incomplete  SARS Coronavirus 2 Ag (30 min TAT) - Nasal Swab (BD Veritor Kit)  Status: None   Collection Time: 05/05/19  4:51 PM   Specimen: Nasal Swab (BD Veritor Kit)  Result Value Ref Range Status   SARS Coronavirus 2 Ag NEGATIVE NEGATIVE Final    Comment: (NOTE) SARS-CoV-2 antigen NOT DETECTED.  Negative results are presumptive.  Negative results do not preclude SARS-CoV-2 infection and should not be used as the sole basis for treatment or other patient management decisions, including infection  control decisions, particularly in the presence of clinical signs and  symptoms consistent with COVID-19, or in those who have been in contact with the virus.  Negative results must be combined with clinical observations, patient history, and epidemiological information. The expected result is Negative. Fact Sheet for Patients: PodPark.tn Fact Sheet for Healthcare Providers: GiftContent.is This test is not yet approved or cleared by the [place] FDA and  has been authorized for detection and/or diagnosis of SARS-CoV-2 by FDA under an Emergency Use Authorization (EUA).  This EUA will remain in effect (meaning this test can be used) for the duration of  the COVID-19 de claration under Section 564(b)(1) of the Act, 21 U.S.C. section 360bbb-3(b)(1), unless the authorization is terminated or revoked sooner. Performed at [facility], [address] [place], [place] [address]   Respiratory Panel by RT PCR (Flu A&B, Covid) - Nasopharyngeal Swab     Status: None   Collection Time: 05/05/19  6:14 PM   Specimen: Nasopharyngeal Swab  Result Value Ref  Range Status   SARS Coronavirus 2 by RT PCR NEGATIVE NEGATIVE Final    Comment: (NOTE) SARS-CoV-2 target nucleic acids are NOT DETECTED. The SARS-CoV-2 RNA is generally detectable in upper respiratoy specimens during the acute phase of infection. The lowest concentration of SARS-CoV-2 viral copies this assay can detect is 131 copies/mL. A negative result does not preclude SARS-Cov-2 infection and should not be used as the sole basis for treatment or other patient management decisions. A negative result may occur with  improper specimen collection/handling, submission of specimen other than nasopharyngeal swab, presence of viral mutation(s) within the areas targeted by this assay, and inadequate number of viral copies (<131 copies/mL). A negative result must be combined with clinical observations, patient history, and epidemiological information. The expected result is Negative. Fact Sheet for Patients:  PinkCheek.be Fact Sheet for Healthcare Providers:  GravelBags.it This test is not yet ap proved or cleared by the [place] FDA and  has been authorized for detection and/or diagnosis of SARS-CoV-2 by FDA under an Emergency Use Authorization (EUA). This EUA will remain  in effect (meaning this test can be used) for the duration of the COVID-19 declaration under Section 564(b)(1) of the Act, 21 U.S.C. section 360bbb-3(b)(1), unless the authorization is terminated or revoked sooner.    Influenza A by PCR NEGATIVE NEGATIVE Final   Influenza B by PCR NEGATIVE NEGATIVE Final    Comment: (NOTE) The Xpert Xpress SARS-CoV-2/FLU/RSV assay is intended as an aid in  the diagnosis of influenza from Nasopharyngeal swab specimens and  should not be used as a sole basis for treatment. Nasal washings and  aspirates are unacceptable for Xpert Xpress SARS-CoV-2/FLU/RSV  testing. Fact Sheet for  Patients: PinkCheek.be Fact Sheet for Healthcare Providers: GravelBags.it This test is not yet approved or cleared by the [place] FDA and  has been authorized for detection and/or diagnosis of SARS-CoV-2 by  FDA under an Emergency Use Authorization (EUA). This EUA will remain  in effect (meaning this test can be used) for the  duration of the  Covid-19 declaration under Section 564(b)(1) of the Act, 21  U.S.C. section 360bbb-3(b)(1), unless the authorization is  terminated or revoked. Performed at [facility], [address] [place], [place] [address]   Urine culture     Status: None   Collection Time: 05/05/19  8:23 PM   Specimen: In/Out Cath Urine  Result Value Ref Range Status   Specimen Description   Final    IN/OUT CATH URINE Performed at [facility], [address] [place], [place] [address]    Special Requests   Final    NONE Performed at [facility], [address] [place], [place] [address]    Culture   Final    NO GROWTH Performed at [facility], [place] [address], [place], [place] [address]    Report Status 05/06/2019 FINAL  Final  MRSA PCR Screening     Status: None   Collection Time: 05/06/19  8:59 AM   Specimen: Nasal Mucosa; Nasopharyngeal  Result Value Ref Range Status   MRSA by PCR NEGATIVE NEGATIVE Final    Comment:        The GeneXpert MRSA Assay (FDA approved for NASAL specimens only), is one component of a comprehensive MRSA colonization surveillance program. It is not intended to diagnose MRSA infection nor to guide or monitor treatment for MRSA infections. Performed at [facility], [place] [address], [place], [place] [address]       Radiology Studies: CT Maxillofacial W Contrast  Result Date: 05/05/2019 CLINICAL DATA:  64 year old male with suspected dental infection, left upper front tooth. EXAM: CT MAXILLOFACIAL WITH  CONTRAST TECHNIQUE: Multidetector CT imaging of the maxillofacial structures was performed with intravenous contrast. Multiplanar CT image reconstructions were also generated. CONTRAST:  42m OMNIPAQUE IOHEXOL 300 MG/ML  SOLN COMPARISON:  Head CT 08/31/2016. FINDINGS: Osseous: Intact mandible. Multiple areas of absent dentition bilaterally. A left maxillary residual canine or anterior bicuspid is noted with mild periapical lucency (series 3, image 47 and series 7, image 28). On the right side there is more severe periapical lucency at the anterior right maxillary molar. Otherwise the maxilla appears intact. Intact zygoma, nasal bones, central skull base. Visible cervical vertebrae also appear intact with degenerative changes. Orbits: Orbital walls intact. Orbits soft tissues appear symmetric and within normal limits. Sinuses: Up to moderate left side paranasal sinus mucosal thickening at the frontoethmoidal recess, ethmoids and left maxillary sinus. No sinus fluid levels. Contralateral right paranasal sinuses are largely clear. Tympanic cavities and mastoids are clear. Soft tissues: Asymmetric left face soft tissue swelling and stranding including the left buccal space and lateral to the left mandible (series 2, image 76). There does appear to be subperiosteal fluid along the left maxilla alveolar process as seen on series 2, image 54 and series 6, image 34, although this is somewhat posterior to the anterior left maxillary tooth. No soft tissue gas. No other soft tissue fluid is evident. The left sublingual and submandibular spaces are spared. There is mild thickening of the left platysma. The left masticator and parotid spaces also appear spared. Negative visible larynx, pharynx, parapharyngeal and retropharyngeal spaces. No cervical lymphadenopathy. Grossly patent major vascular structures in the neck and at the skull base with moderate to advanced proximal right ICA atherosclerosis. The left ICA siphon is also  heavily calcified. Limited intracranial: Negative. IMPRESSION: 1. Left facial cellulitis, possibly odontogenic with mild periapical lucency at the residual anterior left  maxillary tooth. 2. There appears to be edema or fluid along the maxilla alveolar process posterior to the tooth, thus gingival or subperiosteal abscess is difficult to exclude. But no other fluid collection or complicating features. 3. There is mild to moderate left side paranasal sinus inflammation although not obviously related to #1. 4. Proximal right ICA atherosclerosis which may be hemodynamically significant. Electronically Signed   By: Genevie Ann M.D.   On: 05/05/2019 20:05   DG Chest Port 1 View  Result Date: 05/05/2019 CLINICAL DATA:  Tachycardia and shortness of breath EXAM: PORTABLE CHEST 1 VIEW COMPARISON:  Chest radiograph dated 07/17/2017 FINDINGS: The heart size and mediastinal contours are within normal limits. Eventration of the right hemidiaphragm is redemonstrated. Both lungs are clear. The visualized skeletal structures are unremarkable. IMPRESSION: No active disease. Electronically Signed   By: Zerita Boers M.D.   On: 05/05/2019 16:52     LOS: 2 days   Time spent: More than 50% of that time was spent in counseling and/or coordination of care.  Antonieta Pert, MD [facility]  05/07/2019, 10:53 AM

## 2019-05-08 DIAGNOSIS — K047 Periapical abscess without sinus: Secondary | ICD-10-CM | POA: Diagnosis not present

## 2019-05-08 DIAGNOSIS — E871 Hypo-osmolality and hyponatremia: Secondary | ICD-10-CM | POA: Diagnosis not present

## 2019-05-08 DIAGNOSIS — R55 Syncope and collapse: Secondary | ICD-10-CM | POA: Diagnosis not present

## 2019-05-08 DIAGNOSIS — A419 Sepsis, unspecified organism: Secondary | ICD-10-CM | POA: Diagnosis not present

## 2019-05-08 LAB — BASIC METABOLIC PANEL
Anion gap: 12 (ref 5–15)
BUN: 22 mg/dL (ref 8–23)
CO2: 25 mmol/L (ref 22–32)
Calcium: 9.5 mg/dL (ref 8.9–10.3)
Chloride: 95 mmol/L — ABNORMAL LOW (ref 98–111)
Creatinine, Ser: 1.62 mg/dL — ABNORMAL HIGH (ref 0.61–1.24)
GFR calc Af Amer: 52 mL/min — ABNORMAL LOW (ref >60)
GFR calc non Af Amer: 45 mL/min — ABNORMAL LOW (ref >60)
Glucose, Bld: 91 mg/dL (ref 70–99)
Potassium: 4.1 mmol/L (ref 3.5–5.1)
Sodium: 132 mmol/L — ABNORMAL LOW (ref 135–145)

## 2019-05-08 LAB — CBC
HCT: 45.1 % (ref 39.0–52.0)
Hemoglobin: 15 g/dL (ref 13.0–17.0)
MCH: 30.8 pg (ref 26.0–34.0)
MCHC: 33.3 g/dL (ref 30.0–36.0)
MCV: 92.6 fL (ref 80.0–100.0)
Platelets: 236 10*3/uL (ref 150–400)
RBC: 4.87 MIL/uL (ref 4.22–5.81)
RDW: 13 % (ref 11.5–15.5)
WBC: 7.2 10*3/uL (ref 4.0–10.5)
nRBC: 0 % (ref 0.0–0.2)

## 2019-05-08 LAB — GLUCOSE, CAPILLARY: Glucose-Capillary: 86 mg/dL (ref 70–99)

## 2019-05-08 MED ORDER — TRAMADOL HCL 50 MG PO TABS: 50.0000 mg | ORAL_TABLET | Freq: Four times a day (QID) | ORAL | 0 refills | Status: DC | PRN

## 2019-05-08 MED ORDER — CLINDAMYCIN HCL 300 MG PO CAPS: 300.0000 mg | ORAL_CAPSULE | Freq: Three times a day (TID) | ORAL | 0 refills | Status: AC

## 2019-05-08 NOTE — Discharge Summary (Signed)
Physician Discharge Summary  [name] BUL:[id number] DOB: Sep 14, 1955 DOA: 05/05/2019  PCP: Libby Maw, MD  Admit date: 05/05/2019 Discharge date: 05/08/2019  Admitted From: home Disposition: home Recommendations for Outpatient Follow-up:  1. Follow up with PCP in 1-2 weeks 2. Please obtain BMP/CBC in one week 3. Please follow up with dentist dr Mancel Parsons in 0ne week  Home Health:none Equipment/Devices:none Discharge Condition:stable CODE STATUS:full Diet recommendation: cardiac soft diet Brief/Interim Summary: 64 year old male history of AAA, DVT( 2012, no longer on [facility]), GERD,anxietyattack, BPH HTN, seizure disorder, cigarette smoking, presents with right facial swelling, feverandpain on right upper tooth since Friday- 05/03/19, and on 2/21 waslightheaded while coming back from the bathroom and started to sweat with near syncopal event. He also had associated vomiting x2, shortness of breath which he attributes to asthma, and had fever of 100.  In the ER noted edema of the left side of the face including under the left eye and the left cheek and the left upper lip with poor dentition and dental caries and decaying tooth in the left side mandibular area without significant intraoral edema,uvula not deviated no elevation of the floor of the mouth. Showed leukocytosis 14.5, hyponatremia sodium 125, CT scan of the face showed"1.Left facial cellulitis, possibly odontogenic with mild periapical lucency at the residual anterior left maxillary tooth. 2. There appears to be edema or fluid along the maxilla alveolar process posterior to the tooth, thus gingival or subperiosteal abscess is difficult to exclude.But no other fluid collection or complicating features.3.There is mild to moderate left side paranasal sinus inflammation although not obviously related to #1. 4.Proximal right ICA atherosclerosis which may be hemodynamically significant" Blood culture obtained treated with  vancomycin and Zosyn. EDP after discussing with the admitting physician attempted to contact dental/oral surgery at [facility]- but bothdid not have dentalorsurgery on-call, then [facility] per ED PA Phylliss Bob ' I spoke withoral surgery at [place] who states that patient does not sound like an oral surgery case, is a dental concerns".Hewas subsequently admitted.  Discharge Diagnoses:  Active Problems:   Sepsis ([place])   Hyponatremia   Sepsis secondary to dental infectionandleft facial cellulitis +/- odontogeincabscess: He was treated with vancomycin and Zosyn.  Leukocytosis resolved.  CT report was reviewed  And had discussed with dental surgery Dr. Tamala Julian 2/22-advised to cont iv antibiotics andonce cellulitis improves he can be discharged on clindamycin or Levaquin and f/u as outpatient for dental extraction soon- if does not improve-needs oral surgeon eval. Case hasbeen discussed with oral surgeon at [facility] on admission by ED provider and felt oral surgery not needed and he was admitted.  My partner discussed with oral surgeon Dr. Mancel Parsons prior to discharge and advised to continue antibiotics and to follow-up with his office within a week.   Hyponatremiasecondary to HCTZ and decreased p.o. intake.  He was treated with normal saline.  Hydrochlorothiazide was not given during the hospital stay and is being stopped at the time of discharge.  AKI on CKD stage IIIa on HCTZ enalapril.  Creatinine improved to 1.6.  Baseline appears close to 1.4-1.5.   Upon discharge she is restarted on enalapril and HCTZ on hold.    AAAstatus post graft repair.  HTN:on amlodipine, HCTZ, Benzapril at home.  Continue amlodipine and benazepril   HLDon pravastatin  Anxiety disorderon Xanax  History of asthmacontinue inhaler as needed.  GERD:continue PPI  HOZ:YYQMGNOI Flomax  Estimated body mass index is 26.51 kg/m as calculated from the following:  Height  as of this encounter: '6\' 4"'$  (1.93 m).   Weight as of this encounter: 98.8 kg.  Discharge Instructions  Discharge Instructions    Call MD for:  difficulty breathing, headache or visual disturbances   Complete by: As directed    Call MD for:  persistant nausea and vomiting   Complete by: As directed    Call MD for:  severe uncontrolled pain   Complete by: As directed    Call MD for:  temperature >100.4   Complete by: As directed    Diet - low sodium heart healthy   Complete by: As directed    Increase activity slowly   Complete by: As directed      Allergies as of 05/08/2019      Reactions   Cortisone    Fentanyl    hypotension   Sulfonamide Derivatives    REACTION: rash      Medication List    STOP taking these medications   hydrochlorothiazide 25 MG tablet Commonly known as: HYDRODIURIL     TAKE these medications   acetaminophen 325 MG tablet Commonly known as: TYLENOL Take 650 mg by mouth every 6 (six) hours as needed for moderate pain.   albuterol 108 (90 Base) MCG/ACT inhaler Commonly known as: VENTOLIN HFA Inhale 1 puff into the lungs every 6 (six) hours as needed for wheezing or shortness of breath.   ALPRAZolam 0.5 MG tablet Commonly known as: XANAX TAKE 1 TABLET BY MOUTH TWICE A DAY AS NEEDED FOR ANXIETY What changed: See the new instructions.   amLODipine 10 MG tablet Commonly known as: NORVASC TAKE 1 TABLET BY MOUTH EVERY DAY   Aspirin Low Dose 81 MG EC tablet Generic drug: aspirin TAKE 1 TABLET BY MOUTH EVERY DAY What changed:   how much to take  Another medication with the same name was removed. Continue taking this medication, and follow the directions you see here.   benazepril 40 MG tablet Commonly known as: LOTENSIN Take 1 tablet (40 mg total) by mouth daily.   multivitamin tablet Take 1 tablet by mouth daily.   omeprazole 20 MG capsule Commonly known as: PRILOSEC TAKE 1 CAPSULE BY MOUTH EVERY DAY What changed: how much to take    pravastatin 40 MG tablet Commonly known as: PRAVACHOL TAKE 2 TABLETS BY MOUTH EVERY DAY   Spiriva HandiHaler 18 MCG inhalation capsule Generic drug: tiotropium INHALE 1 CAPSULE VIA HANDIHALER ONCE DAILY AT THE SAME TIME EVERY DAY What changed: See the new instructions.   tamsulosin 0.4 MG Caps capsule Commonly known as: FLOMAX TAKE 1 CAPSULE BY MOUTH EVERY DAY What changed: when to take this   traMADol 50 MG tablet Commonly known as: ULTRAM Take 1 tablet (50 mg total) by mouth every 6 (six) hours as needed. What changed: reasons to take this      Follow-up Information    Drab, Larkin Ina, DMD Follow up in 2 day(s).   Specialty: Dentistry Why: call office for visit w/i 1 wk Contact information: [address] [place] [address] [phone]        Libby Maw, MD Follow up in 1 week(s).   Specialty: Family Medicine Contact information: [place] [address] [phone]          Allergies  Allergen Reactions  . Cortisone   . Fentanyl     hypotension  . Sulfonamide Derivatives     REACTION: rash    Consultations: Dental surgery  Discussed with oral surgery at [facility] over the phone by ED physician   Procedures/Studies: CT Maxillofacial W Contrast  Result Date: 05/05/2019 CLINICAL DATA:  64 year old male with suspected dental infection, left upper front tooth. EXAM: CT MAXILLOFACIAL WITH CONTRAST TECHNIQUE: Multidetector CT imaging of the maxillofacial structures was performed with intravenous contrast. Multiplanar CT image reconstructions were also generated. CONTRAST:  77m OMNIPAQUE IOHEXOL 300 MG/ML  SOLN COMPARISON:  Head CT 08/31/2016. FINDINGS: Osseous: Intact mandible. Multiple areas of absent dentition bilaterally. A left maxillary residual canine or anterior bicuspid is noted with mild periapical lucency (series 3, image 47 and series 7, image 28). On the right side there is more severe periapical lucency  at the anterior right maxillary molar. Otherwise the maxilla appears intact. Intact zygoma, nasal bones, central skull base. Visible cervical vertebrae also appear intact with degenerative changes. Orbits: Orbital walls intact. Orbits soft tissues appear symmetric and within normal limits. Sinuses: Up to moderate left side paranasal sinus mucosal thickening at the frontoethmoidal recess, ethmoids and left maxillary sinus. No sinus fluid levels. Contralateral right paranasal sinuses are largely clear. Tympanic cavities and mastoids are clear. Soft tissues: Asymmetric left face soft tissue swelling and stranding including the left buccal space and lateral to the left mandible (series 2, image 76). There does appear to be subperiosteal fluid along the left maxilla alveolar process as seen on series 2, image 54 and series 6, image 34, although this is somewhat posterior to the anterior left maxillary tooth. No soft tissue gas. No other soft tissue fluid is evident. The left sublingual and submandibular spaces are spared. There is mild thickening of the left platysma. The left masticator and parotid spaces also appear spared. Negative visible larynx, pharynx, parapharyngeal and retropharyngeal spaces. No cervical lymphadenopathy. Grossly patent major vascular structures in the neck and at the skull base with moderate to advanced proximal right ICA atherosclerosis. The left ICA siphon is also heavily calcified. Limited intracranial: Negative. IMPRESSION: 1. Left facial cellulitis, possibly odontogenic with mild periapical lucency at the residual anterior left maxillary tooth. 2. There appears to be edema or fluid along the maxilla alveolar process posterior to the tooth, thus gingival or subperiosteal abscess is difficult to exclude. But no other fluid collection or complicating features. 3. There is mild to moderate left side paranasal sinus inflammation although not obviously related to #1. 4. Proximal right ICA  atherosclerosis which may be hemodynamically significant. Electronically Signed   By: HGenevie AnnM.D.   On: 05/05/2019 20:05   DG Chest Port 1 View  Result Date: 05/05/2019 CLINICAL DATA:  Tachycardia and shortness of breath EXAM: PORTABLE CHEST 1 VIEW COMPARISON:  Chest radiograph dated 07/17/2017 FINDINGS: The heart size and mediastinal contours are within normal limits. Eventration of the right hemidiaphragm is redemonstrated. Both lungs are clear. The visualized skeletal structures are unremarkable. IMPRESSION: No active disease. Electronically Signed   By: TZerita BoersM.D.   On: 05/05/2019 16:52    (Echo, Carotid, EGD, Colonoscopy, ERCP)    Subjective:  Resting in bed anxious to go home tolerating p.o. intake no shortness of breath Discharge Exam: Vitals:   05/07/19 2145 05/08/19 0430  BP: (!) 152/88 (!) 136/96  Pulse: 75 79  Resp: 20 18  Temp: (!) 97.5 F (36.4 C) (!) 97.3 F (36.3 C)  SpO2: 92% 94%   Vitals:   05/07/19 0900 05/07/19 1322 05/07/19 2145 05/08/19 0430  BP: (!) 161/88 (!) 152/81 (!) 152/88 (!) 136/96  Pulse: 83 83 75  79  Resp: '16 16 20 18  '$ Temp: (!) 97.5 F (36.4 C) (!) 97.5 F (36.4 C) (!) 97.5 F (36.4 C) (!) 97.3 F (36.3 C)  TempSrc: Oral Oral Oral Oral  SpO2: 95% 92% 92% 94%  Weight:      Height:        General: Pt is alert, awake, not in acute distress Decreased facial swelling Cardiovascular: RRR, S1/S2 +, no rubs, no gallops Respiratory: CTA bilaterally, no wheezing, no rhonchi Abdominal: Soft, NT, ND, bowel sounds + Extremities: no edema, no cyanosis    The results of significant diagnostics from this hospitalization (including imaging, microbiology, ancillary and laboratory) are listed below for reference.     Microbiology: Recent Results (from the past 240 hour(s))  Culture, blood (routine x 2)     Status: None (Preliminary result)   Collection Time: 05/05/19  4:25 PM   Specimen: BLOOD  Result Value Ref Range Status   Specimen  Description BLOOD LEFT ANTECUBITAL  Final   Special Requests   Final    BOTTLES DRAWN AEROBIC AND ANAEROBIC Blood Culture adequate volume Performed at [facility], [address] [place], [place] [address]    Culture   Final    NO GROWTH 2 DAYS Performed at [facility], [place] [address] [place], [place] [address]    Report Status PENDING  Incomplete  Blood Culture (routine x 2)     Status: None (Preliminary result)   Collection Time: 05/05/19  4:45 PM   Specimen: BLOOD LEFT FOREARM  Result Value Ref Range Status   Specimen Description   Final    BLOOD LEFT FOREARM Performed at [facility], [address] [place], [place] [address]    Special Requests   Final    BOTTLES DRAWN AEROBIC ONLY Blood Culture adequate volume Performed at [facility], [address] [place], [place] [address]    Culture   Final    NO GROWTH 2 DAYS Performed at [facility], [place] [address] [place], [place] [address]    Report Status PENDING  Incomplete  SARS Coronavirus 2 Ag (30 min TAT) - Nasal Swab (BD Veritor Kit)     Status: None   Collection Time: 05/05/19  4:51 PM   Specimen: Nasal Swab (BD Veritor Kit)  Result Value Ref Range Status   SARS Coronavirus 2 Ag NEGATIVE NEGATIVE Final    Comment: (NOTE) SARS-CoV-2 antigen NOT DETECTED.  Negative results are presumptive.  Negative results do not preclude SARS-CoV-2 infection and should not be used as the sole basis for treatment or other patient management decisions, including infection  control decisions, particularly in the presence of clinical signs and  symptoms consistent with COVID-19, or in those who have been in contact with the virus.  Negative results must be combined with clinical observations, patient history, and epidemiological information. The expected result is Negative. Fact Sheet for Patients: PodPark.tn Fact Sheet for Healthcare  Providers: GiftContent.is This test is not yet approved or cleared by the [place] FDA and  has been authorized for detection and/or diagnosis of SARS-CoV-2 by FDA under an Emergency Use Authorization (EUA).  This EUA will remain in effect (meaning this test can be used) for the duration of  the COVID-19 de claration under Section 564(b)(1) of the Act, 21 U.S.C. section 360bbb-3(b)(1), unless the authorization is terminated or revoked sooner. Performed at [facility], [address] [place], [place] [address]   Respiratory Panel by RT PCR (Flu A&B, Covid) - Nasopharyngeal Swab     Status: None   Collection Time: 05/05/19  6:14 PM   Specimen: Nasopharyngeal Swab  Result Value Ref Range Status   SARS Coronavirus 2 by RT PCR NEGATIVE NEGATIVE Final    Comment: (NOTE) SARS-CoV-2 target nucleic acids are NOT DETECTED. The SARS-CoV-2 RNA is generally detectable in upper respiratoy specimens during the acute phase of infection. The lowest concentration of SARS-CoV-2 viral copies this assay can detect is 131 copies/mL. A negative result does not preclude SARS-Cov-2 infection and should not be used as the sole basis for treatment or other patient management decisions. A negative result may occur with  improper specimen collection/handling, submission of specimen other than nasopharyngeal swab, presence of viral mutation(s) within the areas targeted by this assay, and inadequate number of viral copies (<131 copies/mL). A negative result must be combined with clinical observations, patient history, and epidemiological information. The expected result is Negative. Fact Sheet for Patients:  PinkCheek.be Fact Sheet for Healthcare Providers:  GravelBags.it This test is not yet ap proved or cleared by the [place] FDA and  has been authorized for detection and/or diagnosis of SARS-CoV-2  by FDA under an Emergency Use Authorization (EUA). This EUA will remain  in effect (meaning this test can be used) for the duration of the COVID-19 declaration under Section 564(b)(1) of the Act, 21 U.S.C. section 360bbb-3(b)(1), unless the authorization is terminated or revoked sooner.    Influenza A by PCR NEGATIVE NEGATIVE Final   Influenza B by PCR NEGATIVE NEGATIVE Final    Comment: (NOTE) The Xpert Xpress SARS-CoV-2/FLU/RSV assay is intended as an aid in  the diagnosis of influenza from Nasopharyngeal swab specimens and  should not be used as a sole basis for treatment. Nasal washings and  aspirates are unacceptable for Xpert Xpress SARS-CoV-2/FLU/RSV  testing. Fact Sheet for Patients: PinkCheek.be Fact Sheet for Healthcare Providers: GravelBags.it This test is not yet approved or cleared by the [place] FDA and  has been authorized for detection and/or diagnosis of SARS-CoV-2 by  FDA under an Emergency Use Authorization (EUA). This EUA will remain  in effect (meaning this test can be used) for the duration of the  Covid-19 declaration under Section 564(b)(1) of the Act, 21  U.S.C. section 360bbb-3(b)(1), unless the authorization is  terminated or revoked. Performed at [facility], [address] [place], [place] [address]   Urine culture     Status: None   Collection Time: 05/05/19  8:23 PM   Specimen: In/Out Cath Urine  Result Value Ref Range Status   Specimen Description   Final    IN/OUT CATH URINE Performed at [facility], [address] [place], [place] [address]    Special Requests   Final    NONE Performed at [facility], [address] [place], [place] [address]    Culture   Final    NO GROWTH Performed at [facility], [place] [address], [place], [place] [address]    Report Status 05/06/2019 FINAL  Final  MRSA PCR Screening     Status: None    Collection Time: 05/06/19  8:59 AM   Specimen: Nasal Mucosa; Nasopharyngeal  Result Value Ref Range Status   MRSA by PCR NEGATIVE NEGATIVE Final    Comment:        The GeneXpert MRSA Assay (FDA approved for NASAL  specimens only), is one component of a comprehensive MRSA colonization surveillance program. It is not intended to diagnose MRSA infection nor to guide or monitor treatment for MRSA infections. Performed at [facility], [place] [address] [place], [place] [address]      Labs: BNP (last 3 results) No results for input(s): BNP in the last 8760 hours. Basic Metabolic Panel: Recent Labs  Lab 05/06/19 1150 05/06/19 1620 05/06/19 2043 05/07/19 0415 05/08/19 0453  NA 129* 130* 130* 131* 132*  K 3.9 3.7 3.5 3.8 4.1  CL 94* 94* 94* 96* 95*  CO2 '22 22 23 23 25  '$ GLUCOSE 92 87 83 83 91  BUN 23 24* 25* 25* 22  CREATININE 1.63* 1.63* 1.79* 1.67* 1.62*  CALCIUM 9.3 9.3 9.1 9.5 9.5   Liver Function Tests: Recent Labs  Lab 05/05/19 1634  AST 22  ALT 20  ALKPHOS 44  BILITOT 0.8  PROT 8.7*  ALBUMIN 4.4   No results for input(s): LIPASE, AMYLASE in the last 168 hours. No results for input(s): AMMONIA in the last 168 hours. CBC: Recent Labs  Lab 05/05/19 1634 05/06/19 0819 05/07/19 0415 05/08/19 0453  WBC 14.5* 12.3* 9.4 7.2  NEUTROABS 11.2*  --   --   --   HGB 16.9 14.9 15.9 15.0  HCT 49.8 43.8 47.8 45.1  MCV 91.0 92.6 92.1 92.6  PLT 199 186 221 236   Cardiac Enzymes: No results for input(s): CKTOTAL, CKMB, CKMBINDEX, TROPONINI in the last 168 hours. BNP: Invalid input(s): POCBNP CBG: Recent Labs  Lab 05/08/19 0712  GLUCAP 86   D-Dimer No results for input(s): DDIMER in the last 72 hours. Hgb A1c No results for input(s): HGBA1C in the last 72 hours. Lipid Profile No results for input(s): CHOL, HDL, LDLCALC, TRIG, CHOLHDL, LDLDIRECT in the last 72 hours. Thyroid function studies No results for input(s): TSH, T4TOTAL, T3FREE,  THYROIDAB in the last 72 hours.  Invalid input(s): FREET3 Anemia work up No results for input(s): VITAMINB12, FOLATE, FERRITIN, TIBC, IRON, RETICCTPCT in the last 72 hours. Urinalysis    Component Value Date/Time   COLORURINE YELLOW 05/05/2019 2023   APPEARANCEUR CLEAR 05/05/2019 2023   APPEARANCEUR Clear 05/17/2013 1506   LABSPEC 1.010 05/05/2019 2023   LABSPEC 1.031 05/17/2013 1506   PHURINE 6.5 05/05/2019 2023   GLUCOSEU NEGATIVE 05/05/2019 2023   GLUCOSEU Negative 05/17/2013 1506   HGBUR TRACE (A) 05/05/2019 2023   HGBUR negative 04/07/2009 0950   BILIRUBINUR NEGATIVE 05/05/2019 2023   BILIRUBINUR neg 06/23/2016 1529   BILIRUBINUR Negative 05/17/2013 1506   KETONESUR NEGATIVE 05/05/2019 2023   PROTEINUR NEGATIVE 05/05/2019 2023   UROBILINOGEN 0.2 06/23/2016 1529   UROBILINOGEN 0.2 04/07/2009 0950   NITRITE NEGATIVE 05/05/2019 2023   LEUKOCYTESUR NEGATIVE 05/05/2019 2023   LEUKOCYTESUR Negative 05/17/2013 1506   Sepsis Labs Invalid input(s): PROCALCITONIN,  WBC,  LACTICIDVEN Microbiology Recent Results (from the past 240 hour(s))  Culture, blood (routine x 2)     Status: None (Preliminary result)   Collection Time: 05/05/19  4:25 PM   Specimen: BLOOD  Result Value Ref Range Status   Specimen Description BLOOD LEFT ANTECUBITAL  Final   Special Requests   Final    BOTTLES DRAWN AEROBIC AND ANAEROBIC Blood Culture adequate volume Performed at [facility], [address] [place], [place] [address]    Culture   Final    NO GROWTH 2 DAYS Performed at [facility], [place],  [place] [address]    Report Status PENDING  Incomplete  Blood Culture (routine x 2)     Status: None (Preliminary result)   Collection Time: 05/05/19  4:45 PM   Specimen: BLOOD LEFT FOREARM  Result Value Ref Range Status   Specimen Description   Final    BLOOD LEFT FOREARM Performed at [facility], [address] [place], [place] [address]     Special Requests   Final    BOTTLES DRAWN AEROBIC ONLY Blood Culture adequate volume Performed at [facility], [address] [place], [place] [address]    Culture   Final    NO GROWTH 2 DAYS Performed at [facility], [place] [address], [place], [place] [address]    Report Status PENDING  Incomplete  SARS Coronavirus 2 Ag (30 min TAT) - Nasal Swab (BD Veritor Kit)     Status: None   Collection Time: 05/05/19  4:51 PM   Specimen: Nasal Swab (BD Veritor Kit)  Result Value Ref Range Status   SARS Coronavirus 2 Ag NEGATIVE NEGATIVE Final    Comment: (NOTE) SARS-CoV-2 antigen NOT DETECTED.  Negative results are presumptive.  Negative results do not preclude SARS-CoV-2 infection and should not be used as the sole basis for treatment or other patient management decisions, including infection  control decisions, particularly in the presence of clinical signs and  symptoms consistent with COVID-19, or in those who have been in contact with the virus.  Negative results must be combined with clinical observations, patient history, and epidemiological information. The expected result is Negative. Fact Sheet for Patients: PodPark.tn Fact Sheet for Healthcare Providers: GiftContent.is This test is not yet approved or cleared by the [place] FDA and  has been authorized for detection and/or diagnosis of SARS-CoV-2 by FDA under an Emergency Use Authorization (EUA).  This EUA will remain in effect (meaning this test can be used) for the duration of  the COVID-19 de claration under Section 564(b)(1) of the Act, 21 U.S.C. section 360bbb-3(b)(1), unless the authorization is terminated or revoked sooner. Performed at [facility], [address] [place], [place] [address]   Respiratory Panel by RT PCR (Flu A&B, Covid) - Nasopharyngeal Swab     Status: None   Collection Time: 05/05/19  6:14 PM   Specimen:  Nasopharyngeal Swab  Result Value Ref Range Status   SARS Coronavirus 2 by RT PCR NEGATIVE NEGATIVE Final    Comment: (NOTE) SARS-CoV-2 target nucleic acids are NOT DETECTED. The SARS-CoV-2 RNA is generally detectable in upper respiratoy specimens during the acute phase of infection. The lowest concentration of SARS-CoV-2 viral copies this assay can detect is 131 copies/mL. A negative result does not preclude SARS-Cov-2 infection and should not be used as the sole basis for treatment or other patient management decisions. A negative result may occur with  improper specimen collection/handling, submission of specimen other than nasopharyngeal swab, presence of viral mutation(s) within the areas targeted by this assay, and inadequate number of viral copies (<131 copies/mL). A negative result must be combined with clinical observations, patient history, and epidemiological information. The expected result is Negative. Fact Sheet for Patients:  PinkCheek.be Fact Sheet for Healthcare Providers:  GravelBags.it This test is not yet ap proved or cleared by the [place] FDA and  has been authorized for detection and/or diagnosis of SARS-CoV-2 by FDA under an Emergency Use Authorization (EUA). This EUA will remain  in effect (meaning this  test can be used) for the duration of the COVID-19 declaration under Section 564(b)(1) of the Act, 21 U.S.C. section 360bbb-3(b)(1), unless the authorization is terminated or revoked sooner.    Influenza A by PCR NEGATIVE NEGATIVE Final   Influenza B by PCR NEGATIVE NEGATIVE Final    Comment: (NOTE) The Xpert Xpress SARS-CoV-2/FLU/RSV assay is intended as an aid in  the diagnosis of influenza from Nasopharyngeal swab specimens and  should not be used as a sole basis for treatment. Nasal washings and  aspirates are unacceptable for Xpert Xpress SARS-CoV-2/FLU/RSV  testing. Fact Sheet for  Patients: PinkCheek.be Fact Sheet for Healthcare Providers: GravelBags.it This test is not yet approved or cleared by the [place] FDA and  has been authorized for detection and/or diagnosis of SARS-CoV-2 by  FDA under an Emergency Use Authorization (EUA). This EUA will remain  in effect (meaning this test can be used) for the duration of the  Covid-19 declaration under Section 564(b)(1) of the Act, 21  U.S.C. section 360bbb-3(b)(1), unless the authorization is  terminated or revoked. Performed at [facility], [address] [place], [place] [address]   Urine culture     Status: None   Collection Time: 05/05/19  8:23 PM   Specimen: In/Out Cath Urine  Result Value Ref Range Status   Specimen Description   Final    IN/OUT CATH URINE Performed at [facility], [address] [place], [place] [address]    Special Requests   Final    NONE Performed at [facility], [address] [place], [place] [address]    Culture   Final    NO GROWTH Performed at [facility], [address], [place], [place] [address]    Report Status 05/06/2019 FINAL  Final  MRSA PCR Screening     Status: None   Collection Time: 05/06/19  8:59 AM   Specimen: Nasal Mucosa; Nasopharyngeal  Result Value Ref Range Status   MRSA by PCR NEGATIVE NEGATIVE Final    Comment:        The GeneXpert MRSA Assay (FDA approved for NASAL specimens only), is one component of a comprehensive MRSA colonization surveillance program. It is not intended to diagnose MRSA infection nor to guide or monitor treatment for MRSA infections. Performed at [facility], [place] [address] [place], [place] [address]      Time coordinating discharge: 34 minutes  SIGNED:   Georgette Shell, MD  [facility] 05/08/2019, 9:51 AM Pager   If 7PM-7AM, please contact  night-coverage [email or web address] Password [email or web address]

## 2019-05-08 NOTE — Telephone Encounter (Signed)
Called patient to schedule follow appointment on medications, no answer unable to leave message. Sent message via Mychart asking patient to give [place] a call to schedule a follow up appointment before time of next refill.

## 2019-05-14 LAB — CULTURE, BLOOD (ROUTINE X 2)
Culture: NO GROWTH
Culture: NO GROWTH
Special Requests: ADEQUATE
Special Requests: ADEQUATE

## 2019-05-20 DIAGNOSIS — K122 Cellulitis and abscess of mouth: Secondary | ICD-10-CM | POA: Diagnosis not present

## 2019-05-20 DIAGNOSIS — K0262 Dental caries on smooth surface penetrating into dentin: Secondary | ICD-10-CM | POA: Diagnosis not present

## 2019-05-21 ENCOUNTER — Other Ambulatory Visit: Payer: Self-pay | Admitting: Family Medicine

## 2019-05-21 DIAGNOSIS — F4321 Adjustment disorder with depressed mood: Secondary | ICD-10-CM

## 2019-05-21 DIAGNOSIS — J439 Emphysema, unspecified: Secondary | ICD-10-CM

## 2019-05-21 DIAGNOSIS — J4520 Mild intermittent asthma, uncomplicated: Secondary | ICD-10-CM

## 2019-05-22 ENCOUNTER — Telehealth: Payer: Self-pay

## 2019-05-22 NOTE — Telephone Encounter (Signed)
Dr. Ethelene Hal said that pt has to schedule an appointment for anymore fills/plz schedule with pt/thx dmf

## 2019-05-22 NOTE — Telephone Encounter (Signed)
Sent a message to admin team to please contact and schedule pt/thx dmf

## 2019-05-22 NOTE — Telephone Encounter (Signed)
Pt aware and will call back to reschedule

## 2019-05-24 ENCOUNTER — Other Ambulatory Visit: Payer: Self-pay | Admitting: Family Medicine

## 2019-05-24 DIAGNOSIS — I10 Essential (primary) hypertension: Secondary | ICD-10-CM

## 2019-05-24 DIAGNOSIS — E782 Mixed hyperlipidemia: Secondary | ICD-10-CM

## 2019-05-24 DIAGNOSIS — I739 Peripheral vascular disease, unspecified: Secondary | ICD-10-CM

## 2019-05-27 NOTE — Telephone Encounter (Signed)
Patient aware Rx sent to pharmacy. Per patient he just recently had surgery he will check his schedule and give [place] a call back to schedule follow up appointment.

## 2019-05-28 ENCOUNTER — Encounter: Payer: Self-pay | Admitting: Family Medicine

## 2019-06-12 ENCOUNTER — Other Ambulatory Visit: Payer: Self-pay

## 2019-06-13 ENCOUNTER — Ambulatory Visit (INDEPENDENT_AMBULATORY_CARE_PROVIDER_SITE_OTHER): Payer: BC Managed Care – PPO | Admitting: Family Medicine

## 2019-06-13 ENCOUNTER — Encounter: Payer: Self-pay | Admitting: Family Medicine

## 2019-06-13 VITALS — BP 138/80 | HR 100 | Temp 97.3°F | Ht 76.0 in | Wt 223.8 lb

## 2019-06-13 DIAGNOSIS — K219 Gastro-esophageal reflux disease without esophagitis: Secondary | ICD-10-CM

## 2019-06-13 DIAGNOSIS — I739 Peripheral vascular disease, unspecified: Secondary | ICD-10-CM

## 2019-06-13 DIAGNOSIS — F064 Anxiety disorder due to known physiological condition: Secondary | ICD-10-CM

## 2019-06-13 DIAGNOSIS — N183 Chronic kidney disease, stage 3 unspecified: Secondary | ICD-10-CM

## 2019-06-13 DIAGNOSIS — N401 Enlarged prostate with lower urinary tract symptoms: Secondary | ICD-10-CM

## 2019-06-13 DIAGNOSIS — R972 Elevated prostate specific antigen [PSA]: Secondary | ICD-10-CM | POA: Diagnosis not present

## 2019-06-13 DIAGNOSIS — R3911 Hesitancy of micturition: Secondary | ICD-10-CM

## 2019-06-13 DIAGNOSIS — E782 Mixed hyperlipidemia: Secondary | ICD-10-CM

## 2019-06-13 DIAGNOSIS — J439 Emphysema, unspecified: Secondary | ICD-10-CM

## 2019-06-13 DIAGNOSIS — I10 Essential (primary) hypertension: Secondary | ICD-10-CM

## 2019-06-13 DIAGNOSIS — F4321 Adjustment disorder with depressed mood: Secondary | ICD-10-CM | POA: Diagnosis not present

## 2019-06-13 MED ORDER — OMEPRAZOLE 20 MG PO CPDR: DELAYED_RELEASE_CAPSULE | ORAL | 1 refills | Status: DC

## 2019-06-13 MED ORDER — ALPRAZOLAM 0.5 MG PO TABS: ORAL_TABLET | ORAL | 0 refills | Status: DC

## 2019-06-13 MED ORDER — ASPIRIN 81 MG PO TBEC: 81.0000 mg | DELAYED_RELEASE_TABLET | Freq: Every day | ORAL | 1 refills | Status: DC

## 2019-06-13 MED ORDER — HYDROCHLOROTHIAZIDE 25 MG PO TABS: 25.0000 mg | ORAL_TABLET | Freq: Every day | ORAL | 0 refills | Status: DC

## 2019-06-13 MED ORDER — AMLODIPINE BESYLATE 10 MG PO TABS: 10.0000 mg | ORAL_TABLET | Freq: Every day | ORAL | 1 refills | Status: DC

## 2019-06-13 MED ORDER — PAROXETINE HCL 10 MG PO TABS: 10.0000 mg | ORAL_TABLET | Freq: Every day | ORAL | 2 refills | Status: DC

## 2019-06-13 MED ORDER — BENAZEPRIL HCL 40 MG PO TABS: 40.0000 mg | ORAL_TABLET | Freq: Every day | ORAL | 0 refills | Status: DC

## 2019-06-13 MED ORDER — PRAVASTATIN SODIUM 40 MG PO TABS: 80.0000 mg | ORAL_TABLET | Freq: Every day | ORAL | 1 refills | Status: DC

## 2019-06-13 MED ORDER — TAMSULOSIN HCL 0.4 MG PO CAPS: 0.4000 mg | ORAL_CAPSULE | Freq: Every day | ORAL | 1 refills | Status: DC

## 2019-06-13 MED ORDER — ALBUTEROL SULFATE HFA 108 (90 BASE) MCG/ACT IN AERS: 1.0000 | INHALATION_SPRAY | Freq: Four times a day (QID) | RESPIRATORY_TRACT | 0 refills | Status: DC | PRN

## 2019-06-13 NOTE — Progress Notes (Addendum)
Established Patient Office Visit  Subjective:  Patient ID: [name], male    DOB: [birth year]  Age: 64 y.o. MRN: [id number]  CC:  Chief Complaint  Patient presents with  . Hospitalization Follow-up    Need a refill on medication.      HPI [name] presents for hospital follow-up status post severe dental infection with near sepsis.  Tooth is doing much better he has had follow-up with his dentist.  Reports that Spiriva does not seem to be helping.  He knows that I am concerned about his daily albuterol use.  Continues to take benazepril, amlodipine and HCTZ for control of his blood pressure.  Continues taking Pravachol for his cholesterol.  Continues with Flomax without issue.  Or urinary hesitancy.  He has been on daily Xanax for years for his anxiety.  Usage has increased after his wife's death.  He had his second Covid vaccine yesterday.  Hopes to retire next year.  Past Medical History:  Diagnosis Date  . AAA (abdominal aortic aneurysm) ([place])   . AAA (abdominal aortic aneurysm) without rupture ([place])   . Arthritis   . BPH (benign prostatic hyperplasia)   . Claudication [facility])    lower extremities  . Claustrophobia    panic attacks  . COLONIC POLYPS, HX OF 05/02/2007  . DVT (deep venous thrombosis) ([place])   . Dyspnea    with exertion  . GERD 11/08/2006  . HYPERCHOLESTEROLEMIA, SEVERE 05/02/2007  . HYPERTENSION 11/08/2006  . LOW BACK PAIN 11/22/2007  . 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 PONV (postoperative nausea and vomiting)   . Seizures ([place])    from concussion several months ago has been having headache  . Weak urine stream    Slow to get started    Past Surgical History:  Procedure Laterality Date  . ABDOMINAL AORTIC ENDOVASCULAR STENT GRAFT N/A 07/17/2017   Procedure: ABDOMINAL AORTIC ENDOVASCULAR STENT GRAFT REPAIR WITH ILIAC BRANCH DEVICE;  Surgeon: [name], MD;  Location: [facility] OR;  Service: Vascular;  Laterality: N/A;  . ANGIOPLASTY  09/20/10     Rt AK to tibioperoneal trunk BPG w/ vein patch angioplasty of tibioperoneal trunk   . carpel tunnel surgery  per pt. 6-7 years ago   bilateral  (done 2 weeks apart)  . FOOT SURGERY     right x2  . MOHS SURGERY     lower lip  . PR VEIN BYPASS GRAFT,AORTO-FEM-POP      Family History  Problem Relation Age of Onset  . Adrenal disorder Brother   . Hyperlipidemia Brother   . Hypertension Brother   . Heart disease Brother        Heart Disease before age 36  . Heart attack Brother   . Heart disease Mother        Heart Disease before age 41  . Hyperlipidemia Mother   . Hypertension Mother   . Heart attack Mother   . Heart disease Father        Heart Disease before age 31  . Hyperlipidemia Father   . Heart attack Father   . Pulmonary embolism Sister     Social History   Socioeconomic History  . Marital status: Married    Spouse name: Not on file  . Number of children: Not on file  . Years of education: Not on file  . Highest education level: Not on file  Occupational History  . Not on file  Tobacco Use  . Smoking  status: Current Every Day Smoker    Packs/day: 1.00    Years: 40.00    Pack years: 40.00    Types: Cigarettes  . Smokeless tobacco: Never Used  Substance and Sexual Activity  . Alcohol use: No    Alcohol/week: 0.0 standard drinks  . Drug use: No  . Sexual activity: Not on file  Other Topics Concern  . Not on file  Social History Narrative  . Not on file   Social Determinants of Health   Financial Resource Strain:   . Difficulty of Paying Living Expenses:   Food Insecurity:   . Worried About Charity fundraiser in the Last Year:   . Arboriculturist in the Last Year:   Transportation Needs:   . Film/video editor (Medical):   Marland Kitchen Lack of Transportation (Non-Medical):   Physical Activity:   . Days of Exercise per Week:   . Minutes of Exercise per Session:   Stress:   . Feeling of Stress :   Social Connections:   . Frequency of Communication  with Friends and Family:   . Frequency of Social Gatherings with Friends and Family:   . Attends Religious Services:   . Active Member of Clubs or Organizations:   . Attends Archivist Meetings:   Marland Kitchen Marital Status:   Intimate Partner Violence:   . Fear of Current or Ex-Partner:   . Emotionally Abused:   Marland Kitchen Physically Abused:   . Sexually Abused:     Outpatient Medications Prior to Visit  Medication Sig Dispense Refill  . acetaminophen (TYLENOL) 325 MG tablet Take 650 mg by mouth every 6 (six) hours as needed for moderate pain.    . Multiple Vitamin (MULTIVITAMIN) tablet Take 1 tablet by mouth daily.      Marland Kitchen SPIRIVA HANDIHALER 18 MCG inhalation capsule INHALE 1 CAPSULE VIA HANDIHALER ONCE DAILY AT THE SAME TIME EVERY DAY (Patient taking differently: Place 18 mcg into inhaler and inhale daily. ) 30 capsule 5  . albuterol (VENTOLIN HFA) 108 (90 Base) MCG/ACT inhaler INHALE 1 PUFF INTO THE LUNGS EVERY 6 (SIX) HOURS AS NEEDED FOR WHEEZING OR SHORTNESS OF BREATH. 18 g 0  . ALPRAZolam (XANAX) 0.5 MG tablet TAKE 1 TABLET BY MOUTH TWICE A DAY AS NEEDED FOR ANXIETY 60 tablet 0  . amLODipine (NORVASC) 10 MG tablet TAKE 1 TABLET BY MOUTH EVERY DAY (Patient taking differently: Take 10 mg by mouth daily. ) 90 tablet 1  . ASPIRIN LOW DOSE 81 MG EC tablet TAKE 1 TABLET BY MOUTH EVERY DAY 90 tablet 1  . benazepril (LOTENSIN) 40 MG tablet Take 1 tablet (40 mg total) by mouth daily. 90 tablet 0  . hydrochlorothiazide (HYDRODIURIL) 25 MG tablet TAKE 1 TABLET BY MOUTH EVERY DAY 90 tablet 0  . omeprazole (PRILOSEC) 20 MG capsule TAKE 1 CAPSULE BY MOUTH EVERY DAY 90 capsule 1  . pravastatin (PRAVACHOL) 40 MG tablet TAKE 2 TABLETS BY MOUTH EVERY DAY 180 tablet 1  . tamsulosin (FLOMAX) 0.4 MG CAPS capsule TAKE 1 CAPSULE BY MOUTH EVERY DAY (Patient taking differently: Take 0.4 mg by mouth daily after supper. ) 90 capsule 1  . traMADol (ULTRAM) 50 MG tablet Take 1 tablet (50 mg total) by mouth every 6 (six)  hours as needed. 20 tablet 0   No facility-administered medications prior to visit.    Allergies  Allergen Reactions  . Cortisone   . Fentanyl     hypotension  . Sulfonamide Derivatives  REACTION: rash    ROS Review of Systems  Constitutional: Negative.   HENT: Negative.   Eyes: Negative for photophobia and visual disturbance.  Respiratory: Positive for cough and shortness of breath. Negative for chest tightness and wheezing.   Cardiovascular: Negative for chest pain and palpitations.  Gastrointestinal: Negative.   Endocrine: Negative for polyphagia and polyuria.  Genitourinary: Positive for difficulty urinating. Negative for frequency and urgency.  Musculoskeletal: Negative for joint swelling and myalgias.  Skin: Negative for pallor and rash.  Allergic/Immunologic: Negative for immunocompromised state.  Neurological: Negative for tremors and speech difficulty.  Hematological: Does not bruise/bleed easily.  Psychiatric/Behavioral: Positive for dysphoric mood. The patient is nervous/anxious.       Objective:    Physical Exam  Constitutional: He is oriented to person, place, and time. He appears well-developed and well-nourished. No distress.  HENT:  Head: Normocephalic and atraumatic.  Right Ear: External ear normal.  Left Ear: External ear normal.  Eyes: Conjunctivae are normal. Right eye exhibits no discharge. Left eye exhibits no discharge. No scleral icterus.  Neck: No JVD present. No tracheal deviation present. No thyromegaly present.  Cardiovascular: Normal rate, regular rhythm and normal heart sounds.  Pulmonary/Chest: Effort normal. No stridor. He has decreased breath sounds.  Abdominal: Bowel sounds are normal.  Musculoskeletal:        General: No edema.  Lymphadenopathy:    He has no cervical adenopathy.  Neurological: He is alert and oriented to person, place, and time.  Skin: Skin is warm and dry. He is not diaphoretic.  Psychiatric: He has a normal  mood and affect. His behavior is normal.    BP 138/80 (BP Location: Left Arm, Patient Position: Sitting, Cuff Size: Normal)   Pulse 100   Temp (!) 97.3 F (36.3 C) (Temporal)   Ht 6\' 4"  (1.93 m)   Wt 223 lb 12.8 oz (101.5 kg)   SpO2 95%   BMI 27.24 kg/m  Wt Readings from Last 3 Encounters:  06/13/19 223 lb 12.8 oz (101.5 kg)  05/06/19 217 lb 13 oz (98.8 kg)  03/13/18 213 lb (96.6 kg)     Health Maintenance Due  Topic Date Due  . Hepatitis C Screening  Never done  . COLONOSCOPY  Never done    There are no preventive care reminders to display for this patient.  Lab Results  Component Value Date   TSH 1.32 01/16/2018   Lab Results  Component Value Date   WBC 7.2 05/08/2019   HGB 15.0 05/08/2019   HCT 45.1 05/08/2019   MCV 92.6 05/08/2019   PLT 236 05/08/2019   Lab Results  Component Value Date   NA 132 (L) 05/08/2019   K 4.1 05/08/2019   CO2 25 05/08/2019   GLUCOSE 91 05/08/2019   BUN 22 05/08/2019   CREATININE 1.62 (H) 05/08/2019   BILITOT 0.8 05/05/2019   ALKPHOS 44 05/05/2019   AST 22 05/05/2019   ALT 20 05/05/2019   PROT 8.7 (H) 05/05/2019   ALBUMIN 4.4 05/05/2019   CALCIUM 9.5 05/08/2019   ANIONGAP 12 05/08/2019   GFR 44.44 (L) 01/16/2018   Lab Results  Component Value Date   CHOL 180 01/16/2018   Lab Results  Component Value Date   HDL 28.90 (L) 01/16/2018   No results found for: [facility] Lab Results  Component Value Date   TRIG (H) 01/16/2018    512.0 Triglyceride is over 400; calculations on Lipids are invalid.   Lab Results  Component Value Date  CHOLHDL 6 01/16/2018   Lab Results  Component Value Date   HGBA1C 5.6 06/07/2006      Assessment & Plan:   Problem List Items Addressed This Visit      Cardiovascular and Mediastinum   Essential hypertension   Relevant Medications   amLODipine (NORVASC) 10 MG tablet   aspirin (ASPIRIN LOW DOSE) 81 MG EC tablet   benazepril (LOTENSIN) 40 MG tablet   hydrochlorothiazide  (HYDRODIURIL) 25 MG tablet   pravastatin (PRAVACHOL) 40 MG tablet   Other Relevant Orders   Basic metabolic panel   CBC   Urinalysis, Routine w reflex microscopic   Peripheral vascular disease, unspecified (HCC)   Relevant Medications   amLODipine (NORVASC) 10 MG tablet   aspirin (ASPIRIN LOW DOSE) 81 MG EC tablet   benazepril (LOTENSIN) 40 MG tablet   hydrochlorothiazide (HYDRODIURIL) 25 MG tablet   pravastatin (PRAVACHOL) 40 MG tablet   Other Relevant Orders   LDL cholesterol, direct     Respiratory   Pulmonary emphysema (HCC)   Relevant Medications   albuterol (VENTOLIN HFA) 108 (90 Base) MCG/ACT inhaler   Other Relevant Orders   Ambulatory referral to Pulmonology     Genitourinary   Benign prostatic hyperplasia with urinary hesitancy   Relevant Medications   tamsulosin (FLOMAX) 0.4 MG CAPS capsule   Other Relevant Orders   PSA   CKD (chronic kidney disease) stage 3, GFR 30-59 ml/min   Relevant Medications   benazepril (LOTENSIN) 40 MG tablet     Other   Mixed dyslipidemia   Relevant Medications   aspirin (ASPIRIN LOW DOSE) 81 MG EC tablet   pravastatin (PRAVACHOL) 40 MG tablet   Elevated PSA, less than 10 ng/ml   Relevant Orders   PSA   Grieving   Relevant Medications   ALPRAZolam (XANAX) 0.5 MG tablet   PARoxetine (PAXIL) 10 MG tablet   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 albuterol (VENTOLIN HFA) 108 (90 Base) MCG/ACT inhaler    Sig: Inhale 1 puff into the lungs every 6 (six) hours as needed for wheezing or shortness of breath.    Dispense:  18 g    Refill:  0    DX Code Needed  .  Marland Kitchen ALPRAZolam (XANAX) 0.5 MG tablet    Sig: TAKE 1 TABLET BY MOUTH TWICE A DAY AS NEEDED FOR ANXIETY    Dispense:  60 tablet    Refill:  0    Not to exceed 4 additional fills before 10/19/2019  . amLODipine  (NORVASC) 10 MG tablet    Sig: Take 1 tablet (10 mg total) by mouth daily.    Dispense:  90 tablet    Refill:  1  . aspirin (ASPIRIN LOW DOSE) 81 MG EC tablet    Sig: Take 1 tablet (81 mg total) by mouth daily. Swallow whole.    Dispense:  90 tablet    Refill:  1  . benazepril (LOTENSIN) 40 MG tablet    Sig: Take 1 tablet (40 mg total) by mouth daily.    Dispense:  90 tablet    Refill:  0  . hydrochlorothiazide (HYDRODIURIL) 25 MG tablet    Sig: Take 1 tablet (25 mg total) by mouth daily.    Dispense:  90 tablet    Refill:  0  .  omeprazole (PRILOSEC) 20 MG capsule    Sig: TAKE 1 CAPSULE BY MOUTH EVERY DAY    Dispense:  90 capsule    Refill:  1  . pravastatin (PRAVACHOL) 40 MG tablet    Sig: Take 2 tablets (80 mg total) by mouth daily.    Dispense:  180 tablet    Refill:  1  . tamsulosin (FLOMAX) 0.4 MG CAPS capsule    Sig: Take 1 capsule (0.4 mg total) by mouth daily.    Dispense:  90 capsule    Refill:  1  . PARoxetine (PAXIL) 10 MG tablet    Sig: Take 1 tablet (10 mg total) by mouth daily.    Dispense:  30 tablet    Refill:  2    Follow-up: Return in about 3 months (around 09/12/2019).   Patient agrees to give Paxil a try in addition to the Xanax.  We will start with low-dose and hopefully be able to slowly increase it. Libby Maw, MD

## 2019-06-13 NOTE — Addendum Note (Signed)
Addended by: Lynnea Ferrier on: 06/13/2019 03:49 PM   Modules accepted: Orders

## 2019-06-14 LAB — BASIC METABOLIC PANEL
BUN/Creatinine Ratio: 10 (calc) (ref 6–22)
BUN: 16 mg/dL (ref 7–25)
CO2: 23 mmol/L (ref 20–32)
Calcium: 9.5 mg/dL (ref 8.6–10.3)
Chloride: 98 mmol/L (ref 98–110)
Creat: 1.58 mg/dL — ABNORMAL HIGH (ref 0.70–1.25)
Glucose, Bld: 135 mg/dL — ABNORMAL HIGH (ref 65–99)
Potassium: 3.8 mmol/L (ref 3.5–5.3)
Sodium: 134 mmol/L — ABNORMAL LOW (ref 135–146)

## 2019-06-14 LAB — CBC
HCT: 47.8 % (ref 38.5–50.0)
Hemoglobin: 16.1 g/dL (ref 13.2–17.1)
MCH: 31.3 pg (ref 27.0–33.0)
MCHC: 33.7 g/dL (ref 32.0–36.0)
MCV: 92.8 fL (ref 80.0–100.0)
MPV: 10.6 fL (ref 7.5–12.5)
Platelets: 197 10*3/uL (ref 140–400)
RBC: 5.15 10*6/uL (ref 4.20–5.80)
RDW: 13.1 % (ref 11.0–15.0)
WBC: 7.1 10*3/uL (ref 3.8–10.8)

## 2019-06-14 LAB — URINALYSIS, ROUTINE W REFLEX MICROSCOPIC

## 2019-06-14 LAB — PSA: PSA: 4.5 ng/mL — ABNORMAL HIGH (ref <=4.0)

## 2019-06-14 LAB — LDL CHOLESTEROL, DIRECT: Direct LDL: 65 mg/dL (ref <100)

## 2019-06-20 ENCOUNTER — Inpatient Hospital Stay: Payer: BC Managed Care – PPO | Admitting: Family Medicine

## 2019-07-07 ENCOUNTER — Other Ambulatory Visit: Payer: Self-pay | Admitting: Family Medicine

## 2019-07-07 DIAGNOSIS — F4321 Adjustment disorder with depressed mood: Secondary | ICD-10-CM

## 2019-07-07 DIAGNOSIS — J439 Emphysema, unspecified: Secondary | ICD-10-CM

## 2019-07-07 DIAGNOSIS — F064 Anxiety disorder due to known physiological condition: Secondary | ICD-10-CM

## 2019-07-18 ENCOUNTER — Other Ambulatory Visit: Payer: Self-pay | Admitting: Family Medicine

## 2019-07-18 DIAGNOSIS — F4321 Adjustment disorder with depressed mood: Secondary | ICD-10-CM

## 2019-07-18 NOTE — Telephone Encounter (Signed)
Please schedule a virtual appt. With me. Next week.

## 2019-07-18 NOTE — Telephone Encounter (Signed)
Can ya'll plz sched a VV with WK next week? Thx dmf

## 2019-07-18 NOTE — Telephone Encounter (Signed)
WK-Pt has not returned call to Pulmonology/thx dmf

## 2019-07-19 ENCOUNTER — Telehealth: Payer: Self-pay | Admitting: Family Medicine

## 2019-07-19 NOTE — Telephone Encounter (Signed)
LVM to schedule follow up appointment

## 2019-08-03 ENCOUNTER — Other Ambulatory Visit: Payer: Self-pay | Admitting: Family Medicine

## 2019-08-03 DIAGNOSIS — J439 Emphysema, unspecified: Secondary | ICD-10-CM

## 2019-08-26 ENCOUNTER — Other Ambulatory Visit: Payer: Self-pay | Admitting: Family Medicine

## 2019-08-26 DIAGNOSIS — J439 Emphysema, unspecified: Secondary | ICD-10-CM

## 2019-08-26 DIAGNOSIS — F4321 Adjustment disorder with depressed mood: Secondary | ICD-10-CM

## 2019-09-16 ENCOUNTER — Other Ambulatory Visit: Payer: Self-pay | Admitting: Family Medicine

## 2019-09-16 DIAGNOSIS — E782 Mixed hyperlipidemia: Secondary | ICD-10-CM

## 2019-09-16 DIAGNOSIS — J439 Emphysema, unspecified: Secondary | ICD-10-CM

## 2019-09-16 DIAGNOSIS — I739 Peripheral vascular disease, unspecified: Secondary | ICD-10-CM

## 2019-10-06 ENCOUNTER — Other Ambulatory Visit: Payer: Self-pay | Admitting: Family Medicine

## 2019-10-06 DIAGNOSIS — J439 Emphysema, unspecified: Secondary | ICD-10-CM

## 2019-10-07 ENCOUNTER — Other Ambulatory Visit: Payer: Self-pay | Admitting: Family Medicine

## 2019-10-07 DIAGNOSIS — I10 Essential (primary) hypertension: Secondary | ICD-10-CM

## 2019-10-07 DIAGNOSIS — F4321 Adjustment disorder with depressed mood: Secondary | ICD-10-CM

## 2019-10-07 DIAGNOSIS — N183 Chronic kidney disease, stage 3 unspecified: Secondary | ICD-10-CM

## 2019-10-08 NOTE — Telephone Encounter (Signed)
For Alprazolam 0.5mg    Last office visit- 06/13/2019 Last refill-08-27-2019 Next office visit- not scheduled No UDS on file.  Dr. Ethelene Hal currently unavailable

## 2019-10-24 ENCOUNTER — Other Ambulatory Visit: Payer: Self-pay | Admitting: Family Medicine

## 2019-10-24 DIAGNOSIS — I10 Essential (primary) hypertension: Secondary | ICD-10-CM

## 2019-10-24 DIAGNOSIS — J439 Emphysema, unspecified: Secondary | ICD-10-CM

## 2019-11-17 ENCOUNTER — Other Ambulatory Visit: Payer: Self-pay | Admitting: Family Medicine

## 2019-11-17 DIAGNOSIS — J439 Emphysema, unspecified: Secondary | ICD-10-CM

## 2019-11-19 ENCOUNTER — Other Ambulatory Visit: Payer: Self-pay | Admitting: Family Medicine

## 2019-11-19 DIAGNOSIS — J439 Emphysema, unspecified: Secondary | ICD-10-CM

## 2019-11-19 DIAGNOSIS — F4321 Adjustment disorder with depressed mood: Secondary | ICD-10-CM

## 2019-11-21 NOTE — Telephone Encounter (Signed)
Patient is requesting a refill:   Alprazolam 0.5mg    LR 10/09/19, 0 rf's LOV w/Dr Ethelene Hal 06/13/19  Please advise.  Thanks,  Armandina Gemma, CMA

## 2019-11-21 NOTE — Telephone Encounter (Signed)
Will route to pcp for review I refilled in PCP's absence previously but have not seen pt

## 2019-11-22 NOTE — Telephone Encounter (Signed)
Left a message and sent pt a my chart message to find out when his next appointment is and to tell him to schedule a OV with Ethelene Hal.

## 2019-12-20 ENCOUNTER — Other Ambulatory Visit: Payer: Self-pay | Admitting: Family Medicine

## 2019-12-20 DIAGNOSIS — I10 Essential (primary) hypertension: Secondary | ICD-10-CM

## 2019-12-20 DIAGNOSIS — J439 Emphysema, unspecified: Secondary | ICD-10-CM

## 2019-12-20 DIAGNOSIS — F064 Anxiety disorder due to known physiological condition: Secondary | ICD-10-CM

## 2019-12-20 DIAGNOSIS — N183 Chronic kidney disease, stage 3 unspecified: Secondary | ICD-10-CM

## 2019-12-20 DIAGNOSIS — F4321 Adjustment disorder with depressed mood: Secondary | ICD-10-CM

## 2020-01-11 ENCOUNTER — Other Ambulatory Visit: Payer: Self-pay | Admitting: Family Medicine

## 2020-01-11 DIAGNOSIS — I739 Peripheral vascular disease, unspecified: Secondary | ICD-10-CM

## 2020-01-11 DIAGNOSIS — I10 Essential (primary) hypertension: Secondary | ICD-10-CM

## 2020-01-11 DIAGNOSIS — R3911 Hesitancy of micturition: Secondary | ICD-10-CM

## 2020-01-11 DIAGNOSIS — K219 Gastro-esophageal reflux disease without esophagitis: Secondary | ICD-10-CM

## 2020-01-13 NOTE — Telephone Encounter (Signed)
Last OV 06/13/19 Last fill for all 3 medications 06/13/19  #90/1 Patient need a follow up visit before any other refills.

## 2020-01-31 ENCOUNTER — Other Ambulatory Visit: Payer: Self-pay | Admitting: Family Medicine

## 2020-01-31 DIAGNOSIS — I739 Peripheral vascular disease, unspecified: Secondary | ICD-10-CM

## 2020-01-31 DIAGNOSIS — F4321 Adjustment disorder with depressed mood: Secondary | ICD-10-CM

## 2020-01-31 NOTE — Telephone Encounter (Signed)
Please see message and advise.  Thank you. Last OV 06/13/19 Last fill Xanax 12/20/19  #60/0 Last fill for Pravastatin 06/13/19  #180/1

## 2020-02-01 ENCOUNTER — Other Ambulatory Visit: Payer: Self-pay | Admitting: Family Medicine

## 2020-02-01 DIAGNOSIS — J439 Emphysema, unspecified: Secondary | ICD-10-CM

## 2020-03-09 ENCOUNTER — Other Ambulatory Visit: Payer: Self-pay | Admitting: Family

## 2020-03-09 ENCOUNTER — Other Ambulatory Visit: Payer: Self-pay | Admitting: Family Medicine

## 2020-03-09 DIAGNOSIS — N183 Chronic kidney disease, stage 3 unspecified: Secondary | ICD-10-CM

## 2020-03-09 DIAGNOSIS — I10 Essential (primary) hypertension: Secondary | ICD-10-CM

## 2020-03-09 DIAGNOSIS — F4321 Adjustment disorder with depressed mood: Secondary | ICD-10-CM

## 2020-03-17 ENCOUNTER — Other Ambulatory Visit: Payer: Self-pay | Admitting: Family

## 2020-03-17 DIAGNOSIS — J439 Emphysema, unspecified: Secondary | ICD-10-CM

## 2020-03-26 ENCOUNTER — Other Ambulatory Visit: Payer: Self-pay | Admitting: Family Medicine

## 2020-03-26 DIAGNOSIS — I739 Peripheral vascular disease, unspecified: Secondary | ICD-10-CM

## 2020-03-26 DIAGNOSIS — I10 Essential (primary) hypertension: Secondary | ICD-10-CM

## 2020-03-26 DIAGNOSIS — N401 Enlarged prostate with lower urinary tract symptoms: Secondary | ICD-10-CM

## 2020-03-27 ENCOUNTER — Other Ambulatory Visit: Payer: Self-pay | Admitting: Family

## 2020-03-27 DIAGNOSIS — F4321 Adjustment disorder with depressed mood: Secondary | ICD-10-CM

## 2020-04-23 ENCOUNTER — Other Ambulatory Visit: Payer: Self-pay | Admitting: Family

## 2020-04-23 ENCOUNTER — Other Ambulatory Visit: Payer: Self-pay | Admitting: Family Medicine

## 2020-04-23 DIAGNOSIS — J439 Emphysema, unspecified: Secondary | ICD-10-CM

## 2020-04-23 DIAGNOSIS — I10 Essential (primary) hypertension: Secondary | ICD-10-CM

## 2020-04-23 DIAGNOSIS — F4321 Adjustment disorder with depressed mood: Secondary | ICD-10-CM

## 2020-05-06 ENCOUNTER — Other Ambulatory Visit: Payer: Self-pay

## 2020-05-07 ENCOUNTER — Encounter: Payer: Self-pay | Admitting: Family Medicine

## 2020-05-07 ENCOUNTER — Ambulatory Visit: Payer: BC Managed Care – PPO | Admitting: Family Medicine

## 2020-05-07 VITALS — BP 142/80 | HR 88 | Temp 97.4°F | Ht 76.0 in | Wt 231.6 lb

## 2020-05-07 DIAGNOSIS — E782 Mixed hyperlipidemia: Secondary | ICD-10-CM

## 2020-05-07 DIAGNOSIS — M1990 Unspecified osteoarthritis, unspecified site: Secondary | ICD-10-CM | POA: Insufficient documentation

## 2020-05-07 DIAGNOSIS — N183 Chronic kidney disease, stage 3 unspecified: Secondary | ICD-10-CM

## 2020-05-07 DIAGNOSIS — F064 Anxiety disorder due to known physiological condition: Secondary | ICD-10-CM

## 2020-05-07 DIAGNOSIS — R3911 Hesitancy of micturition: Secondary | ICD-10-CM

## 2020-05-07 DIAGNOSIS — I739 Peripheral vascular disease, unspecified: Secondary | ICD-10-CM

## 2020-05-07 DIAGNOSIS — N401 Enlarged prostate with lower urinary tract symptoms: Secondary | ICD-10-CM

## 2020-05-07 DIAGNOSIS — I10 Essential (primary) hypertension: Secondary | ICD-10-CM

## 2020-05-07 DIAGNOSIS — J439 Emphysema, unspecified: Secondary | ICD-10-CM | POA: Diagnosis not present

## 2020-05-07 DIAGNOSIS — R972 Elevated prostate specific antigen [PSA]: Secondary | ICD-10-CM

## 2020-05-07 LAB — BASIC METABOLIC PANEL
BUN: 23 mg/dL (ref 6–23)
CO2: 27 mEq/L (ref 19–32)
Calcium: 10 mg/dL (ref 8.4–10.5)
Chloride: 94 mEq/L — ABNORMAL LOW (ref 96–112)
Creatinine, Ser: 2.01 mg/dL — ABNORMAL HIGH (ref 0.40–1.50)
GFR: 34.32 mL/min — ABNORMAL LOW (ref >60.00)
Glucose, Bld: 110 mg/dL — ABNORMAL HIGH (ref 70–99)
Potassium: 4.5 mEq/L (ref 3.5–5.1)
Sodium: 129 mEq/L — ABNORMAL LOW (ref 135–145)

## 2020-05-07 LAB — LDL CHOLESTEROL, DIRECT: Direct LDL: 77 mg/dL

## 2020-05-07 LAB — LIPID PANEL
Cholesterol: 165 mg/dL (ref 0–200)
HDL: 28.3 mg/dL — ABNORMAL LOW (ref >39.00)
Total CHOL/HDL Ratio: 6
Triglycerides: 418 mg/dL — ABNORMAL HIGH (ref 0.0–149.0)

## 2020-05-07 LAB — PSA: PSA: 4.87 ng/mL — ABNORMAL HIGH (ref 0.10–4.00)

## 2020-05-07 NOTE — Progress Notes (Signed)
[facility] [address] [place] [address] Dept: [phone] Dept Fax: [phone]  Acute Office Visit  Subjective:    Patient ID: [name], male    DOB: 01-Jun-1955, 65 y.o..   MRN: [id number]  Chief Complaint  Patient presents with   Medication Refill    Refill on medications follow up on labs. Would like note stating that patient can not go for jury duty.    History of Present Illness:  Patient is in today for reassessment of his chronic medical conditions. He notes that he has been stable. He quit using Spiriva, as he felt there was no benefit to this. He states his breathing problems arose after he had a stress test. He admits that he smokes 1 ppd of cigarettes and has since his teen years. He does not necessarily relate this to his current breathing issues.  [name] has a history of anxiety that surfaced after the death of his wife 2 years ago. She had a protracted course related to breast cancer, leading up to her death. He is managing with his anxiety, but admits he has trouble in crowded situations or close quarters. He is currently on Xanax and Paxil for this.  Mr. Ohlin has a history of HTN, managed with amlodipine, benazepril, and HCTZ. He also has hyperlipidemia and is on pravastatin.  Mr. Fennell has a history of LUTS, He states he still strains to urinate much of the time.  Mr. Burkett has had both a AAA repair and a right femoral artery bypass. He notes that he has chronic pain int he right leg and needs to change position frequently for comfort.  Mr. Macdonnell notes he has received a jury summons. He requests a letter to excuse him based on medical reasons due to his anxiety and leg pain issues.  Past Medical History: Patient Active Problem List   Diagnosis Date Noted   Arthritis 05/07/2020   Dental infection    Near syncope    Hyponatremia 05/06/2019   Sepsis ([place]) 05/05/2019   Pulmonary  emphysema ([place]) 03/11/2019   Grieving 07/27/2018   Anxiety disorder due to general medical condition 07/27/2018   Reactive airway disease 07/27/2018   Elevated PSA, less than 10 ng/ml 01/17/2018   Muscle cramps 01/17/2018   CKD (chronic kidney disease) stage 3, GFR 30-59 ml/min (HCC) 01/17/2018   Screen for colon cancer 01/16/2018   Chronic kidney disease (CKD) stage G2/A2, mildly decreased glomerular filtration rate (GFR) between 60-89 mL/min/1.73 square meter and albuminuria creatinine ratio between 30-299 mg/g 08/16/2017   AAA (abdominal aortic aneurysm) ([place]) 07/17/2017   Mixed dyslipidemia 06/01/2017   Cigarette smoker 06/01/2017   Benign prostatic hyperplasia with urinary hesitancy 09/22/2015   Abdominal aortic aneurysm ([place]) 06/12/2013   Abdominal pain, other specified site 05/22/2012   Peripheral vascular disease, unspecified ([place]) 04/26/2011   LOW BACK PAIN 11/22/2007   NEOPLASM, MALIGNANT, SKIN, 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   COLONIC POLYPS, HX OF 05/02/2007   Essential hypertension 11/08/2006   GERD 11/08/2006   Past Surgical History:  Procedure Laterality Date   ABDOMINAL AORTIC ENDOVASCULAR STENT GRAFT N/A 07/17/2017   Procedure: ABDOMINAL AORTIC ENDOVASCULAR STENT GRAFT REPAIR WITH ILIAC BRANCH DEVICE;  Surgeon: [name], MD;  Location: [facility] OR;  Service: Vascular;  Laterality: N/A;   ANGIOPLASTY  09/20/10   Rt AK to tibioperoneal trunk BPG w/ vein patch angioplasty of tibioperoneal trunk    carpel tunnel surgery  per pt. 6-7 years ago  bilateral  (done 2 weeks apart)   FOOT SURGERY     right x2   MOHS SURGERY     lower lip   PR VEIN BYPASS GRAFT,AORTO-FEM-POP     Family History  Problem Relation Age of Onset   Adrenal disorder Brother    Hyperlipidemia Brother    Hypertension Brother    Heart disease Brother        Heart Disease before age 63   Heart attack Brother    Heart disease Mother        Heart Disease before age 31    Hyperlipidemia Mother    Hypertension Mother    Heart attack Mother    Heart disease Father        Heart Disease before age 64   Hyperlipidemia Father    Heart attack Father    Pulmonary embolism Sister    Outpatient Medications Prior to Visit  Medication Sig Dispense Refill   acetaminophen (TYLENOL) 325 MG tablet Take 650 mg by mouth every 6 (six) hours as needed for moderate pain.     albuterol (VENTOLIN HFA) 108 (90 Base) MCG/ACT inhaler INHALE 1 PUFF INTO THE LUNGS EVERY 6 HOURS AS NEEDED FOR WHEEZING OR SHORTNESS OF BREATH. 6.7 each 1   ALPRAZolam (XANAX) 0.5 MG tablet TAKE 1 TABLET BY MOUTH TWICE A DAY AS NEEDED FOR ANXIETY 60 tablet 0   amLODipine (NORVASC) 10 MG tablet TAKE 1 TABLET BY MOUTH EVERY DAY 90 tablet 0   ASPIRIN LOW DOSE 81 MG EC tablet TAKE 1 TABLET BY MOUTH EVERY DAY 120 tablet 1   benazepril (LOTENSIN) 40 MG tablet TAKE 1 TABLET BY MOUTH EVERY DAY 90 tablet 0   hydrochlorothiazide (HYDRODIURIL) 25 MG tablet TAKE 1 TABLET BY MOUTH EVERY DAY 90 tablet 0   magnesium 30 MG tablet Take 30 mg by mouth 2 (two) times daily.     Multiple Vitamin (MULTIVITAMIN) tablet Take 1 tablet by mouth daily.     omeprazole (PRILOSEC) 20 MG capsule TAKE 1 CAPSULE BY MOUTH EVERY DAY 90 capsule 1   PARoxetine (PAXIL) 10 MG tablet TAKE 1 TABLET BY MOUTH EVERY DAY 90 tablet 1   pravastatin (PRAVACHOL) 40 MG tablet TAKE 2 TABLETS BY MOUTH EVERY DAY 180 tablet 1   tamsulosin (FLOMAX) 0.4 MG CAPS capsule TAKE 1 CAPSULE BY MOUTH EVERY DAY 90 capsule 0   SPIRIVA HANDIHALER 18 MCG inhalation capsule INHALE 1 CAPSULE VIA HANDIHALER ONCE DAILY AT THE SAME TIME EVERY DAY (Patient not taking: Reported on 05/07/2020) 30 capsule 1   No facility-administered medications prior to visit.   Allergies  Allergen Reactions   Cortisone    Fentanyl     hypotension   Sulfonamide Derivatives     REACTION: rash    Objective:   Today's Vitals   05/07/20 1006  BP: (!) 142/80  Pulse:  88  Temp: (!) 97.4 F (36.3 C)  TempSrc: Temporal  SpO2: 94%  Weight: 231 lb 9.6 oz (105.1 kg)  Height: 6\' 4"  (1.93 m)   Body mass index is 28.19 kg/m.   General: Well developed, well nourished. No acute distress. Lungs: Clear to auscultation bilaterally. CV: RRR without murmurs or rubs. Pulses 2+ bilaterally. Psych: Alert and oriented. Normal mood and affect.  Health Maintenance Due  Topic Date Due   Hepatitis C Screening  Never done   COLONOSCOPY (Pts 45-60yrs Insurance coverage will need to be confirmed)  Never done     Assessment & Plan:  1. Pulmonary emphysema, unspecified emphysema type ([place]) Stable with PRN albuterol use. Patient still not ready to stop tobacco use.  2. Essential hypertension Near goal. Continue current therapy.  3. Peripheral vascular disease, unspecified ([place]) On lipid management.  4. Benign prostatic hyperplasia with urinary hesitancy I offered to refer Mr. Kier to urology, but he states he doesn't want to do that at present. Continue Flomax.  5. Stage 3 chronic kidney disease, unspecified whether stage 3a or 3b CKD ([place]) Will reassess today.  - Basic metabolic panel  6. Anxiety disorder due to general medical condition On Xanax and Paxil. I do agree that he has medical issues that would make serving on a jury difficult or impossible for him. I provided him a letter requesting he be excused.  7. Elevated PSA, less than 10 ng/ml We will reassess today.  - PSA  8. Mixed dyslipidemia We will reassess levels today.  - Lipid panel    Haydee Salter, MD

## 2020-05-08 ENCOUNTER — Encounter: Payer: Self-pay | Admitting: Family Medicine

## 2020-05-20 ENCOUNTER — Other Ambulatory Visit: Payer: Self-pay | Admitting: Family Medicine

## 2020-05-20 DIAGNOSIS — F064 Anxiety disorder due to known physiological condition: Secondary | ICD-10-CM

## 2020-05-20 DIAGNOSIS — N183 Chronic kidney disease, stage 3 unspecified: Secondary | ICD-10-CM

## 2020-05-20 DIAGNOSIS — I10 Essential (primary) hypertension: Secondary | ICD-10-CM

## 2020-05-20 DIAGNOSIS — F4321 Adjustment disorder with depressed mood: Secondary | ICD-10-CM

## 2020-05-22 ENCOUNTER — Other Ambulatory Visit: Payer: Self-pay | Admitting: Family

## 2020-05-22 DIAGNOSIS — F4321 Adjustment disorder with depressed mood: Secondary | ICD-10-CM

## 2020-05-25 ENCOUNTER — Telehealth: Payer: Self-pay | Admitting: Family Medicine

## 2020-05-25 NOTE — Telephone Encounter (Signed)
Patient is calling to get a refill on his Tramadol. States that he needs it for his legs. If approved, please send to [facility] [place] and call him at [phone] to let him know that it has been sent in.

## 2020-05-26 ENCOUNTER — Telehealth: Payer: Self-pay

## 2020-05-26 NOTE — Telephone Encounter (Signed)
Received refill request for:  Alprazolam 0.5 mg  LR 04/23/20, #60, 0 rf  Tramadol 50 mg LR-d/c by Dr Andria Frames 05/06/19  LOV 05/07/20 FOV 08/05/20  Please review and advise.   Thanks.  Dm/cma

## 2020-05-27 NOTE — Telephone Encounter (Signed)
Patient notified VIA phone. Dm/cma  

## 2020-06-07 ENCOUNTER — Other Ambulatory Visit: Payer: Self-pay | Admitting: Family

## 2020-06-07 ENCOUNTER — Other Ambulatory Visit: Payer: Self-pay | Admitting: Family Medicine

## 2020-06-07 DIAGNOSIS — K219 Gastro-esophageal reflux disease without esophagitis: Secondary | ICD-10-CM

## 2020-06-07 DIAGNOSIS — N401 Enlarged prostate with lower urinary tract symptoms: Secondary | ICD-10-CM

## 2020-06-07 DIAGNOSIS — R3911 Hesitancy of micturition: Secondary | ICD-10-CM

## 2020-06-07 DIAGNOSIS — I10 Essential (primary) hypertension: Secondary | ICD-10-CM

## 2020-06-07 DIAGNOSIS — I739 Peripheral vascular disease, unspecified: Secondary | ICD-10-CM

## 2020-06-19 ENCOUNTER — Other Ambulatory Visit: Payer: Self-pay | Admitting: Family

## 2020-06-19 DIAGNOSIS — J439 Emphysema, unspecified: Secondary | ICD-10-CM

## 2020-06-23 ENCOUNTER — Other Ambulatory Visit: Payer: Self-pay | Admitting: Family

## 2020-06-23 ENCOUNTER — Other Ambulatory Visit: Payer: Self-pay | Admitting: Family Medicine

## 2020-06-23 DIAGNOSIS — F4321 Adjustment disorder with depressed mood: Secondary | ICD-10-CM

## 2020-06-23 NOTE — Telephone Encounter (Signed)
Please see message and advise.   Thanks. Dm/cma

## 2020-06-23 NOTE — Telephone Encounter (Signed)
Refill request for  Alprazolam 0.5 mg LR 05/26/20, #60, 0 rf LOV 05/07/20 FOR 08/05/20 Please review and advise.  Thanks.  Dm/cma

## 2020-07-05 ENCOUNTER — Other Ambulatory Visit: Payer: Self-pay | Admitting: Family

## 2020-07-05 DIAGNOSIS — I10 Essential (primary) hypertension: Secondary | ICD-10-CM

## 2020-07-05 DIAGNOSIS — I739 Peripheral vascular disease, unspecified: Secondary | ICD-10-CM

## 2020-07-06 ENCOUNTER — Encounter: Payer: Self-pay | Admitting: Family Medicine

## 2020-07-06 ENCOUNTER — Other Ambulatory Visit: Payer: Self-pay | Admitting: Family Medicine

## 2020-07-06 ENCOUNTER — Other Ambulatory Visit: Payer: Self-pay

## 2020-07-06 DIAGNOSIS — R3911 Hesitancy of micturition: Secondary | ICD-10-CM

## 2020-07-06 DIAGNOSIS — J439 Emphysema, unspecified: Secondary | ICD-10-CM

## 2020-07-06 DIAGNOSIS — N183 Chronic kidney disease, stage 3 unspecified: Secondary | ICD-10-CM

## 2020-07-06 DIAGNOSIS — I10 Essential (primary) hypertension: Secondary | ICD-10-CM

## 2020-07-06 DIAGNOSIS — N401 Enlarged prostate with lower urinary tract symptoms: Secondary | ICD-10-CM

## 2020-07-06 DIAGNOSIS — I739 Peripheral vascular disease, unspecified: Secondary | ICD-10-CM

## 2020-07-06 DIAGNOSIS — E782 Mixed hyperlipidemia: Secondary | ICD-10-CM

## 2020-07-06 MED ORDER — ALBUTEROL SULFATE HFA 108 (90 BASE) MCG/ACT IN AERS: INHALATION_SPRAY | RESPIRATORY_TRACT | 1 refills | Status: DC

## 2020-07-27 ENCOUNTER — Other Ambulatory Visit: Payer: Self-pay | Admitting: Family Medicine

## 2020-07-27 DIAGNOSIS — F4321 Adjustment disorder with depressed mood: Secondary | ICD-10-CM

## 2020-07-27 NOTE — Telephone Encounter (Signed)
Refill request for: Alprazolam 0.5 mg LR 06/23/20, #60, 0 rf LOV  05/07/20 FOV 08/05/20  Please review and advise.  Thanks. Dm/cma

## 2020-08-05 ENCOUNTER — Ambulatory Visit (INDEPENDENT_AMBULATORY_CARE_PROVIDER_SITE_OTHER): Payer: Medicare Other | Admitting: Family Medicine

## 2020-08-05 ENCOUNTER — Encounter: Payer: Self-pay | Admitting: Family Medicine

## 2020-08-05 ENCOUNTER — Other Ambulatory Visit: Payer: Self-pay

## 2020-08-05 VITALS — BP 140/82 | HR 88 | Temp 97.7°F | Ht 76.0 in | Wt 224.2 lb

## 2020-08-05 DIAGNOSIS — J439 Emphysema, unspecified: Secondary | ICD-10-CM | POA: Diagnosis not present

## 2020-08-05 DIAGNOSIS — I10 Essential (primary) hypertension: Secondary | ICD-10-CM

## 2020-08-05 DIAGNOSIS — M47816 Spondylosis without myelopathy or radiculopathy, lumbar region: Secondary | ICD-10-CM

## 2020-08-05 DIAGNOSIS — E782 Mixed hyperlipidemia: Secondary | ICD-10-CM

## 2020-08-05 DIAGNOSIS — I739 Peripheral vascular disease, unspecified: Secondary | ICD-10-CM

## 2020-08-05 DIAGNOSIS — F321 Major depressive disorder, single episode, moderate: Secondary | ICD-10-CM | POA: Diagnosis not present

## 2020-08-05 DIAGNOSIS — N401 Enlarged prostate with lower urinary tract symptoms: Secondary | ICD-10-CM | POA: Diagnosis not present

## 2020-08-05 DIAGNOSIS — R3911 Hesitancy of micturition: Secondary | ICD-10-CM

## 2020-08-05 DIAGNOSIS — Z86010 Personal history of colonic polyps: Secondary | ICD-10-CM

## 2020-08-05 DIAGNOSIS — I2510 Atherosclerotic heart disease of native coronary artery without angina pectoris: Secondary | ICD-10-CM | POA: Insufficient documentation

## 2020-08-05 MED ORDER — TAMSULOSIN HCL 0.4 MG PO CAPS
0.8000 mg | ORAL_CAPSULE | Freq: Every day | ORAL | 3 refills | Status: DC
Start: 2020-08-05 — End: 2021-01-09

## 2020-08-05 MED ORDER — AMLODIPINE BESYLATE 10 MG PO TABS
1.0000 | ORAL_TABLET | Freq: Every day | ORAL | 3 refills | Status: DC
Start: 2020-08-05 — End: 2021-06-02

## 2020-08-05 MED ORDER — PAROXETINE HCL 20 MG PO TABS: 20.0000 mg | ORAL_TABLET | Freq: Every day | ORAL | 3 refills | Status: DC

## 2020-08-05 MED ORDER — HYDROCHLOROTHIAZIDE 25 MG PO TABS
1.0000 | ORAL_TABLET | Freq: Every day | ORAL | 3 refills | Status: DC
Start: 2020-08-05 — End: 2021-06-02

## 2020-08-05 MED ORDER — TRAMADOL HCL 50 MG PO TABS: 50.0000 mg | ORAL_TABLET | Freq: Three times a day (TID) | ORAL | 0 refills | Status: DC | PRN

## 2020-08-05 MED ORDER — PRAVASTATIN SODIUM 80 MG PO TABS: 80.0000 mg | ORAL_TABLET | Freq: Every day | ORAL | 3 refills | Status: DC

## 2020-08-05 NOTE — Progress Notes (Signed)
[facility] [address] [place] [address] Dept: [phone] Dept Fax: [phone]  Transfer of Care Office Visit  Subjective:    Patient ID: [name], male    DOB: May 15, 1955, 65 y.o..   MRN: [id number]  Chief Complaint  Patient presents with  . Follow-up    3 month f/u HTN. Average BP 130/90 at home.  C/o still having RT leg pain and wants to know if he can get an RX for Tramadol.       History of Present Illness:  Patient is in today to establish care. [name] is originally from [place]. His wife died in 101 from breast cancer. He has two children, both of whom live close by. [name] retired on April 1 and is happy to be at this point. He does not use alcohol or drugs. He does admit to smoking 1 ppd of cigarettes. He finds that his grief over the past 2 years has prevented him from making any headway on stopping smoking.  [name] has a history of a prior AAA and is s/p grafting for this. He also has had a right femoral-popliteal artery bypass performed. He notes tha the suffered some muscle loss priro to the surgery and has chronic pain in the right leg. He finds that he cannot walk more than 50 yards withotu stopping to rest. He wonders about getting a scooter to assist with his mobility.  [name] has a history of emphysema. He expressed the thought that his breathing troubles might be more related to his kidney issue. He feels this may be the case because his lungs are usually clear when people listen to them.  [name] has a history of hypertension, managed with amlodipine, benzaepril, and HCTZ. He also has hyperlipidemia, managed with pravastatin.  [name] has issues with both grief regarding his wife's death and with anxiety. He take alprazolam, 1 tablet daily and a 1/2 tablet as needed. He finds this helpful for the anxiety. However, he has not noted any benefit to the Paxil 10 mg daily.  [name]  has a history of low back secondary to arthritis. He has used tramadol for this in the past. He notes he takes this 3-4 times a month. The medication has been effective for when he has flares of pain.  [name] has a history of BPH and a mildly elevated PSA. He notes that he still has to stand and strain for several minutes when urinating, despite the Flomax.  Past Medical History: Patient Active Problem List   Diagnosis Date Noted  . Arthritis 05/07/2020  . Dental infection   . Near syncope   . Hyponatremia 05/06/2019  . Pulmonary emphysema ([place]) 03/11/2019  . Grieving 07/27/2018  . Anxiety disorder due to general medical condition 07/27/2018  . Elevated PSA, less than 10 ng/ml 01/17/2018  . Muscle cramps 01/17/2018  . Chronic kidney disease, stage 3b ([place]) 08/16/2017  . Mixed dyslipidemia 06/01/2017  . Tobacco use disorder, continuous 06/01/2017  . Benign prostatic hyperplasia with urinary hesitancy 09/22/2015  . Abdominal aortic aneurysm ([place]) 06/12/2013  . Peripheral vascular disease, unspecified ([place]) 04/26/2011  . 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  . 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 History of colon polyps 05/02/2007  . Essential hypertension 11/08/2006  . GERD 11/08/2006    Past Surgical History:  Procedure Laterality Date  . ABDOMINAL AORTIC ENDOVASCULAR STENT GRAFT N/A 07/17/2017   Procedure: ABDOMINAL AORTIC ENDOVASCULAR  STENT GRAFT REPAIR WITH ILIAC BRANCH DEVICE;  Surgeon: [name], MD;  Location: [facility] OR;  Service: Vascular;  Laterality: N/A;  . ANGIOPLASTY  09/20/10   Rt AK to tibioperoneal trunk BPG w/ vein patch angioplasty of tibioperoneal trunk   . CARPAL TUNNEL RELEASE Bilateral   . FOOT SURGERY     right x2  . MOHS SURGERY     lower lip  . PR VEIN BYPASS GRAFT,AORTO-FEM-POP    . TENDON REPAIR Right 2006   Forearm   Family History  Problem Relation Age of Onset  . Adrenal disorder Brother   . Hyperlipidemia Brother   . Hypertension  Brother   . Heart disease Brother        Heart Disease before age 66  . Heart attack Brother   . Heart disease Mother        Heart Disease before age 38  . Hyperlipidemia Mother   . Hypertension Mother   . Heart attack Mother   . Heart disease Father        Heart Disease before age 29  . Hyperlipidemia Father   . Heart attack Father   . Pulmonary embolism Sister    Outpatient Medications Prior to Visit  Medication Sig Dispense Refill  . acetaminophen (TYLENOL) 325 MG tablet Take 650 mg by mouth every 6 (six) hours as needed for moderate pain.    Marland Kitchen albuterol (VENTOLIN HFA) 108 (90 Base) MCG/ACT inhaler INHALE 1 PUFF INTO THE LUNGS EVERY 6 HOURS AS NEEDED FOR WHEEZING OR SHORTNESS OF BREATH. 6.7 each 1  . ALPRAZolam (XANAX) 0.5 MG tablet TAKE 1 TABLET BY MOUTH TWICE A DAY AS NEEDED FOR ANXIETY 60 tablet 0  . amLODipine (NORVASC) 10 MG tablet TAKE 1 TABLET BY MOUTH EVERY DAY 90 tablet 0  . ASPIRIN LOW DOSE 81 MG EC tablet TAKE 1 TABLET BY MOUTH EVERY DAY 120 tablet 1  . benazepril (LOTENSIN) 40 MG tablet TAKE 1 TABLET BY MOUTH EVERY DAY 90 tablet 0  . magnesium 30 MG tablet Take 30 mg by mouth 2 (two) times daily.    . Multiple Vitamin (MULTIVITAMIN) tablet Take 1 tablet by mouth daily.    Marland Kitchen omeprazole (PRILOSEC) 20 MG capsule TAKE 1 CAPSULE BY MOUTH EVERY DAY 90 capsule 1  . pravastatin (PRAVACHOL) 40 MG tablet TAKE 2 TABLETS BY MOUTH EVERY DAY 180 tablet 1  . hydrochlorothiazide (HYDRODIURIL) 25 MG tablet TAKE 1 TABLET BY MOUTH EVERY DAY 90 tablet 0  . PARoxetine (PAXIL) 10 MG tablet TAKE 1 TABLET BY MOUTH EVERY DAY 90 tablet 1  . tamsulosin (FLOMAX) 0.4 MG CAPS capsule TAKE 1 CAPSULE BY MOUTH EVERY DAY 90 capsule 0   No facility-administered medications prior to visit.   Allergies  Allergen Reactions  . Cortisone   . Fentanyl     hypotension  . Sulfonamide Derivatives     REACTION: rash      Objective:   Today's Vitals   08/05/20 1501  BP: 140/82  Pulse: 88  Temp: 97.7  F (36.5 C)  TempSrc: Temporal  SpO2: 95%  Weight: 224 lb 3.2 oz (101.7 kg)  Height: 6\' 4"  (1.93 m)   Body mass index is 27.29 kg/m.   General: Well developed, well nourished. No acute distress. Psych: Alert and oriented x3. Normal mood and affect.  Health Maintenance Due  Topic Date Due  . Hepatitis C Screening  Never done  . COLONOSCOPY (Pts 45-18yrs Insurance coverage will need to be confirmed)  Never done  . COVID-19 Vaccine (4 - Booster for [place] series) 03/30/2020  . PNA vac Low Risk Adult (1 of 2 - PCV13) Never done     Assessment & Plan:   1. Essential hypertension Mr. Kinnick blood pressure remains mildly high at this point. If not improving at our next visit, we will consider if we need to modify his medications. I have strongly advised him of the need to stop smoking.   - hydrochlorothiazide (HYDRODIURIL) 25 MG tablet; Take 1 tablet (25 mg total) by mouth daily.  Dispense: 90 tablet; Refill: 3 - amLODipine (NORVASC) 10 MG tablet; Take 1 tablet (10 mg total) by mouth daily.  Dispense: 90 tablet; Refill: 3  2. Depression, major, single episode, moderate [facility]) Mr. Christoffersen' grief has been prolonged to the point that this classifies as abnormal grief/depression. He has been on a low dose of Paxil without benefit. I will increase this to 20 mg daily and plan to reassess him in 3 months.  - PARoxetine (PAXIL) 20 MG tablet; Take 1 tablet (20 mg total) by mouth daily.  Dispense: 90 tablet; Refill: 3  3. Benign prostatic hyperplasia with urinary hesitancy Mr. Parekh is not finding benefit on the 0.4 mg dose of Flomax. I will have him increase to 0.8 mg daily and reassess at his next visit.  - tamsulosin (FLOMAX) 0.4 MG CAPS capsule; Take 2 capsules (0.8 mg total) by mouth daily.  Dispense: 180 capsule; Refill: 3  4. Pulmonary emphysema, unspecified emphysema type ([place]) I discussed with Mr. Patchell about the pathophysiology of emphysema. I do belief his mild tachypnea is due to this.  He very much needs to stop smoking. If we can get his depression in check, I will attempt further interventions for smoking. He also may benefit from further therapy for his COPD.  5. Arthritis of lumbar spine I feel it is reasonable to provide him a small dose of tramadol for occasional flares of low back pain. He is on concurrent BZDs, but we will keep the quantity restricted to reduce risk from the combination.  - traMADol (ULTRAM) 50 MG tablet; Take 1 tablet (50 mg total) by mouth every 8 (eight) hours as needed for up to 5 days.  Dispense: 12 tablet; Refill: 0  6. History of colon polyps Mr. Wigfall has a history of colon polyps. He is unwilling at this point to have another colonoscopy, but is open to me asking again in the future.  7. Mixed dyslipidemia ON pravastatin. We will recheck his fasting lipids at his next visit in 3 months.  8. Peripheral vascular disease, unspecified ([place]) Manage pain associated with prior leg muscle damage as noted above. I asked patient to obtain paperwork from scooter provider for me to complete.  - traMADol (ULTRAM) 50 MG tablet; Take 1 tablet (50 mg total) by mouth every 8 (eight) hours as needed for up to 5 days.  Dispense: 12 tablet; Refill: 0   Haydee Salter, MD

## 2020-09-24 ENCOUNTER — Other Ambulatory Visit: Payer: Self-pay | Admitting: Family Medicine

## 2020-09-24 DIAGNOSIS — J439 Emphysema, unspecified: Secondary | ICD-10-CM

## 2020-10-23 ENCOUNTER — Other Ambulatory Visit: Payer: Self-pay | Admitting: Family Medicine

## 2020-10-23 DIAGNOSIS — N183 Chronic kidney disease, stage 3 unspecified: Secondary | ICD-10-CM

## 2020-10-23 DIAGNOSIS — I10 Essential (primary) hypertension: Secondary | ICD-10-CM

## 2020-10-30 ENCOUNTER — Other Ambulatory Visit: Payer: Self-pay | Admitting: Family Medicine

## 2020-10-30 DIAGNOSIS — F4321 Adjustment disorder with depressed mood: Secondary | ICD-10-CM

## 2020-10-30 DIAGNOSIS — F064 Anxiety disorder due to known physiological condition: Secondary | ICD-10-CM

## 2020-11-01 ENCOUNTER — Other Ambulatory Visit: Payer: Self-pay | Admitting: Family Medicine

## 2020-11-01 DIAGNOSIS — M47816 Spondylosis without myelopathy or radiculopathy, lumbar region: Secondary | ICD-10-CM

## 2020-11-01 DIAGNOSIS — I739 Peripheral vascular disease, unspecified: Secondary | ICD-10-CM

## 2020-12-04 ENCOUNTER — Other Ambulatory Visit: Payer: Self-pay | Admitting: Family Medicine

## 2020-12-04 DIAGNOSIS — K219 Gastro-esophageal reflux disease without esophagitis: Secondary | ICD-10-CM

## 2020-12-18 ENCOUNTER — Encounter: Payer: Self-pay | Admitting: Family Medicine

## 2020-12-26 ENCOUNTER — Other Ambulatory Visit: Payer: Self-pay | Admitting: Family Medicine

## 2020-12-26 DIAGNOSIS — F4321 Adjustment disorder with depressed mood: Secondary | ICD-10-CM

## 2020-12-28 NOTE — Telephone Encounter (Signed)
Patient is 2 months past due for follow-up Needs appointment.

## 2020-12-28 NOTE — Telephone Encounter (Signed)
Refill request for:  Alprazolam 0.5 mg LR 07/27/20, #60, 0 rf LOV 08/05/20 FOV none scheduled.     Please  review and advise.  Thanks. Dm/cma

## 2021-01-05 ENCOUNTER — Other Ambulatory Visit: Payer: Self-pay

## 2021-01-05 ENCOUNTER — Ambulatory Visit (INDEPENDENT_AMBULATORY_CARE_PROVIDER_SITE_OTHER): Payer: Medicare Other | Admitting: Family Medicine

## 2021-01-05 ENCOUNTER — Encounter: Payer: Self-pay | Admitting: Family Medicine

## 2021-01-05 VITALS — BP 132/78 | HR 94 | Temp 97.5°F | Ht 76.0 in | Wt 223.8 lb

## 2021-01-05 DIAGNOSIS — I2510 Atherosclerotic heart disease of native coronary artery without angina pectoris: Secondary | ICD-10-CM

## 2021-01-05 DIAGNOSIS — J439 Emphysema, unspecified: Secondary | ICD-10-CM

## 2021-01-05 DIAGNOSIS — I10 Essential (primary) hypertension: Secondary | ICD-10-CM | POA: Diagnosis not present

## 2021-01-05 DIAGNOSIS — N401 Enlarged prostate with lower urinary tract symptoms: Secondary | ICD-10-CM

## 2021-01-05 DIAGNOSIS — M47816 Spondylosis without myelopathy or radiculopathy, lumbar region: Secondary | ICD-10-CM

## 2021-01-05 DIAGNOSIS — E782 Mixed hyperlipidemia: Secondary | ICD-10-CM

## 2021-01-05 DIAGNOSIS — Z95828 Presence of other vascular implants and grafts: Secondary | ICD-10-CM | POA: Insufficient documentation

## 2021-01-05 DIAGNOSIS — F064 Anxiety disorder due to known physiological condition: Secondary | ICD-10-CM

## 2021-01-05 DIAGNOSIS — R3911 Hesitancy of micturition: Secondary | ICD-10-CM

## 2021-01-05 DIAGNOSIS — F17209 Nicotine dependence, unspecified, with unspecified nicotine-induced disorders: Secondary | ICD-10-CM

## 2021-01-05 MED ORDER — DUTASTERIDE 0.5 MG PO CAPS: 0.5000 mg | ORAL_CAPSULE | Freq: Every day | ORAL | 3 refills | Status: DC

## 2021-01-05 MED ORDER — ALBUTEROL SULFATE HFA 108 (90 BASE) MCG/ACT IN AERS: 2.0000 | INHALATION_SPRAY | Freq: Four times a day (QID) | RESPIRATORY_TRACT | 3 refills | Status: DC | PRN

## 2021-01-05 MED ORDER — TRAMADOL HCL 50 MG PO TABS: ORAL_TABLET | ORAL | 0 refills | Status: DC

## 2021-01-05 MED ORDER — ALPRAZOLAM 0.5 MG PO TABS: 0.5000 mg | ORAL_TABLET | Freq: Two times a day (BID) | ORAL | 2 refills | Status: DC | PRN

## 2021-01-05 NOTE — Progress Notes (Signed)
[facility] [address] [place] [address] Dept: [phone] Dept Fax: [phone]  Chronic Care Office Visit  Subjective:    Patient ID: [name], male    DOB: [birth year], 65 y.o..   MRN: [id number]  Chief Complaint  Patient presents with   Follow-up    F/u meds and needs renewal on handicap placard.  Declines flu shot.     History of Present Illness:  Patient is in today for reassessment of chronic medical issues.  Mr. Moquin has a history of a prior AAA and is s/p grafting for this. He also has had a right femoral-popliteal artery bypass performed. He had some muscle loss prior to the surgery and has chronic pain in the right leg. He finds that he cannot walk more than 50 yards without stopping to rest. Hew recently purchased a scooter, which is allowing him to go on outings with his family more.   Mr. Spratlin has a history of emphysema. He is managed on albuterol. He finds the white inhaler doesn't work as well as the blue inhaler (differing brands). He continues to smoke cigarettes and is not interested in quitting at present.   Mr. Fordham has a history of hypertension, managed with amlodipine, benzaepril, and HCTZ. He also has hyperlipidemia, managed with pravastatin.   Mr. Garofano has issues with both grief regarding his wife's death and with anxiety. He takes alprazolam, 1 tablet daily and a 1/2 tablet as needed. He finds this helpful for the anxiety. He continues to take Paxil, which was increased to 20 mg at his last visit in May.   Mr. Gaskill has a history of low back secondary to arthritis. He takes an occasional tramadol for this. he medication has been effective for when he has flares of pain.   Mr. Pepitone has a history of BPH and a mildly elevated PSA. We increased his Flomax at his last visit to 0.8 mg daily, but he still has to stand and strain for several minutes when urinating.  Past Medical  History: Patient Active Problem List   Diagnosis Date Noted   History of endovascular stent graft for abdominal aortic aneurysm (AAA) 01/05/2021   Atherosclerotic cardiovascular disease 08/05/2020   Arthritis 05/07/2020   Dental infection    Near syncope    Hyponatremia 05/06/2019   Pulmonary emphysema ([place]) 03/11/2019   Depression, major, single episode, moderate ([place]) 07/27/2018   Anxiety disorder due to general medical condition 07/27/2018   Elevated PSA, less than 10 ng/ml 01/17/2018   Muscle cramps 01/17/2018   Chronic kidney disease, stage 3b ([place]) 08/16/2017   Mixed dyslipidemia 06/01/2017   Tobacco use disorder, continuous 06/01/2017   Benign prostatic hyperplasia with urinary hesitancy 09/22/2015   Peripheral vascular disease, unspecified ([place]) 04/26/2011   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   Cancer of the lip, oral cavity, 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   History of colon polyps 05/02/2007   Essential hypertension 11/08/2006   GERD 11/08/2006   Past Surgical History:  Procedure Laterality Date   ABDOMINAL AORTIC ENDOVASCULAR STENT GRAFT N/A 07/17/2017   Procedure: ABDOMINAL AORTIC ENDOVASCULAR STENT GRAFT REPAIR WITH ILIAC BRANCH DEVICE;  Surgeon: [name], MD;  Location: [facility] OR;  Service: Vascular;  Laterality: N/A;   ANGIOPLASTY  09/20/10   Rt AK to tibioperoneal trunk BPG w/ vein patch angioplasty of tibioperoneal trunk    CARPAL TUNNEL RELEASE Bilateral    FOOT SURGERY     right x2  MOHS SURGERY     lower lip   PR VEIN BYPASS GRAFT,AORTO-FEM-POP     TENDON REPAIR Right 2006   Forearm   Family History  Problem Relation Age of Onset   Adrenal disorder Brother    Hyperlipidemia Brother    Hypertension Brother    Heart disease Brother        Heart Disease before age 45   Heart attack Brother    Heart disease Mother        Heart Disease before age 3   Hyperlipidemia Mother    Hypertension Mother    Heart attack Mother    Heart disease Father         Heart Disease before age 38   Hyperlipidemia Father    Heart attack Father    Pulmonary embolism Sister    Outpatient Medications Prior to Visit  Medication Sig Dispense Refill   acetaminophen (TYLENOL) 325 MG tablet Take 650 mg by mouth every 6 (six) hours as needed for moderate pain.     amLODipine (NORVASC) 10 MG tablet Take 1 tablet (10 mg total) by mouth daily. 90 tablet 3   ASPIRIN LOW DOSE 81 MG EC tablet TAKE 1 TABLET BY MOUTH EVERY DAY 120 tablet 1   benazepril (LOTENSIN) 40 MG tablet TAKE 1 TABLET BY MOUTH EVERY DAY 90 tablet 3   hydrochlorothiazide (HYDRODIURIL) 25 MG tablet Take 1 tablet (25 mg total) by mouth daily. 90 tablet 3   magnesium 30 MG tablet Take 30 mg by mouth 2 (two) times daily.     Multiple Vitamin (MULTIVITAMIN) tablet Take 1 tablet by mouth daily.     omeprazole (PRILOSEC) 20 MG capsule TAKE 1 CAPSULE BY MOUTH EVERY DAY 90 capsule 3   PARoxetine (PAXIL) 20 MG tablet Take 1 tablet (20 mg total) by mouth daily. 90 tablet 3   pravastatin (PRAVACHOL) 80 MG tablet Take 1 tablet (80 mg total) by mouth daily. 90 tablet 3   tamsulosin (FLOMAX) 0.4 MG CAPS capsule Take 2 capsules (0.8 mg total) by mouth daily. 180 capsule 3   albuterol (VENTOLIN HFA) 108 (90 Base) MCG/ACT inhaler INHALE 1 PUFF INTO THE LUNGS EVERY 6 HOURS AS NEEDED FOR WHEEZING OR SHORTNESS OF BREATH. 6.7 each 3   ALPRAZolam (XANAX) 0.5 MG tablet TAKE 1 TABLET BY MOUTH TWICE A DAY AS NEEDED FOR ANXIETY 60 tablet 0   traMADol (ULTRAM) 50 MG tablet TAKE 1 TABLET(50 MG) BY MOUTH EVERY 8 HOURS FOR UP TO 5 DAYS AS NEEDED 12 tablet 0   No facility-administered medications prior to visit.   Allergies  Allergen Reactions   Cortisone    Fentanyl     hypotension   Sulfonamide Derivatives     REACTION: rash    Objective:   Today's Vitals   01/05/21 1427  BP: 132/78  Pulse: 94  Temp: (!) 97.5 F (36.4 C)  TempSrc: Temporal  SpO2: 96%  Weight: 223 lb 12.8 oz (101.5 kg)  Height: 6\' 4"  (1.93 m)    Body mass index is 27.24 kg/m.   General: Well developed, well nourished. No acute distress. Psych: Alert and oriented. Normal mood and affect.  Health Maintenance Due  Topic Date Due   Pneumonia Vaccine 16+ Years old (1 - PCV) Never done   Hepatitis C Screening  Never done   Zoster Vaccines- Shingrix (1 of 2) Never done   COLONOSCOPY (Pts 45-62yrs Insurance coverage will need to be confirmed)  Never done   COVID-19 Vaccine (  4 - Booster for [employer] series) 02/23/2020   INFLUENZA VACCINE  10/12/2020     Assessment & Plan:   1. Essential hypertension Blood pressure is adequately controlled. Continue amlodipine, benazepril, and HCTZ.  2. Atherosclerotic cardiovascular disease Continue aspirin.  3. Pulmonary emphysema, unspecified emphysema type ([place]) I will renew his albuterol.  - albuterol (VENTOLIN HFA) 108 (90 Base) MCG/ACT inhaler; Inhale 2 puffs into the lungs every 6 (six) hours as needed for wheezing or shortness of breath.  Dispense: 6.7 each; Refill: 3  4. Arthritis of lumbar spine We willc otninue to provide a small amount of tramadol (#12) for 3 months.  - traMADol (ULTRAM) 50 MG tablet; TAKE 1 TABLET(50 MG) BY MOUTH EVERY 8 HOURS AS NEEDED  Dispense: 12 tablet; Refill: 0  5. Mixed dyslipidemia Last lipids at goal. We will be due to reassess his lipids at his next visit.  6. Tobacco use disorder, continuous I recommend Mr. Domangue try and stop smoking. This would benefit his emphysema and his ASCVD/PVD. He is aware of help, should he reach a point where he can try and quit.  7. Benign prostatic hyperplasia with urinary hesitancy I will try adding a 5 alpha-reductase inhibitor. He is due for a repeat PSA at his next visit.  - dutasteride (AVODART) 0.5 MG capsule; Take 1 capsule (0.5 mg total) by mouth daily.  Dispense: 90 capsule; Refill: 3  8. Anxiety disorder due to general medical condition Continue Paxil and PRN Xanax.  - ALPRAZolam (XANAX) 0.5 MG tablet;  Take 1 tablet (0.5 mg total) by mouth 2 (two) times daily as needed for anxiety.  Dispense: 60 tablet; Refill: 2  Haydee Salter, MD

## 2021-01-09 ENCOUNTER — Other Ambulatory Visit: Payer: Self-pay | Admitting: Family Medicine

## 2021-01-09 DIAGNOSIS — I10 Essential (primary) hypertension: Secondary | ICD-10-CM

## 2021-01-09 DIAGNOSIS — N401 Enlarged prostate with lower urinary tract symptoms: Secondary | ICD-10-CM

## 2021-04-05 IMAGING — CT CT MAXILLOFACIAL W/ CM
3 series · 14 of 47 positions shown, 16 images · IV contrast (Omnipaque)
Comparison: Head CT 08/31/2016.

CLINICAL DATA: 63-year-old male with suspected dental infection,
left upper front tooth.

EXAM:
CT MAXILLOFACIAL WITH CONTRAST
TECHNIQUE: Multidetector CT imaging of the maxillofacial structures was
performed with intravenous contrast. Multiplanar CT image
reconstructions were also generated.
CONTRAST:  75mL OMNIPAQUE IOHEXOL 300 MG/ML  SOLN

[Series 2: max soft · axial · 0.37mm/px · z∈[-212,-40]mm · 8 of 100 slices shown, 10 images]
[im 7/100  brain]
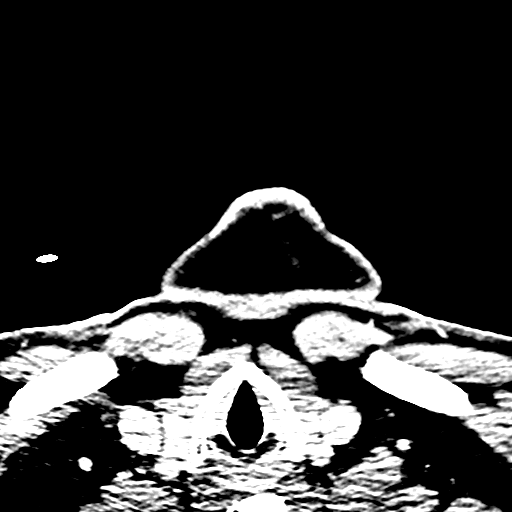
[im 7/100  bone]
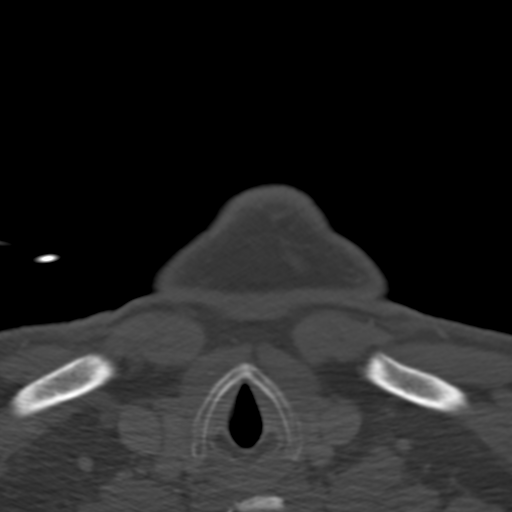
[im 21/100  bone]
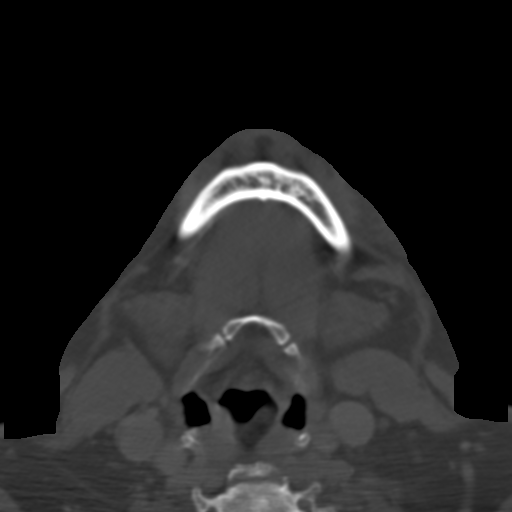
[im 31/100  bone]
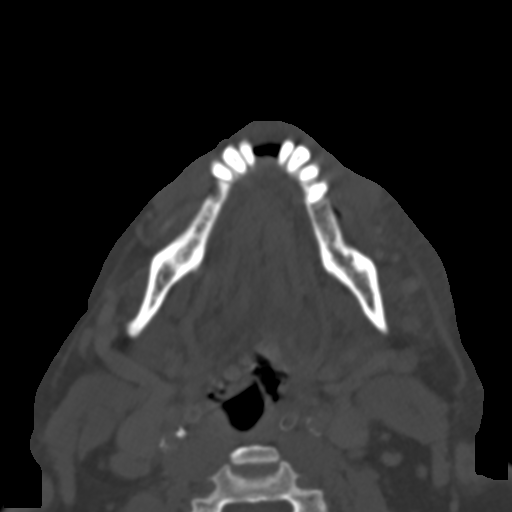
[im 45/100  bone]
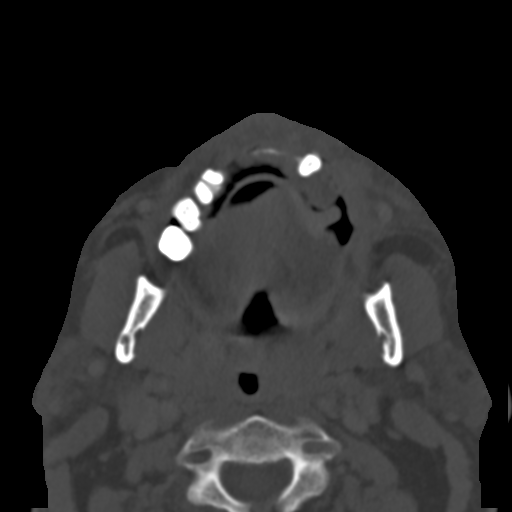
[im 55/100  brain]
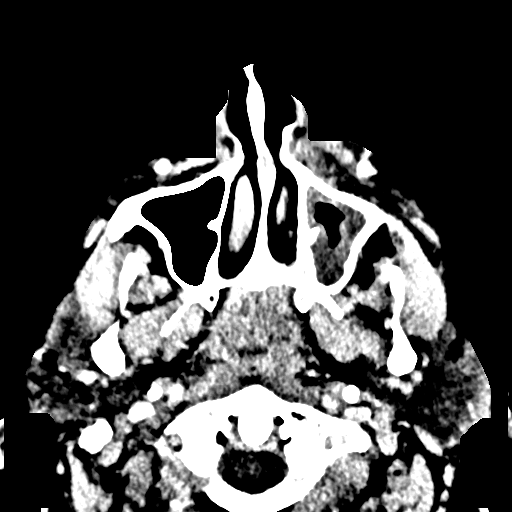
[im 55/100  bone]
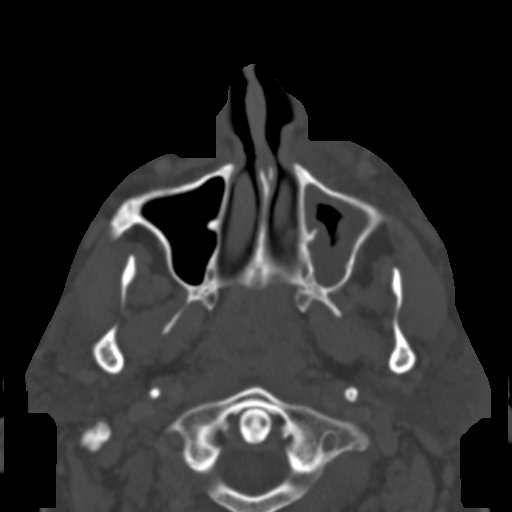
[im 69/100  bone]
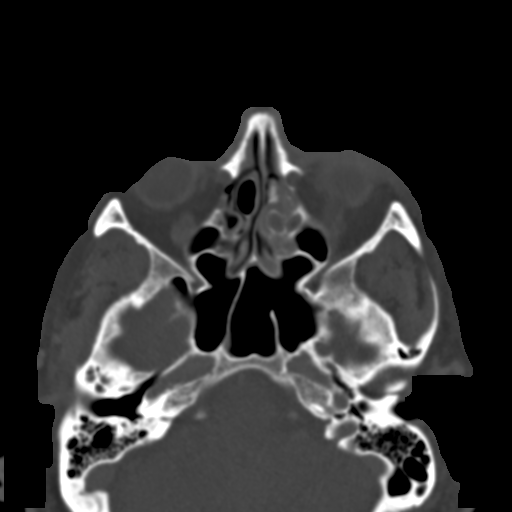
[im 79/100  bone]
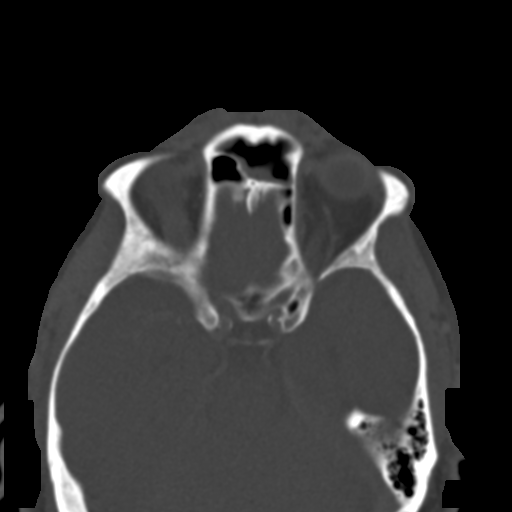
[im 93/100  bone]
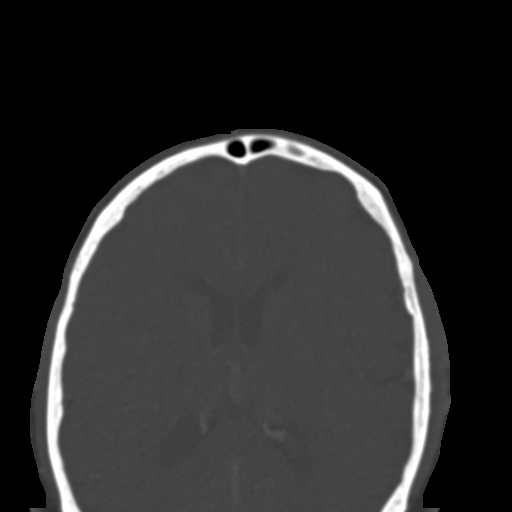

[Series 6: coronal soft · coronal · 0.40mm/px · 3 of 90 slices shown]
[im 30/90  bone]
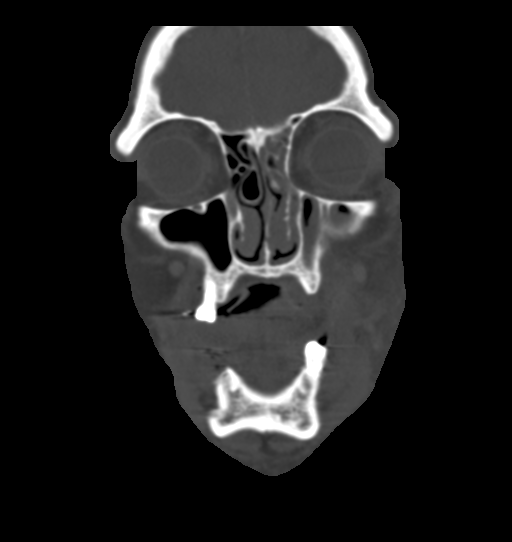
[im 40/90  bone]
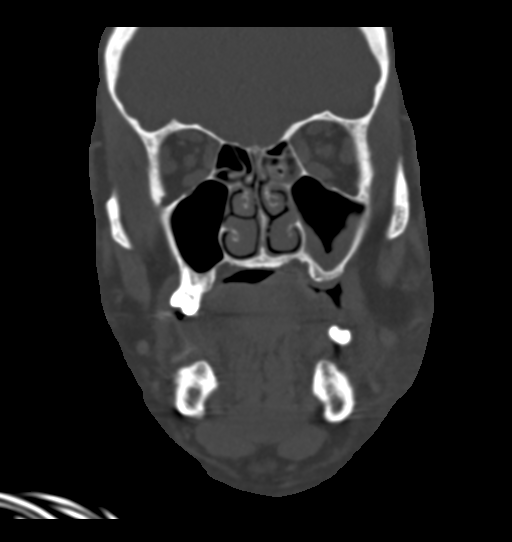
[im 50/90  bone]
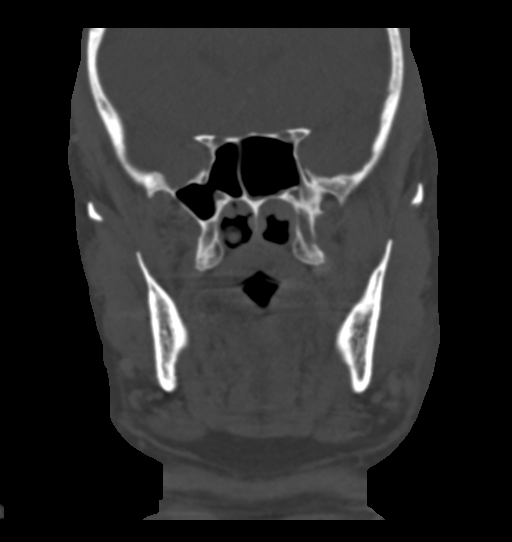

[Series 8: sagittal soft · sagittal · 0.37mm/px · 3 of 97 slices shown]
[im 33/97  bone]
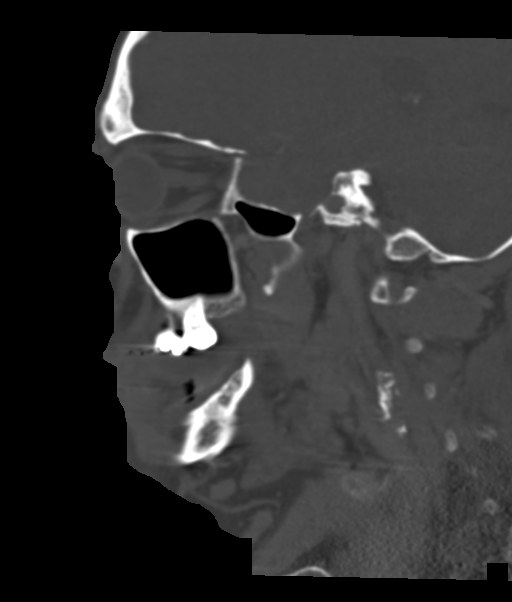
[im 49/97  bone]
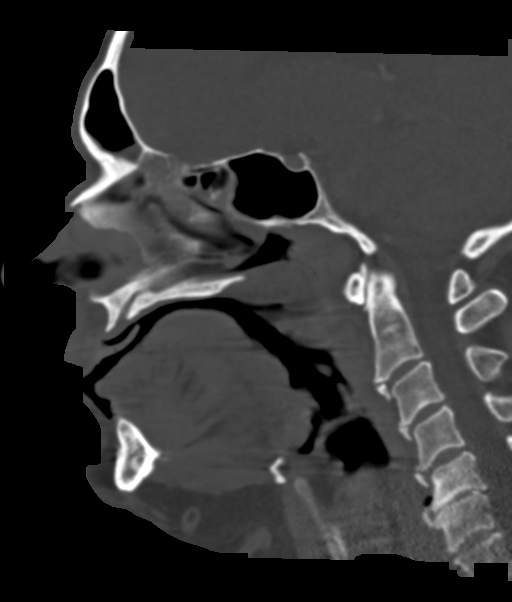
[im 65/97  bone]
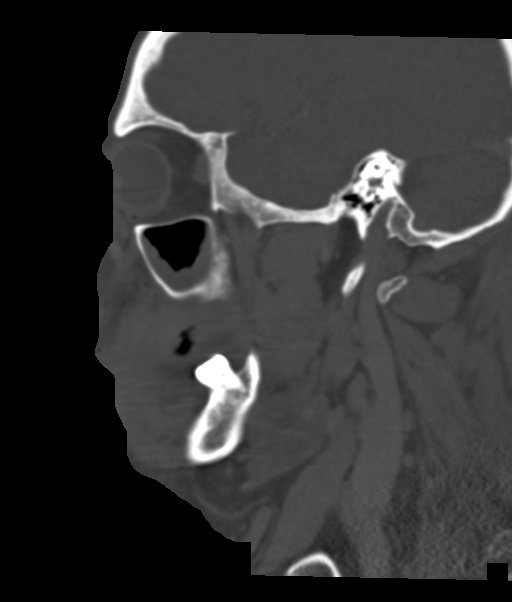

[14 of 47 positions shown; findings below may reference images not displayed]

FINDINGS: Osseous: Intact mandible. Multiple areas of absent dentition
bilaterally. A left maxillary residual canine or anterior bicuspid
is noted with mild periapical lucency (series 3, image 47 and series
7, image 28).

On the right side there is more severe periapical lucency at the
anterior right maxillary molar.

Otherwise the maxilla appears intact. Intact zygoma, nasal bones,
central skull base. Visible cervical vertebrae also appear intact
with degenerative changes.

Orbits: Orbital walls intact. Orbits soft tissues appear symmetric
and within normal limits.

Sinuses: Up to moderate left side paranasal sinus mucosal thickening
at the frontoethmoidal recess, ethmoids and left maxillary sinus. No
sinus fluid levels. Contralateral right paranasal sinuses are
largely clear. Tympanic cavities and mastoids are clear.

Soft tissues: Asymmetric left face soft tissue swelling and
stranding including the left buccal space and lateral to the left
mandible (series 2, image 76). There does appear to be subperiosteal
fluid along the left maxilla alveolar process as seen on series 2,
image 54 and series 6, image 34, although this is somewhat posterior
to the anterior left maxillary tooth. No soft tissue gas. No other
soft tissue fluid is evident.

The left sublingual and submandibular spaces are spared. There is
mild thickening of the left platysma. The left masticator and
parotid spaces also appear spared.

Negative visible larynx, pharynx, parapharyngeal and retropharyngeal
spaces. No cervical lymphadenopathy. Grossly patent major vascular
structures in the neck and at the skull base with moderate to
advanced proximal right ICA atherosclerosis. The left ICA siphon is
also heavily calcified.

Limited intracranial: Negative.
IMPRESSION: 1. Left facial cellulitis, possibly odontogenic with mild periapical
lucency at the residual anterior left maxillary tooth.
2. There appears to be edema or fluid along the maxilla alveolar
process posterior to the tooth, thus gingival or subperiosteal
abscess is difficult to exclude.
But no other fluid collection or complicating features.
3. There is mild to moderate left side paranasal sinus inflammation
although not obviously related to #1.
4. Proximal right ICA atherosclerosis which may be hemodynamically
significant.

## 2021-04-20 ENCOUNTER — Other Ambulatory Visit: Payer: Self-pay | Admitting: Family Medicine

## 2021-04-20 DIAGNOSIS — J439 Emphysema, unspecified: Secondary | ICD-10-CM

## 2021-05-30 ENCOUNTER — Encounter: Payer: Self-pay | Admitting: Family Medicine

## 2021-06-02 ENCOUNTER — Ambulatory Visit (INDEPENDENT_AMBULATORY_CARE_PROVIDER_SITE_OTHER): Payer: Medicare Other | Admitting: Family Medicine

## 2021-06-02 ENCOUNTER — Other Ambulatory Visit: Payer: Self-pay

## 2021-06-02 ENCOUNTER — Ambulatory Visit (INDEPENDENT_AMBULATORY_CARE_PROVIDER_SITE_OTHER): Payer: Medicare Other

## 2021-06-02 VITALS — BP 126/72 | HR 94 | Temp 97.4°F | Ht 76.0 in | Wt 230.4 lb

## 2021-06-02 DIAGNOSIS — I10 Essential (primary) hypertension: Secondary | ICD-10-CM

## 2021-06-02 DIAGNOSIS — M25512 Pain in left shoulder: Secondary | ICD-10-CM

## 2021-06-02 DIAGNOSIS — G250 Essential tremor: Secondary | ICD-10-CM

## 2021-06-02 DIAGNOSIS — N183 Chronic kidney disease, stage 3 unspecified: Secondary | ICD-10-CM

## 2021-06-02 DIAGNOSIS — F064 Anxiety disorder due to known physiological condition: Secondary | ICD-10-CM

## 2021-06-02 DIAGNOSIS — M19012 Primary osteoarthritis, left shoulder: Secondary | ICD-10-CM | POA: Diagnosis not present

## 2021-06-02 DIAGNOSIS — R3911 Hesitancy of micturition: Secondary | ICD-10-CM

## 2021-06-02 DIAGNOSIS — F321 Major depressive disorder, single episode, moderate: Secondary | ICD-10-CM

## 2021-06-02 DIAGNOSIS — E782 Mixed hyperlipidemia: Secondary | ICD-10-CM

## 2021-06-02 DIAGNOSIS — M47816 Spondylosis without myelopathy or radiculopathy, lumbar region: Secondary | ICD-10-CM

## 2021-06-02 DIAGNOSIS — N401 Enlarged prostate with lower urinary tract symptoms: Secondary | ICD-10-CM

## 2021-06-02 MED ORDER — DUTASTERIDE 0.5 MG PO CAPS: 0.5000 mg | ORAL_CAPSULE | Freq: Every day | ORAL | 3 refills | Status: DC

## 2021-06-02 MED ORDER — AMLODIPINE BESYLATE 10 MG PO TABS: 10.0000 mg | ORAL_TABLET | Freq: Every day | ORAL | 3 refills | Status: DC

## 2021-06-02 MED ORDER — NAPROXEN 500 MG PO TABS: 500.0000 mg | ORAL_TABLET | Freq: Two times a day (BID) | ORAL | 0 refills | Status: DC

## 2021-06-02 MED ORDER — TRAMADOL HCL 50 MG PO TABS: ORAL_TABLET | ORAL | 0 refills | Status: DC

## 2021-06-02 MED ORDER — TAMSULOSIN HCL 0.4 MG PO CAPS: 0.4000 mg | ORAL_CAPSULE | Freq: Every day | ORAL | 3 refills | Status: DC

## 2021-06-02 MED ORDER — ALPRAZOLAM 0.5 MG PO TABS: 0.5000 mg | ORAL_TABLET | Freq: Two times a day (BID) | ORAL | 2 refills | Status: DC | PRN

## 2021-06-02 MED ORDER — PRAVASTATIN SODIUM 80 MG PO TABS: 80.0000 mg | ORAL_TABLET | Freq: Every day | ORAL | 3 refills | Status: DC

## 2021-06-02 MED ORDER — BENAZEPRIL HCL 40 MG PO TABS: 40.0000 mg | ORAL_TABLET | Freq: Every day | ORAL | 3 refills | Status: DC

## 2021-06-02 MED ORDER — PAROXETINE HCL 20 MG PO TABS: 20.0000 mg | ORAL_TABLET | Freq: Every day | ORAL | 3 refills | Status: DC

## 2021-06-02 MED ORDER — HYDROCHLOROTHIAZIDE 25 MG PO TABS: 25.0000 mg | ORAL_TABLET | Freq: Every day | ORAL | 3 refills | Status: DC

## 2021-06-02 NOTE — Progress Notes (Signed)
[facility] [address] [place] [address] Dept: [phone] Dept Fax: [phone]  Office Visit  Subjective:    Patient ID: [name], male    DOB: 1955-08-10, 66 y.o..   MRN: [id number]  Chief Complaint  Patient presents with   Acute Visit    C/o having shoulder pain x 1 week after falling in floor after cough hard/got dizzy.   Cough using Albuterol.  Has taken Excedrin.     History of Present Illness:  Patient is in today for evaluation of left shoulder pain. He notes he fell at home a week ago. He was having a coughing episode at the time and became suddenly lightheaded. He fell to the floor. He has had some pain and limited range of motion in the shoulder since that time.  [name] notes that he has noted a bit of tremor over the past month. He cannot relate this necessarily to any specific thing. he does continue to smoke cigarettes and admits to drinking Pepsi[employer] regularly.  [name] has a history of arthritis of his lumbar spine. He has been receiving #12 tramadol every 3 months for management of his pain. He wonders about increasing this amount so that he could take this up to eight times a month.  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 07/27/2018   Elevated PSA, less than 10 ng/ml 01/17/2018   Muscle cramps 01/17/2018   Chronic kidney disease, stage 3b (HCC) 08/16/2017   Mixed dyslipidemia 06/01/2017   Tobacco use disorder, continuous 06/01/2017   Benign prostatic hyperplasia with urinary  hesitancy 09/22/2015   Peripheral vascular disease, unspecified (H[facility]) 04/26/2011   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   Cancer of the lip, oral cavity, 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   History of colon polyps 05/02/2007   Essential hypertension 11/08/2006   GERD 11/08/2006   Past Surgical History:  Procedure Laterality Date   ABDOMINAL AORTIC ENDOVASCULAR STENT GRAFT N/A 07/17/2017   Procedure: ABDOMINAL AORTIC ENDOVASCULAR STENT GRAFT REPAIR WITH ILIAC BRANCH DEVICE;  Surgeon: Larina Earthly, MD;  Location: [facility] OR;  Service: Vascular;  Laterality: N/A;   ANGIOPLASTY  09/20/10   Rt AK to tibioperoneal trunk BPG w/ vein patch angioplasty of tibioperoneal trunk    CARPAL TUNNEL RELEASE Bilateral    FOOT SURGERY     right x2   MOHS SURGERY     lower lip   PR VEIN BYPASS GRAFT,AORTO-FEM-POP     TENDON REPAIR Right 2006   Forearm   Family History  Problem Relation Age of Onset   Adrenal disorder Brother    Hyperlipidemia Brother    Hypertension Brother    Heart disease Brother        Heart Disease before age 41   Heart attack Brother    Heart disease Mother        Heart Disease before age 47   Hyperlipidemia Mother    Hypertension Mother    Heart attack Mother    Heart disease Father        Heart Disease before age 23  Hyperlipidemia Father    Heart attack Father    Pulmonary embolism Sister    Outpatient Medications Prior to Visit  Medication Sig Dispense Refill   acetaminophen (TYLENOL) 325 MG tablet Take 650 mg by mouth every 6 (six) hours as needed for moderate pain.     albuterol (VENTOLIN HFA) 108 (90 Base) MCG/ACT inhaler INHALE 2 PUFFS INTO THE LUNGS EVERY 6 HOURS AS NEEDED FOR WHEEZING OR SHORTNESS OF BREATH 6.7 g 1   ASPIRIN LOW DOSE 81 MG EC tablet TAKE 1 TABLET BY MOUTH EVERY DAY 120 tablet 1   magnesium 30 MG tablet Take 30 mg by mouth 2 (two) times daily.     Multiple Vitamin (MULTIVITAMIN) tablet Take 1 tablet by mouth daily.     omeprazole (PRILOSEC) 20 MG  capsule TAKE 1 CAPSULE BY MOUTH EVERY DAY 90 capsule 3   ALPRAZolam (XANAX) 0.5 MG tablet Take 1 tablet (0.5 mg total) by mouth 2 (two) times daily as needed for anxiety. 60 tablet 2   amLODipine (NORVASC) 10 MG tablet Take 1 tablet (10 mg total) by mouth daily. 90 tablet 3   benazepril (LOTENSIN) 40 MG tablet TAKE 1 TABLET BY MOUTH EVERY DAY 90 tablet 3   dutasteride (AVODART) 0.5 MG capsule Take 1 capsule (0.5 mg total) by mouth daily. 90 capsule 3   hydrochlorothiazide (HYDRODIURIL) 25 MG tablet Take 1 tablet (25 mg total) by mouth daily. 90 tablet 3   PARoxetine (PAXIL) 20 MG tablet Take 1 tablet (20 mg total) by mouth daily. 90 tablet 3   pravastatin (PRAVACHOL) 80 MG tablet Take 1 tablet (80 mg total) by mouth daily. 90 tablet 3   tamsulosin (FLOMAX) 0.4 MG CAPS capsule TAKE 1 CAPSULE BY MOUTH EVERY DAY 90 capsule 3   traMADol (ULTRAM) 50 MG tablet TAKE 1 TABLET(50 MG) BY MOUTH EVERY 8 HOURS AS NEEDED 12 tablet 0   No facility-administered medications prior to visit.   Allergies  Allergen Reactions   Cortisone    Fentanyl     hypotension   Sulfonamide Derivatives     REACTION: rash     Objective:   Today's Vitals   06/02/21 1608  BP: 126/72  Pulse: 94  Temp: (!) 97.4 F (36.3 C)  TempSrc: Temporal  SpO2: 95%  Weight: 230 lb 6.4 oz (104.5 kg)  Height: 6\' 4"  (1.93 m)   Body mass index is 28.05 kg/m.   General: Well developed, well nourished. No acute distress. Extremities: Left shoulder with limited ROM. He cannot lift the arm above shoulder level without   assistance from his other hand. He indicates pain over the anterior shoulder/upper arm. No subluxation   or dislocation noted. no pain over the [facility] joint and no swelling noted. No pain with resisted   abduction/internal rotation and good strength. Normal strength with forearm flexion and extension. Neuro: No resting tremor. Mild fine tremor noted when holding hands out. Psych: Alert and oriented. Normal mood and  affect.  Health Maintenance Due  Topic Date Due   Pneumonia Vaccine 71+ Years old (1 - PCV) Never done   Hepatitis C Screening  Never done   Zoster Vaccines- Shingrix (1 of 2) Never done   COLONOSCOPY (Pts 45-98yrs Insurance coverage will need to be confirmed)  Never done     Imaging: Left shoulder- No fracture or dislocation. AC joint normal.  Assessment & Plan:   1. Acute pain of left shoulder Suspect this is a shoulder sprain related to his fall.  We discussed using heat with ROM exercises. I will have him take an antiinflammatory for 2 weeks. If not improved/resolved, he should follow up with me.  - DG Shoulder Left - naproxen (NAPROSYN) 500 MG tablet; Take 1 tablet (500 mg total) by mouth 2 (two) times daily with a meal.  Dispense: 28 tablet; Refill: 0  2. Anxiety disorder due to general medical condition Stable with Xanax. This could potentially be helping to blunt his tremor.  - ALPRAZolam (XANAX) 0.5 MG tablet; Take 1 tablet (0.5 mg total) by mouth 2 (two) times daily as needed for anxiety.  Dispense: 60 tablet; Refill: 2  3. Essential hypertension Blood pressure is at goal.  - amLODipine (NORVASC) 10 MG tablet; Take 1 tablet (10 mg total) by mouth daily.  Dispense: 90 tablet; Refill: 3 - benazepril (LOTENSIN) 40 MG tablet; Take 1 tablet (40 mg total) by mouth daily.  Dispense: 90 tablet; Refill: 3 - hydrochlorothiazide (HYDRODIURIL) 25 MG tablet; Take 1 tablet (25 mg total) by mouth daily.  Dispense: 90 tablet; Refill: 3  4. CKD (chronic kidney disease) stage 3, GFR 30-59 ml/min (HCC) Stable.  - benazepril (LOTENSIN) 40 MG tablet; Take 1 tablet (40 mg total) by mouth daily.  Dispense: 90 tablet; Refill: 3  5. Benign prostatic hyperplasia with urinary hesitancy Stable.   - dutasteride (AVODART) 0.5 MG capsule; Take 1 capsule (0.5 mg total) by mouth daily.  Dispense: 90 capsule; Refill: 3 - tamsulosin (FLOMAX) 0.4 MG CAPS capsule; Take 1 capsule (0.4 mg total) by mouth  daily.  Dispense: 90 capsule; Refill: 3  6. Depression, major, single episode, moderate (HCC) Continue Paxil.  - PARoxetine (PAXIL) 20 MG tablet; Take 1 tablet (20 mg total) by mouth daily.  Dispense: 90 tablet; Refill: 3  7. Mixed dyslipidemia Ciontinue pravastatin.  - pravastatin (PRAVACHOL) 80 MG tablet; Take 1 tablet (80 mg total) by mouth daily.  Dispense: 90 tablet; Refill: 3  8. Arthritis of lumbar spine I am agreeable to up him to #24 tabs every 3 months. We will reassess how this is doing at his next visit.  - traMADol (ULTRAM) 50 MG tablet; TAKE 1 TABLET(50 MG) BY MOUTH EVERY 8 HOURS AS NEEDED  Dispense: 24 tablet; Refill: 0  9. Essential tremor Appears to be a benign essential tremor. I recommended he watch for any association with his smoking or caffeine use.  Return in about 2 weeks (around 06/16/2021), or if symptoms worsen or fail to improve.   Loyola Mast, MD

## 2021-06-03 ENCOUNTER — Telehealth: Payer: Self-pay

## 2021-06-03 NOTE — Telephone Encounter (Signed)
PA approved from 06/03/21 - 06/04/22.  Pharmacy notified [place] phone.  Dm/cma ? ?

## 2021-06-03 NOTE — Telephone Encounter (Signed)
PA for Tramadol 50 mg tablets submitted through cover my meds.  Awaiting response.  Dm/cma ? ?Key: [id number] ? ?

## 2021-06-03 NOTE — Telephone Encounter (Signed)
Val from [facility] called to confirm below authorization. Confirmed dates and Key code. ?

## 2021-06-04 ENCOUNTER — Other Ambulatory Visit: Payer: Self-pay | Admitting: Family Medicine

## 2021-06-04 DIAGNOSIS — J439 Emphysema, unspecified: Secondary | ICD-10-CM

## 2021-06-07 ENCOUNTER — Other Ambulatory Visit: Payer: Self-pay | Admitting: Family Medicine

## 2021-06-07 DIAGNOSIS — J439 Emphysema, unspecified: Secondary | ICD-10-CM

## 2021-06-14 ENCOUNTER — Telehealth: Payer: Self-pay | Admitting: Family Medicine

## 2021-06-14 NOTE — Telephone Encounter (Signed)
Left message for patient to call back and schedule Medicare Annual Wellness Visit (AWV). Please offer to do virtually or by telephone.  Left office number and my jabber #[phone]. ? ?Due for AWVI ? ?Please schedule at anytime with Nurse Health Advisor. ?  ?

## 2021-07-29 ENCOUNTER — Telehealth: Payer: Self-pay | Admitting: Family Medicine

## 2021-07-29 NOTE — Telephone Encounter (Signed)
Left message for patient to call back and schedule Medicare Annual Wellness Visit (AWV). Please offer to do virtually or by telephone.  Left office number and my jabber #[phone]. ? ?Due for AWVI ? ?Please schedule at anytime with Nurse Health Advisor. ?  ?

## 2021-08-08 ENCOUNTER — Other Ambulatory Visit: Payer: Self-pay | Admitting: Family Medicine

## 2021-08-08 DIAGNOSIS — J439 Emphysema, unspecified: Secondary | ICD-10-CM

## 2021-08-16 ENCOUNTER — Telehealth: Payer: Self-pay | Admitting: Family Medicine

## 2021-08-16 NOTE — Telephone Encounter (Signed)
Left message for patient to call back and schedule Medicare Annual Wellness Visit (AWV).   Please offer to do virtually or by telephone.  Left office number and my jabber #[phone].  AWVI eligible as of 05/12/2021   Please schedule at anytime with Nurse Health Advisor.  

## 2021-09-20 ENCOUNTER — Other Ambulatory Visit: Payer: Self-pay | Admitting: Family Medicine

## 2021-09-20 DIAGNOSIS — J439 Emphysema, unspecified: Secondary | ICD-10-CM

## 2021-11-02 ENCOUNTER — Telehealth: Payer: Self-pay | Admitting: Family Medicine

## 2021-11-02 NOTE — Telephone Encounter (Signed)
Left message for patient to call back and schedule Medicare Annual Wellness Visit (AWV).   Please offer to do virtually or by telephone.  Left office number and my jabber #[phone].  AWVI eligible as of 05/12/2021   Please schedule at anytime with Nurse Health Advisor.  

## 2021-12-13 ENCOUNTER — Other Ambulatory Visit: Payer: Self-pay | Admitting: Family Medicine

## 2021-12-13 DIAGNOSIS — J439 Emphysema, unspecified: Secondary | ICD-10-CM

## 2021-12-20 ENCOUNTER — Other Ambulatory Visit: Payer: Self-pay | Admitting: Family Medicine

## 2021-12-20 DIAGNOSIS — K219 Gastro-esophageal reflux disease without esophagitis: Secondary | ICD-10-CM

## 2021-12-20 DIAGNOSIS — F064 Anxiety disorder due to known physiological condition: Secondary | ICD-10-CM

## 2021-12-22 NOTE — Telephone Encounter (Signed)
Lft VM to rtn call to schedule a f/u appointment. Dm/cma

## 2021-12-27 NOTE — Telephone Encounter (Signed)
Lft VM to rtn call to schedule a f/u appointment. Dm/cma

## 2022-01-06 ENCOUNTER — Telehealth: Payer: Self-pay | Admitting: Family Medicine

## 2022-01-06 NOTE — Telephone Encounter (Signed)
Left message for patient to call back and schedule Medicare Annual Wellness Visit (AWV).   Please offer to do virtually or by telephone.  Left office number and my jabber [phone].  AWVI eligible as of 05/12/2021   Please schedule at anytime with Nurse Health Advisor.

## 2022-02-23 ENCOUNTER — Other Ambulatory Visit: Payer: Self-pay | Admitting: Family Medicine

## 2022-02-23 DIAGNOSIS — J439 Emphysema, unspecified: Secondary | ICD-10-CM

## 2022-03-30 ENCOUNTER — Encounter: Payer: Self-pay | Admitting: Family Medicine

## 2022-03-30 ENCOUNTER — Ambulatory Visit (INDEPENDENT_AMBULATORY_CARE_PROVIDER_SITE_OTHER): Payer: Medicare Other | Admitting: Family Medicine

## 2022-03-30 VITALS — Temp 97.4°F | Ht 76.0 in | Wt 231.4 lb

## 2022-03-30 DIAGNOSIS — I10 Essential (primary) hypertension: Secondary | ICD-10-CM

## 2022-03-30 DIAGNOSIS — L719 Rosacea, unspecified: Secondary | ICD-10-CM

## 2022-03-30 DIAGNOSIS — F17209 Nicotine dependence, unspecified, with unspecified nicotine-induced disorders: Secondary | ICD-10-CM

## 2022-03-30 DIAGNOSIS — R972 Elevated prostate specific antigen [PSA]: Secondary | ICD-10-CM | POA: Diagnosis not present

## 2022-03-30 DIAGNOSIS — E782 Mixed hyperlipidemia: Secondary | ICD-10-CM | POA: Diagnosis not present

## 2022-03-30 DIAGNOSIS — Z1211 Encounter for screening for malignant neoplasm of colon: Secondary | ICD-10-CM

## 2022-03-30 DIAGNOSIS — R195 Other fecal abnormalities: Secondary | ICD-10-CM

## 2022-03-30 DIAGNOSIS — M47816 Spondylosis without myelopathy or radiculopathy, lumbar region: Secondary | ICD-10-CM | POA: Diagnosis not present

## 2022-03-30 DIAGNOSIS — F411 Generalized anxiety disorder: Secondary | ICD-10-CM

## 2022-03-30 DIAGNOSIS — J439 Emphysema, unspecified: Secondary | ICD-10-CM | POA: Diagnosis not present

## 2022-03-30 MED ORDER — PREDNISONE 20 MG PO TABS: 20.0000 mg | ORAL_TABLET | Freq: Every day | ORAL | 0 refills | Status: DC

## 2022-03-30 MED ORDER — TRAMADOL HCL 50 MG PO TABS: ORAL_TABLET | ORAL | 0 refills | Status: DC

## 2022-03-30 MED ORDER — ALPRAZOLAM 0.5 MG PO TABS: ORAL_TABLET | ORAL | 1 refills | Status: DC

## 2022-03-30 NOTE — Progress Notes (Signed)
[facility] [address] [place] [address] Dept: [phone] Dept Fax: [phone]  Chronic Care Office Visit  Subjective:    Patient ID: [name], male    DOB: 01-30-1956, 67 y.o..   MRN: [id number]  Chief Complaint  Patient presents with   Follow-up    F/u meds.  C/o having pain in back x 2 weeks.  Has been taking Tylenol.     History of Present Illness:  Patient is in today for reassessment of chronic medical issues.  Mr. Maceachern has a history of emphysema. He is managed on albuterol. He has noted some dyspnea on exertion, such as when vacuuming in his home. He has not tried using his albuterol prior to physical activity. He continues to smoke 1 ppd of cigarettes and is not interested in quitting at present.   Mr. Chiappetta has a history of hypertension, managed with amlodipine 10 mg daily, benazepril 40 mg daily, and HCTZ 25 mg daily. He also has hyperlipidemia, managed with pravastatin 80 mg daily.   Mr. Payes has issues with both grief regarding his wife's death and with anxiety. He takes alprazolam 0.5 mg, 1 tablet daily and a 1/2 tablet as needed. He finds this helpful for the anxiety. He is also on Paxil 20 mg daily.   Mr. Kelm has a history of low back secondary to arthritis. He takes an occasional tramadol for this. He has been out of this for some time now. He notes in the past two weeks, he has had increased pain and stiffness in his lower back. He finds when he bends over at the sink, it is hard to stand back up straight. he has not had any numbness, weakness, pain or tingling in the lower legs.   Mr. Nieto has a history of BPH and a mildly elevated PSA. He is managed on Flomax 0.8 mg daily and notes he typically only gets up to urinate once a night.  Past Medical History: Patient Active Problem List   Diagnosis Date Noted   Rosacea 03/30/2022   History of endovascular stent graft for abdominal aortic  aneurysm (AAA) 01/05/2021   Atherosclerotic cardiovascular disease 08/05/2020   Arthritis 05/07/2020   Dental infection    Near syncope    Hyponatremia 05/06/2019   Pulmonary emphysema ([place]) 03/11/2019   Depression, major, single episode, moderate (HCC) 07/27/2018   Generalized anxiety disorder 07/27/2018   Elevated PSA, less than 10 ng/ml 01/17/2018   Muscle cramps 01/17/2018   Chronic kidney disease, stage 3b ([place]) 08/16/2017   Mixed dyslipidemia 06/01/2017   Tobacco use disorder, continuous 06/01/2017   Benign prostatic hyperplasia with urinary hesitancy 09/22/2015   Peripheral vascular disease, unspecified ([place]) 04/26/2011   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   Cancer of the lip, oral cavity, 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   History of colon polyps 05/02/2007   Essential hypertension 11/08/2006   GERD 11/08/2006   Past Surgical History:  Procedure Laterality Date   ABDOMINAL AORTIC ENDOVASCULAR STENT GRAFT N/A 07/17/2017   Procedure: ABDOMINAL AORTIC ENDOVASCULAR STENT GRAFT REPAIR WITH ILIAC BRANCH DEVICE;  Surgeon: [name], MD;  Location: [facility] OR;  Service: Vascular;  Laterality: N/A;   ANGIOPLASTY  09/20/10   Rt AK to tibioperoneal trunk BPG w/ vein patch angioplasty of tibioperoneal trunk    CARPAL TUNNEL RELEASE Bilateral    FOOT SURGERY     right x2   MOHS SURGERY     lower lip  PR VEIN BYPASS GRAFT,AORTO-FEM-POP     TENDON REPAIR Right 2006   Forearm   Family History  Problem Relation Age of Onset   Adrenal disorder Brother    Hyperlipidemia Brother    Hypertension Brother    Heart disease Brother        Heart Disease before age 54   Heart attack Brother    Heart disease Mother        Heart Disease before age 64   Hyperlipidemia Mother    Hypertension Mother    Heart attack Mother    Heart disease Father        Heart Disease before age 69   Hyperlipidemia Father    Heart attack Father    Pulmonary embolism Sister    Outpatient Medications Prior to  Visit  Medication Sig Dispense Refill   acetaminophen (TYLENOL) 325 MG tablet Take 650 mg by mouth every 6 (six) hours as needed for moderate pain.     albuterol (VENTOLIN HFA) 108 (90 Base) MCG/ACT inhaler INHALE 2 PUFFS INTO THE LUNGS EVERY 6 HOURS AS NEEDED FOR WHEEZING OR SHORTNESS OF BREATH 6.7 g 3   amLODipine (NORVASC) 10 MG tablet Take 1 tablet (10 mg total) by mouth daily. 90 tablet 3   ASPIRIN LOW DOSE 81 MG EC tablet TAKE 1 TABLET BY MOUTH EVERY DAY 120 tablet 1   benazepril (LOTENSIN) 40 MG tablet Take 1 tablet (40 mg total) by mouth daily. 90 tablet 3   dutasteride (AVODART) 0.5 MG capsule Take 1 capsule (0.5 mg total) by mouth daily. 90 capsule 3   hydrochlorothiazide (HYDRODIURIL) 25 MG tablet Take 1 tablet (25 mg total) by mouth daily. 90 tablet 3   magnesium 30 MG tablet Take 30 mg by mouth 2 (two) times daily.     Multiple Vitamin (MULTIVITAMIN) tablet Take 1 tablet by mouth daily.     naproxen (NAPROSYN) 500 MG tablet Take 1 tablet (500 mg total) by mouth 2 (two) times daily with a meal. 28 tablet 0   omeprazole (PRILOSEC) 20 MG capsule TAKE 1 CAPSULE BY MOUTH EVERYDAY 90 capsule 3   PARoxetine (PAXIL) 20 MG tablet Take 1 tablet (20 mg total) by mouth daily. 90 tablet 3   pravastatin (PRAVACHOL) 80 MG tablet Take 1 tablet (80 mg total) by mouth daily. 90 tablet 3   tamsulosin (FLOMAX) 0.4 MG CAPS capsule Take 1 capsule (0.4 mg total) by mouth daily. 90 capsule 3   ALPRAZolam (XANAX) 0.5 MG tablet TAKE 1 TABLET(0.5 MG) BY MOUTH TWICE DAILY AS NEEDED FOR ANXIETY 60 tablet 0   traMADol (ULTRAM) 50 MG tablet TAKE 1 TABLET(50 MG) BY MOUTH EVERY 8 HOURS AS NEEDED (Patient not taking: Reported on 03/30/2022) 24 tablet 0   No facility-administered medications prior to visit.   Allergies  Allergen Reactions   Cortisone    Fentanyl     hypotension   Sulfonamide Derivatives     REACTION: rash     Objective:   Today's Vitals   03/30/22 1539  Temp: (!) 97.4 F (36.3 C)   TempSrc: Temporal  Weight: 231 lb 6.4 oz (105 kg)  Height: '6\' 4"'$  (1.93 m)   Body mass index is 28.17 kg/m.   General: Well developed, well nourished. No acute distress. Back: Straight. Pain on palpation over the lower lumbar area and immediately adjacent paralumbar   muscles. Extremities: Strength 5/5. Neuro: Normal sensation and DTR 2+ in LE bilaterally. Skin: There are multiple cystic lesions on the  face with associated telangiectasias. There is thickening of   the nose and the skin of the forehead. There are some scattered papulopustular lesions with erythema. Psych: Alert and oriented. Normal mood and affect.  Health Maintenance Due  Topic Date Due   Medicare Annual Wellness (AWV)  Never done   Pneumonia Vaccine 41+ Years old (1 - PCV) Never done   Hepatitis C Screening  Never done   Zoster Vaccines- Shingrix (1 of 2) Never done   COLONOSCOPY (Pts 45-5yr Insurance coverage will need to be confirmed)  Never done   Lung Cancer Screening  Never done     Assessment & Plan:   1. Arthritis of lumbar spine The low back pain appears to be a flare of his chronic arthritis. He should avoid NSAIDs. I will try a 1-week course of prednisone. I will resume his tramadol.  - traMADol (ULTRAM) 50 MG tablet; TAKE 1 TABLET(50 MG) BY MOUTH EVERY 8 HOURS AS NEEDED  Dispense: 24 tablet; Refill: 0 - predniSONE (DELTASONE) 20 MG tablet; Take 1 tablet (20 mg total) by mouth daily with breakfast.  Dispense: 7 tablet; Refill: 0  2. Essential hypertension Blood pressure at goal. I will check his renal function today.  - Basic metabolic panel  3. Pulmonary emphysema, unspecified emphysema type ([place] Mildly progressive in symptoms. I recommend he premedicate before exertion with his albuterol to see if this helps.  4. Rosacea The facial changes are characteristic of rosacea. I recommend consideration for referral to dermatology. He is not interested in that at present.  5. Generalized anxiety  disorder Continue alprazolam and Paxil.  - ALPRAZolam (XANAX) 0.5 MG tablet; TAKE 1 TABLET(0.5 MG) BY MOUTH TWICE DAILY AS NEEDED FOR ANXIETY  Dispense: 60 tablet; Refill: 1  6. Tobacco use disorder, continuous I continue to encourage him to consider stopping smoking. He is agreeable to doing lung cancer screening.  - CT CHEST LUNG CA SCREEN LOW DOSE W/O CM; Future  7. Elevated PSA, less than 10 ng/ml I will reassess his PSA.  - PSA  8. Mixed dyslipidemia I will check lipids today. Continue pravastatin 80 mg daily.  - Lipid panel  9. Screening for colon cancer Refuse colonoscopy, but will consider Cologuard.  - Cologuard   Return in about 3 months (around 06/29/2022) for Reassessment.   SHaydee Salter MD

## 2022-03-31 LAB — BASIC METABOLIC PANEL
BUN: 15 mg/dL (ref 6–23)
CO2: 24 mEq/L (ref 19–32)
Calcium: 9.2 mg/dL (ref 8.4–10.5)
Chloride: 96 mEq/L (ref 96–112)
Creatinine, Ser: 1.75 mg/dL — ABNORMAL HIGH (ref 0.40–1.50)
GFR: 39.99 mL/min — ABNORMAL LOW (ref >60.00)
Glucose, Bld: 115 mg/dL — ABNORMAL HIGH (ref 70–99)
Potassium: 3.6 mEq/L (ref 3.5–5.1)
Sodium: 133 mEq/L — ABNORMAL LOW (ref 135–145)

## 2022-03-31 LAB — LIPID PANEL
Cholesterol: 173 mg/dL (ref 0–200)
HDL: 26.6 mg/dL — ABNORMAL LOW (ref >39.00)
Total CHOL/HDL Ratio: 6
Triglycerides: 576 mg/dL — ABNORMAL HIGH (ref 0.0–149.0)

## 2022-03-31 LAB — LDL CHOLESTEROL, DIRECT: Direct LDL: 60 mg/dL

## 2022-03-31 LAB — PSA: PSA: 4.55 ng/mL — ABNORMAL HIGH (ref 0.10–4.00)

## 2022-04-12 DIAGNOSIS — Z1211 Encounter for screening for malignant neoplasm of colon: Secondary | ICD-10-CM | POA: Diagnosis not present

## 2022-04-13 ENCOUNTER — Telehealth: Payer: Self-pay | Admitting: Family Medicine

## 2022-04-13 NOTE — Telephone Encounter (Signed)
Left message for patient to call back and schedule Medicare Annual Wellness Visit (AWV) either virtually or in office. Left  my Herbie Drape number [phone]   AWVI DUE 05/12/2021 PER PALMETTO please schedule with Nurse Health Adviser   45 min for awv-i  in office appointments 30 min for awv-s & awv-i phone/virtual appointments

## 2022-04-15 NOTE — Telephone Encounter (Signed)
Returned patients call   Patient returned my call 04/14/22

## 2022-04-22 DIAGNOSIS — R195 Other fecal abnormalities: Secondary | ICD-10-CM | POA: Insufficient documentation

## 2022-04-22 LAB — COLOGUARD: COLOGUARD: POSITIVE — AB

## 2022-04-22 NOTE — Addendum Note (Signed)
Addended by: Haydee Salter on: 04/22/2022 05:20 PM   Modules accepted: Orders

## 2022-04-26 ENCOUNTER — Encounter: Payer: Self-pay | Admitting: Gastroenterology

## 2022-05-02 ENCOUNTER — Ambulatory Visit
Admission: RE | Admit: 2022-05-02 | Discharge: 2022-05-02 | Disposition: A | Discharge status: Home / Self Care | Payer: Medicare Other | Source: Ambulatory Visit | Attending: Family Medicine | Admitting: Family Medicine

## 2022-05-02 DIAGNOSIS — F17209 Nicotine dependence, unspecified, with unspecified nicotine-induced disorders: Secondary | ICD-10-CM

## 2022-05-02 DIAGNOSIS — F17210 Nicotine dependence, cigarettes, uncomplicated: Secondary | ICD-10-CM | POA: Diagnosis not present

## 2022-05-03 ENCOUNTER — Encounter: Payer: Self-pay | Admitting: Family Medicine

## 2022-05-03 DIAGNOSIS — K8020 Calculus of gallbladder without cholecystitis without obstruction: Secondary | ICD-10-CM | POA: Insufficient documentation

## 2022-05-03 DIAGNOSIS — K760 Fatty (change of) liver, not elsewhere classified: Secondary | ICD-10-CM | POA: Insufficient documentation

## 2022-05-10 ENCOUNTER — Ambulatory Visit (AMBULATORY_SURGERY_CENTER): Payer: Medicare Other

## 2022-05-10 VITALS — Ht 76.0 in | Wt 230.0 lb

## 2022-05-10 DIAGNOSIS — Z1211 Encounter for screening for malignant neoplasm of colon: Secondary | ICD-10-CM

## 2022-05-10 MED ORDER — NA SULFATE-K SULFATE-MG SULF 17.5-3.13-1.6 GM/177ML PO SOLN: 1.0000 | Freq: Once | ORAL | 0 refills | Status: AC

## 2022-05-10 NOTE — Progress Notes (Signed)
No egg or soy allergy known to patient   No issues known to pt with past sedation with any surgeries or procedures  Patient denies ever being told they had issues or difficulty with intubation   No FH of Malignant Hyperthermia  Pt is not on diet pills  Pt is not on  home 02   Pt is not on blood thinners   Pt denies issues with constipation   No A fib or A flutter  Have any cardiac testing pending--no Pt instructed to use [email or web address] or GoodRx for a price reduction on prep   

## 2022-05-12 ENCOUNTER — Encounter: Payer: Self-pay | Admitting: Gastroenterology

## 2022-05-18 ENCOUNTER — Other Ambulatory Visit: Payer: Self-pay | Admitting: Family Medicine

## 2022-05-18 DIAGNOSIS — I10 Essential (primary) hypertension: Secondary | ICD-10-CM

## 2022-05-18 DIAGNOSIS — N183 Chronic kidney disease, stage 3 unspecified: Secondary | ICD-10-CM

## 2022-05-18 DIAGNOSIS — E782 Mixed hyperlipidemia: Secondary | ICD-10-CM

## 2022-05-29 ENCOUNTER — Other Ambulatory Visit: Payer: Self-pay | Admitting: Family Medicine

## 2022-05-29 DIAGNOSIS — J439 Emphysema, unspecified: Secondary | ICD-10-CM

## 2022-06-01 ENCOUNTER — Encounter: Payer: Self-pay | Admitting: Certified Registered Nurse Anesthetist

## 2022-06-02 ENCOUNTER — Ambulatory Visit (AMBULATORY_SURGERY_CENTER): Payer: Medicare Other | Admitting: Gastroenterology

## 2022-06-02 ENCOUNTER — Encounter: Payer: Self-pay | Admitting: Gastroenterology

## 2022-06-02 VITALS — BP 120/80 | HR 81 | Temp 98.0°F | Resp 13 | Ht 76.0 in | Wt 230.0 lb

## 2022-06-02 DIAGNOSIS — D120 Benign neoplasm of cecum: Secondary | ICD-10-CM

## 2022-06-02 DIAGNOSIS — Z1211 Encounter for screening for malignant neoplasm of colon: Secondary | ICD-10-CM | POA: Diagnosis not present

## 2022-06-02 DIAGNOSIS — D122 Benign neoplasm of ascending colon: Secondary | ICD-10-CM | POA: Diagnosis not present

## 2022-06-02 DIAGNOSIS — K635 Polyp of colon: Secondary | ICD-10-CM | POA: Diagnosis not present

## 2022-06-02 DIAGNOSIS — D123 Benign neoplasm of transverse colon: Secondary | ICD-10-CM

## 2022-06-02 DIAGNOSIS — D124 Benign neoplasm of descending colon: Secondary | ICD-10-CM | POA: Diagnosis not present

## 2022-06-02 LAB — HM COLONOSCOPY

## 2022-06-02 MED ORDER — SODIUM CHLORIDE 0.9 % IV SOLN: 500.0000 mL | Freq: Once | INTRAVENOUS | Status: DC

## 2022-06-02 NOTE — Progress Notes (Signed)
Called to room to assist during endoscopic procedure.  Patient ID and intended procedure confirmed with present staff. Received instructions for my participation in the procedure from the performing physician.  

## 2022-06-02 NOTE — Progress Notes (Signed)
Report given to PACU, vss 

## 2022-06-02 NOTE — Progress Notes (Signed)
Pt very anxious upon arrival to admitting  Per pt- had a seizure 2 years ago- none since  Pt's states no medical or surgical changes since previsit or office visit.

## 2022-06-02 NOTE — Op Note (Addendum)
[facility] Patient Name: [name] Procedure Date: 06/02/2022 3:44 PM MRN: [id number] Endoscopist: Mallie Mussel L. Loletha Carrow , MD, [id number] Age: 67 Referring MD:  Date of Birth: 04-14-1955 Gender: Male Account #: [id number] Procedure:                Colonoscopy Indications:              Screening for colorectal malignant neoplasm, This                            is the patient's first colonoscopy Medicines:                Monitored Anesthesia Care Procedure:                Pre-Anesthesia Assessment:                           - Prior to the procedure, a History and Physical                            was performed, and patient medications and                            allergies were reviewed. The patient's tolerance of                            previous anesthesia was also reviewed. The risks                            and benefits of the procedure and the sedation                            options and risks were discussed with the patient.                            All questions were answered, and informed consent                            was obtained. Prior Anticoagulants: The patient has                            taken no anticoagulant or antiplatelet agents. ASA                            Grade Assessment: III - A patient with severe                            systemic disease. After reviewing the risks and                            benefits, the patient was deemed in satisfactory                            condition to undergo the procedure.  After obtaining informed consent, the colonoscope                            was passed under direct vision. Throughout the                            procedure, the patient's blood pressure, pulse, and                            oxygen saturations were monitored continuously. The                            CF [id number] was introduced through the anus                            and advanced to the  the cecum, identified by                            appendiceal orifice and ileocecal valve. The                            colonoscopy was extremely difficult due to fair                            quality bowel prep in some areas despite lavage, a                            redundant colon, significant looping, a tortuous                            colon and the patient's body habitus as well as                            abdominal breathing (anesthesia placeda nasal                            trumpet to help with this). Successful completion                            of the procedure was aided by changing the                            patient's position, using manual pressure,                            straightening and shortening the scope to obtain                            bowel loop reduction and lavage. The patient                            tolerated the procedure fairly well. Scope In: 3:57:03 PM Scope Out: 4:42:19 PM Scope Withdrawal Time: 0 hours 41 minutes 9 seconds  Total Procedure Duration: 0 hours 45 minutes 16 seconds  Findings:                 The perianal and digital rectal examinations were                            normal.                           Repeat examination of right colon under NBI                            performed.                           Four sessile polyps were found in the cecum. The                            polyps were 4 to 10 mm in size. These polyps were                            removed with a cold snare. Resection and retrieval                            were complete.                           Four sessile polyps were found in the ascending                            colon. The polyps were 4 to 10 mm in size. These                            polyps were removed with a cold snare. Resection                            and retrieval were complete.                           Three sessile polyps were found in the transverse                             colon. The polyps were 6 to 8 mm in size. These                            polyps were removed with a cold snare. Resection                            and retrieval were complete.                           A 12 mm polyp was found in the transverse colon.  The polyp was sessile. The polyp was removed with a                            hot snare. Resection and retrieval were complete.                           Three sessile polyps were found in the transverse                            colon. The polyps were 6 to 10 mm in size. These                            polyps were removed with a cold snare. Resection                            and retrieval were complete.                           Many diverticula were found in the left colon.                           The sigmoid colon was tortuous and redundant.                           Internal hemorrhoids were found.                           The exam was otherwise without abnormality on                            direct and retroflexion views. Complications:            No immediate complications. Estimated Blood Loss:     Estimated blood loss was minimal. Impression:               - Four 4 to 10 mm polyps in the cecum, removed with                            a cold snare. Resected and retrieved.                           - Four 4 to 10 mm polyps in the ascending colon,                            removed with a cold snare. Resected and retrieved.                           - Three 6 to 8 mm polyps in the transverse colon,                            removed with a cold snare. Resected and retrieved.                           -  One 12 mm polyp in the transverse colon, removed                            with a hot snare. Resected and retrieved.                           - Three 6 to 10 mm polyps in the transverse colon,                            removed with a cold snare. Resected and retrieved.                            - Diverticulosis in the left colon.                           - Tortuous colon.                           - Internal hemorrhoids.                           - The examination was otherwise normal on direct                            and retroflexion views.                           - The GI Genius (intelligent endoscopy module),                            computer-aided polyp detection system powered by AI                            was utilized to detect colorectal polyps through                            enhanced visualization during colonoscopy. Recommendation:           - Patient has a contact number available for                            emergencies. The signs and symptoms of potential                            delayed complications were discussed with the                            patient. Return to normal activities tomorrow.                            Written discharge instructions were provided to the                            patient.                           -  Resume previous diet.                           - Continue present medications.                           - Await pathology results.                           - Repeat colonoscopy in 1 year for surveillance.                            Golytely prep for next colonoscopy.                           - Please. Stop. Smoking. Vanderbilt Ranieri L. Loletha Carrow, MD 06/02/2022 4:50:46 PM This report has been signed electronically.

## 2022-06-02 NOTE — Progress Notes (Signed)
1625 Nasopharyngeal airway size  7.0 placed without trauma due to severe airway obstruction. vss

## 2022-06-02 NOTE — Progress Notes (Signed)
History and Physical:  This patient presents for endoscopic testing for: Encounter Diagnosis  Name Primary?   Special screening for malignant neoplasms, colon Yes    Average risk for colorectal cancer.  First screening exam. Patient denies chronic abdominal pain, rectal bleeding, constipation or diarrhea. smoker  Patient is otherwise without complaints or active issues today.   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   PONV (postoperative nausea and vomiting)    Seizures ([place])    from concussion several months ago has been having headache   Sepsis ([place]) 05/05/2019   Weak urine stream    Slow to get started     Past Surgical History: Past Surgical History:  Procedure Laterality Date   ABDOMINAL AORTIC ENDOVASCULAR STENT GRAFT N/A 07/17/2017   Procedure: ABDOMINAL AORTIC ENDOVASCULAR STENT GRAFT REPAIR WITH ILIAC BRANCH DEVICE;  Surgeon: [name], MD;  Location: [facility] OR;  Service: Vascular;  Laterality: N/A;   ANGIOPLASTY  09/20/10   Rt AK to tibioperoneal trunk BPG w/ vein patch angioplasty of tibioperoneal trunk    CARPAL TUNNEL RELEASE Bilateral    FOOT SURGERY     right x2   MOHS SURGERY     lower lip   PR VEIN BYPASS GRAFT,AORTO-FEM-POP     TENDON REPAIR Right 2006   Forearm    Allergies: Allergies  Allergen Reactions   Cortisone    Fentanyl     hypotension   Sulfonamide Derivatives     REACTION:  rash    Outpatient Meds: Current Outpatient Medications  Medication Sig Dispense Refill   acetaminophen (TYLENOL) 325 MG tablet Take 650 mg by mouth every 6 (six) hours as needed for moderate pain.     albuterol (VENTOLIN HFA) 108 (90 Base) MCG/ACT inhaler INHALE 2 PUFFS INTO THE LUNGS EVERY 6 HOURS AS NEEDED FOR WHEEZING OR SHORTNESS OF BREATH 6.7 g 3   ALPRAZolam (XANAX) 0.5 MG tablet TAKE 1 TABLET(0.5 MG) BY MOUTH TWICE DAILY AS NEEDED FOR ANXIETY 60 tablet 1   amLODipine (NORVASC) 10 MG tablet Take 1 tablet (10 mg total) by mouth daily. 90 tablet 3   ASPIRIN LOW DOSE 81 MG EC tablet TAKE 1 TABLET BY MOUTH EVERY DAY 120 tablet 1   benazepril (LOTENSIN) 40 MG tablet TAKE 1 TABLET(40 MG) BY MOUTH DAILY 90 tablet 2   dutasteride (AVODART) 0.5 MG capsule Take 1 capsule (0.5 mg total) by mouth daily. 90 capsule 3   hydrochlorothiazide (HYDRODIURIL) 25 MG tablet Take 1 tablet (25 mg total) by mouth daily. 90 tablet 3   Multiple Vitamin (MULTIVITAMIN) tablet Take 1 tablet by mouth daily.     omeprazole (PRILOSEC) 20 MG capsule TAKE 1 CAPSULE BY MOUTH EVERYDAY 90 capsule 3   PARoxetine (PAXIL) 20 MG tablet Take 1 tablet (20 mg total) by mouth daily. 90 tablet 3   pravastatin (PRAVACHOL) 80 MG tablet TAKE  1 TABLET(80 MG) BY MOUTH DAILY 90 tablet 2   predniSONE (DELTASONE) 20 MG tablet Take 1 tablet (20 mg total) by mouth daily with breakfast. 7 tablet 0   tamsulosin (FLOMAX) 0.4 MG CAPS capsule Take 1 capsule (0.4 mg total) by mouth daily. 90 capsule 3   magnesium 30 MG tablet Take 30 mg by mouth 2 (two) times daily. (Patient not taking: Reported on 05/10/2022)     naproxen (NAPROSYN) 500 MG tablet Take 1 tablet (500 mg total) by mouth 2 (two) times daily with a meal. (Patient not taking: Reported on 05/10/2022) 28 tablet 0   traMADol (ULTRAM) 50 MG tablet TAKE 1 TABLET(50 MG) BY MOUTH EVERY 8 HOURS AS NEEDED (Patient not taking: Reported on 06/02/2022) 24 tablet 0   Current Facility-Administered  Medications  Medication Dose Route Frequency Provider Last Rate Last Admin   0.9 %  sodium chloride infusion  500 mL Intravenous Once Doran Stabler, MD          ___________________________________________________________________ Objective   Exam:  BP 130/73   Pulse (!) 101   Temp 98 F (36.7 C)   Ht 6\' 4"  (1.93 m)   Wt 230 lb (104.3 kg)   SpO2 94%   BMI 28.00 kg/m   CV: regular , S1/S2 Resp: clear to auscultation bilaterally, normal RR and effort noted GI: soft, no tenderness, with active bowel sounds.   Assessment: Encounter Diagnosis  Name Primary?   Special screening for malignant neoplasms, colon Yes     Plan: Colonoscopy  The benefits and risks of the planned procedure were described in detail with the patient or (when appropriate) their health care proxy.  Risks were outlined as including, but not limited to, bleeding, infection, perforation, adverse medication reaction leading to cardiac or pulmonary decompensation, pancreatitis (if ERCP).  The limitation of incomplete mucosal visualization was also discussed.  No guarantees or warranties were given.    The patient is appropriate for an endoscopic procedure in the ambulatory setting.   - Wilfrid Lund, MD

## 2022-06-02 NOTE — Progress Notes (Signed)
1612 Albuterol neb given, vss

## 2022-06-02 NOTE — Patient Instructions (Signed)
-  Handout on polyps, hemorrhoids and diverticulosis provided -await pathology results -repeat colonoscopy 1 year for  surveillance recommended.  -Continue present medications  -Please. STOP SMOKING  YOU HAD AN ENDOSCOPIC PROCEDURE TODAY AT [facility]:   Refer to the procedure report that was given to you for any specific questions about what was found during the examination.  If the procedure report does not answer your questions, please call your gastroenterologist to clarify.  If you requested that your care partner not be given the details of your procedure findings, then the procedure report has been included in a sealed envelope for you to review at your convenience later.  YOU SHOULD EXPECT: Some feelings of bloating in the abdomen. Passage of more gas than usual.  Walking can help get rid of the air that was put into your GI tract during the procedure and reduce the bloating. If you had a lower endoscopy (such as a colonoscopy or flexible sigmoidoscopy) you may notice spotting of blood in your stool or on the toilet paper. If you underwent a bowel prep for your procedure, you may not have a normal bowel movement for a few days.  Please Note:  You might notice some irritation and congestion in your nose or some drainage.  This is from the oxygen used during your procedure.  There is no need for concern and it should clear up in a day or so.  SYMPTOMS TO REPORT IMMEDIATELY:  Following lower endoscopy (colonoscopy or flexible sigmoidoscopy):  Excessive amounts of blood in the stool  Significant tenderness or worsening of abdominal pains  Swelling of the abdomen that is new, acute  Fever of 100F or higher   For urgent or emergent issues, a gastroenterologist can be reached at any hour by calling [phone]. Do not use MyChart messaging for urgent concerns.    DIET:  We do recommend a small meal at first, but then you may proceed to your regular diet.  Drink plenty of  fluids but you should avoid alcoholic beverages for 24 hours.  ACTIVITY:  You should plan to take it easy for the rest of today and you should NOT DRIVE or use heavy machinery until tomorrow (because of the sedation medicines used during the test).    FOLLOW UP: Our staff will call the number listed on your records the next business day following your procedure.  We will call around 7:15- 8:00 am to check on you and address any questions or concerns that you may have regarding the information given to you following your procedure. If we do not reach you, we will leave a message.     If any biopsies were taken you will be contacted by phone or by letter within the next 1-3 weeks.  Please call us at [phone] if you have not heard about the biopsies in 3 weeks.    SIGNATURES/CONFIDENTIALITY: You and/or your care partner have signed paperwork which will be entered into your electronic medical record.  These signatures attest to the fact that that the information above on your After Visit Summary has been reviewed and is understood.  Full responsibility of the confidentiality of this discharge information lies with you and/or your care-partner.

## 2022-06-03 ENCOUNTER — Telehealth: Payer: Self-pay | Admitting: *Deleted

## 2022-06-03 NOTE — Telephone Encounter (Signed)
  Follow up Call-     06/02/2022    2:59 PM  Call back number  Post procedure Call Back phone  # [phone]  Permission to leave phone message Yes     Patient questions:   Message left to call us if necessary.

## 2022-06-09 ENCOUNTER — Encounter: Payer: Self-pay | Admitting: Gastroenterology

## 2022-06-13 ENCOUNTER — Other Ambulatory Visit: Payer: Self-pay | Admitting: Family Medicine

## 2022-06-13 DIAGNOSIS — N401 Enlarged prostate with lower urinary tract symptoms: Secondary | ICD-10-CM

## 2022-06-14 ENCOUNTER — Telehealth: Payer: Self-pay | Admitting: Family Medicine

## 2022-06-14 NOTE — Telephone Encounter (Signed)
Called patient to schedule Medicare Annual Wellness Visit (AWV). Left message for patient to call back and schedule Medicare Annual Wellness Visit (AWV).  Last date of AWV: -AWVI DUE 05/12/2021 PER PALMETTO  Please schedule an appointment at any time with [facility] [place].  If any questions, please contact me at [phone].  Thank you ,  Rbbin [phone]

## 2022-06-26 ENCOUNTER — Other Ambulatory Visit: Payer: Self-pay | Admitting: Family Medicine

## 2022-06-26 DIAGNOSIS — N401 Enlarged prostate with lower urinary tract symptoms: Secondary | ICD-10-CM

## 2022-06-26 DIAGNOSIS — F411 Generalized anxiety disorder: Secondary | ICD-10-CM

## 2022-07-24 ENCOUNTER — Other Ambulatory Visit: Payer: Self-pay | Admitting: Family Medicine

## 2022-07-24 DIAGNOSIS — I10 Essential (primary) hypertension: Secondary | ICD-10-CM

## 2022-08-18 ENCOUNTER — Other Ambulatory Visit: Payer: Self-pay | Admitting: Family Medicine

## 2022-08-18 DIAGNOSIS — I10 Essential (primary) hypertension: Secondary | ICD-10-CM

## 2022-08-21 ENCOUNTER — Other Ambulatory Visit: Payer: Self-pay | Admitting: Family Medicine

## 2022-08-21 DIAGNOSIS — J439 Emphysema, unspecified: Secondary | ICD-10-CM

## 2022-08-21 DIAGNOSIS — F321 Major depressive disorder, single episode, moderate: Secondary | ICD-10-CM

## 2022-10-02 ENCOUNTER — Other Ambulatory Visit: Payer: Self-pay | Admitting: Family Medicine

## 2022-10-02 DIAGNOSIS — K219 Gastro-esophageal reflux disease without esophagitis: Secondary | ICD-10-CM

## 2022-11-13 ENCOUNTER — Other Ambulatory Visit: Payer: Self-pay | Admitting: Family Medicine

## 2022-11-13 DIAGNOSIS — J439 Emphysema, unspecified: Secondary | ICD-10-CM

## 2022-11-13 DIAGNOSIS — I10 Essential (primary) hypertension: Secondary | ICD-10-CM

## 2022-11-18 ENCOUNTER — Other Ambulatory Visit: Payer: Self-pay | Admitting: Family Medicine

## 2022-11-18 DIAGNOSIS — E782 Mixed hyperlipidemia: Secondary | ICD-10-CM

## 2022-11-18 DIAGNOSIS — I10 Essential (primary) hypertension: Secondary | ICD-10-CM

## 2022-11-18 DIAGNOSIS — N183 Chronic kidney disease, stage 3 unspecified: Secondary | ICD-10-CM

## 2022-11-30 ENCOUNTER — Other Ambulatory Visit: Payer: Self-pay | Admitting: Family Medicine

## 2022-11-30 DIAGNOSIS — F411 Generalized anxiety disorder: Secondary | ICD-10-CM

## 2022-12-01 NOTE — Telephone Encounter (Signed)
Refill request for  Alprazolam 0.5 mg  LR 06/27/22, #60, 2 rf LOV 03/30/22 FOV  none scheduled.  Please review and advise.  Thanks. Dm/cma

## 2023-01-28 ENCOUNTER — Other Ambulatory Visit: Payer: Self-pay | Admitting: Family Medicine

## 2023-01-28 DIAGNOSIS — F411 Generalized anxiety disorder: Secondary | ICD-10-CM

## 2023-02-02 ENCOUNTER — Telehealth: Payer: Self-pay

## 2023-02-02 NOTE — Telephone Encounter (Signed)
Lft VM to rtn call to schedule a follow up appointment with provider. Dm/cma

## 2023-02-13 ENCOUNTER — Other Ambulatory Visit: Payer: Self-pay | Admitting: Family Medicine

## 2023-02-13 DIAGNOSIS — J439 Emphysema, unspecified: Secondary | ICD-10-CM

## 2023-04-02 ENCOUNTER — Other Ambulatory Visit: Payer: Self-pay | Admitting: Family Medicine

## 2023-04-02 DIAGNOSIS — F411 Generalized anxiety disorder: Secondary | ICD-10-CM

## 2023-04-03 NOTE — Telephone Encounter (Signed)
Refill request for  Alprazolam 0.5 mg LR  01/30/23, #60,0 rf LOV  03/31/23 FOV  None scheduled    Please review and advise.  Thanks. Dm/cma

## 2023-05-11 ENCOUNTER — Encounter: Payer: Self-pay | Admitting: Gastroenterology

## 2023-05-12 ENCOUNTER — Other Ambulatory Visit: Payer: Self-pay | Admitting: Family Medicine

## 2023-05-12 DIAGNOSIS — N401 Enlarged prostate with lower urinary tract symptoms: Secondary | ICD-10-CM

## 2023-05-13 ENCOUNTER — Other Ambulatory Visit: Payer: Self-pay | Admitting: Family Medicine

## 2023-05-13 DIAGNOSIS — I10 Essential (primary) hypertension: Secondary | ICD-10-CM

## 2023-05-13 DIAGNOSIS — N401 Enlarged prostate with lower urinary tract symptoms: Secondary | ICD-10-CM

## 2023-05-14 ENCOUNTER — Other Ambulatory Visit: Payer: Self-pay | Admitting: Family Medicine

## 2023-05-14 DIAGNOSIS — J439 Emphysema, unspecified: Secondary | ICD-10-CM

## 2023-05-15 NOTE — Telephone Encounter (Signed)
 Left VM to rtn call to schedule a f/u appt. Dm/cma

## 2023-05-17 NOTE — Telephone Encounter (Signed)
 Left VM to rtn call to schedule a f/u appt. Dm/cma

## 2023-06-07 ENCOUNTER — Encounter: Payer: Self-pay | Admitting: Family Medicine

## 2023-06-07 ENCOUNTER — Ambulatory Visit (INDEPENDENT_AMBULATORY_CARE_PROVIDER_SITE_OTHER): Admitting: Family Medicine

## 2023-06-07 VITALS — BP 136/80 | HR 100 | Temp 97.9°F | Ht 76.0 in | Wt 227.4 lb

## 2023-06-07 DIAGNOSIS — N1832 Chronic kidney disease, stage 3b: Secondary | ICD-10-CM

## 2023-06-07 DIAGNOSIS — J439 Emphysema, unspecified: Secondary | ICD-10-CM

## 2023-06-07 DIAGNOSIS — I10 Essential (primary) hypertension: Secondary | ICD-10-CM | POA: Diagnosis not present

## 2023-06-07 DIAGNOSIS — D170 Benign lipomatous neoplasm of skin and subcutaneous tissue of head, face and neck: Secondary | ICD-10-CM | POA: Insufficient documentation

## 2023-06-07 DIAGNOSIS — F321 Major depressive disorder, single episode, moderate: Secondary | ICD-10-CM | POA: Diagnosis not present

## 2023-06-07 DIAGNOSIS — F17209 Nicotine dependence, unspecified, with unspecified nicotine-induced disorders: Secondary | ICD-10-CM

## 2023-06-07 DIAGNOSIS — E782 Mixed hyperlipidemia: Secondary | ICD-10-CM

## 2023-06-07 DIAGNOSIS — R972 Elevated prostate specific antigen [PSA]: Secondary | ICD-10-CM

## 2023-06-07 DIAGNOSIS — L719 Rosacea, unspecified: Secondary | ICD-10-CM

## 2023-06-07 DIAGNOSIS — L720 Epidermal cyst: Secondary | ICD-10-CM | POA: Insufficient documentation

## 2023-06-07 DIAGNOSIS — I739 Peripheral vascular disease, unspecified: Secondary | ICD-10-CM

## 2023-06-07 DIAGNOSIS — K635 Polyp of colon: Secondary | ICD-10-CM | POA: Insufficient documentation

## 2023-06-07 DIAGNOSIS — K219 Gastro-esophageal reflux disease without esophagitis: Secondary | ICD-10-CM

## 2023-06-07 MED ORDER — TRELEGY ELLIPTA 100-62.5-25 MCG/ACT IN AEPB: 1.0000 | INHALATION_SPRAY | Freq: Every day | RESPIRATORY_TRACT | 11 refills | Status: AC

## 2023-06-07 MED ORDER — PAROXETINE HCL 20 MG PO TABS: 20.0000 mg | ORAL_TABLET | Freq: Every day | ORAL | 3 refills | Status: AC

## 2023-06-07 MED ORDER — HYDROCHLOROTHIAZIDE 25 MG PO TABS: 25.0000 mg | ORAL_TABLET | Freq: Every day | ORAL | 2 refills | Status: DC

## 2023-06-07 MED ORDER — PRAVASTATIN SODIUM 80 MG PO TABS
80.0000 mg | ORAL_TABLET | Freq: Every day | ORAL | 3 refills | Status: AC
Start: 2023-06-07 — End: ?

## 2023-06-07 MED ORDER — AMLODIPINE BESYLATE 10 MG PO TABS: 10.0000 mg | ORAL_TABLET | Freq: Every day | ORAL | 3 refills | Status: AC

## 2023-06-07 MED ORDER — OMEPRAZOLE 20 MG PO CPDR
DELAYED_RELEASE_CAPSULE | ORAL | 3 refills | Status: AC
Start: 2023-06-07 — End: ?

## 2023-06-07 MED ORDER — BENAZEPRIL HCL 40 MG PO TABS: 40.0000 mg | ORAL_TABLET | Freq: Every day | ORAL | 3 refills | Status: DC

## 2023-06-07 NOTE — Assessment & Plan Note (Signed)
 Continue paroxetine 20 mg daily

## 2023-06-07 NOTE — Assessment & Plan Note (Signed)
 [name] is having claudication. He is on lipid lowering therapy, but continues to smoke 1 ppd. It raises concerns for progressive PAD. I will refer him back to vascular surgery for an assessment.

## 2023-06-07 NOTE — Assessment & Plan Note (Signed)
 Strongly advise smoking cessation. He remains pre-contemplative and not ready to make this change, despite voicing an understanding of the way this impacts various of his health issues.

## 2023-06-07 NOTE — Assessment & Plan Note (Signed)
 Mr. Ertl is having progressive DOE. I will add Trelegy to his PRN albuterol.

## 2023-06-07 NOTE — Assessment & Plan Note (Signed)
 I will refer to dermatology to consider approaches to removal of these.

## 2023-06-07 NOTE — Assessment & Plan Note (Signed)
I will check renal labs today. Continue focus on blood pressure control, adequate hydration, and avoidance of nephrotoxic medications.

## 2023-06-07 NOTE — Assessment & Plan Note (Signed)
 This has been stable over the past several years. I will reassess his PSA today.

## 2023-06-07 NOTE — Assessment & Plan Note (Signed)
 This may be the underlying issue with skin changes on [name] face. I will have dermatology assess.

## 2023-06-07 NOTE — Assessment & Plan Note (Signed)
 Last colonoscopy was about a year ago. Dr. Myrtie Neither recommended a 1 year repeat colonoscopy. [name]  declines to have this performed.

## 2023-06-07 NOTE — Progress Notes (Signed)
 [facility] [address] [place] [address] Dept: [phone] Dept Fax: [phone]  Chronic Care Office Visit  Subjective:    Patient ID: [name], male    DOB: [birth year], 68 y.o..   MRN: [id number]  Chief Complaint  Patient presents with   Hypertension    F/u HTN.  No concerns.  ?dermatology referral.     History of Present Illness:  Patient is in today for reassessment of chronic medical issues.  Mr. Zehnder has a history of emphysema. He is managed on albuterol. He admits to dyspnea on exertion, such as when vacuuming in his home. He feels his albuterol is not as effective as it was previously. He continues to smoke 1 ppd of cigarettes and is not interested in quitting at present.   Mr. Bugaj has a history of hypertension, managed with amlodipine 10 mg daily, benazepril 40 mg daily, and HCTZ 25 mg daily. He also has hyperlipidemia, managed with pravastatin 80 mg daily. He has a history of PVD, a previous aorto-femoral-popliteal bypass and a prior endovascular splint for an abdominal aortic aneurysm. He admits to some claudication with walking.   Mr. Erdahl has anxiety and depression. He takes alprazolam 0.5 mg, 1 tablet daily and a 1/2 tablet as needed. He finds this helpful for the anxiety. He is also on Paxil 20 mg daily.  Mr. Bills has a history of BPH and a mildly elevated PSA. He is no longer taking tamsulosin.  Mr. Dubie notes he has several raised lesions on his face. He would like to see a dermatologist regarding these.  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    Arthritis 05/07/2020   Dental infection    Near syncope    Hyponatremia 05/06/2019   Pulmonary emphysema (HCC) 03/11/2019   Depression, major, single episode, moderate (HCC) 07/27/2018   Generalized anxiety disorder 07/27/2018   Elevated PSA, less than 10 ng/ml 01/17/2018   Muscle cramps 01/17/2018   Chronic kidney disease, stage 3b (HCC) 08/16/2017   Mixed dyslipidemia 06/01/2017   Tobacco use disorder, continuous 06/01/2017   Benign prostatic hyperplasia with urinary hesitancy 09/22/2015   Peripheral vascular disease, unspecified (HCC) 04/26/2011   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   Cancer of the lip, oral cavity, 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   History of colon polyps 05/02/2007   Essential hypertension 11/08/2006   GERD 11/08/2006   Past Surgical History:  Procedure Laterality Date   ABDOMINAL AORTIC ENDOVASCULAR STENT GRAFT N/A 07/17/2017   Procedure: ABDOMINAL AORTIC ENDOVASCULAR STENT GRAFT REPAIR WITH ILIAC BRANCH DEVICE;  Surgeon: Larina Earthly, MD;  Location: [facility] OR;  Service: Vascular;  Laterality: N/A;   ANGIOPLASTY  09/20/10   Rt AK to tibioperoneal trunk BPG w/ vein patch angioplasty of tibioperoneal trunk    CARPAL TUNNEL RELEASE Bilateral    FOOT SURGERY     right x2   MOHS SURGERY     lower lip   PR VEIN BYPASS GRAFT,AORTO-FEM-POP     TENDON REPAIR Right 2006   Forearm   Family History  Problem Relation Age of Onset   Heart disease Mother  Heart Disease before age 9   Hyperlipidemia Mother    Hypertension Mother    Heart attack Mother    Heart disease Father        Heart Disease before age 48   Hyperlipidemia Father    Heart attack Father    Pulmonary embolism Sister    Adrenal disorder Brother    Hyperlipidemia Brother    Hypertension Brother    Heart disease Brother        Heart Disease before age 31   Heart attack Brother    Colon cancer Neg Hx    Colon polyps Neg Hx    Esophageal cancer Neg Hx    Stomach cancer Neg Hx    Rectal  cancer Neg Hx    Outpatient Medications Prior to Visit  Medication Sig Dispense Refill   acetaminophen (TYLENOL) 325 MG tablet Take 650 mg by mouth every 6 (six) hours as needed for moderate pain.     albuterol (VENTOLIN HFA) 108 (90 Base) MCG/ACT inhaler INHALE 2 PUFFS INTO THE LUNGS EVERY 6 HOURS AS NEEDED FOR WHEEZING OR SHORTNESS OF BREATH 6.7 g 3   ALPRAZolam (XANAX) 0.5 MG tablet TAKE 1 TABLET(0.5 MG) BY MOUTH TWICE DAILY AS NEEDED FOR ANXIETY 60 tablet 3   ASPIRIN LOW DOSE 81 MG EC tablet TAKE 1 TABLET BY MOUTH EVERY DAY 120 tablet 1   dutasteride (AVODART) 0.5 MG capsule TAKE 1 CAPSULE(0.5 MG) BY MOUTH DAILY 90 capsule 3   Multiple Vitamin (MULTIVITAMIN) tablet Take 1 tablet by mouth daily.     amLODipine (NORVASC) 10 MG tablet TAKE 1 TABLET(10 MG) BY MOUTH DAILY 90 tablet 3   benazepril (LOTENSIN) 40 MG tablet TAKE 1 TABLET(40 MG) BY MOUTH DAILY 90 tablet 3   hydrochlorothiazide (HYDRODIURIL) 25 MG tablet TAKE 1 TABLET(25 MG) BY MOUTH DAILY 30 tablet 0   omeprazole (PRILOSEC) 20 MG capsule TAKE 1 CAPSULE BY MOUTH EVERY DAY 90 capsule 3   PARoxetine (PAXIL) 20 MG tablet TAKE 1 TABLET(20 MG) BY MOUTH DAILY 90 tablet 3   pravastatin (PRAVACHOL) 80 MG tablet TAKE 1 TABLET(80 MG) BY MOUTH DAILY 90 tablet 3   magnesium 30 MG tablet Take 30 mg by mouth 2 (two) times daily. (Patient not taking: Reported on 06/07/2023)     naproxen (NAPROSYN) 500 MG tablet Take 1 tablet (500 mg total) by mouth 2 (two) times daily with a meal. (Patient not taking: Reported on 05/10/2022) 28 tablet 0   predniSONE (DELTASONE) 20 MG tablet Take 1 tablet (20 mg total) by mouth daily with breakfast. 7 tablet 0   tamsulosin (FLOMAX) 0.4 MG CAPS capsule TAKE 1 CAPSULE(0.4 MG) BY MOUTH DAILY (Patient not taking: Reported on 06/07/2023) 30 capsule 0   traMADol (ULTRAM) 50 MG tablet TAKE 1 TABLET(50 MG) BY MOUTH EVERY 8 HOURS AS NEEDED (Patient not taking: Reported on 06/02/2022) 24 tablet 0   No facility-administered  medications prior to visit.   Allergies  Allergen Reactions   Cortisone    Fentanyl     hypotension   Sulfonamide Derivatives     REACTION: rash   Objective:   Today's Vitals   06/07/23 1512  BP: 136/80  Pulse: 100  Temp: 97.9 F (36.6 C)  TempSrc: Temporal  SpO2: 94%  Weight: 227 lb 6.4 oz (103.1 kg)  Height: 6\' 4"  (1.93 m)   Body mass index is 27.68 kg/m.   General: Well developed, well nourished. No acute distress. Skin: There is a coarsening of the  skin of the face, with multiple comedones present. There are also   multiple raised cystic lesions, some of which are pedunculated on the face. There is a 8 cm soft, smooth   mobil subcutaneous mass over the right posterior neck. Psych: Alert and oriented. Normal mood and affect.  Health Maintenance Due  Topic Date Due   Medicare Annual Wellness (AWV)  Never done   Pneumonia Vaccine 93+ Years old (1 of 2 - PCV) Never done   Hepatitis C Screening  Never done   Zoster Vaccines- Shingrix (1 of 2) Never done   Lung Cancer Screening  05/03/2023   Colonoscopy  06/02/2023     Assessment & Plan:   Problem List Items Addressed This Visit       Cardiovascular and Mediastinum   Essential hypertension   Blood pressure is mildly high. Continue amlodipine 10 mg daily, benazepril 40 mg daily, and HCTZ 25 mg daily. I will check annual renal labs. Strongly advise smoking cessation.      Relevant Medications   amLODipine (NORVASC) 10 MG tablet   benazepril (LOTENSIN) 40 MG tablet   hydrochlorothiazide (HYDRODIURIL) 25 MG tablet   pravastatin (PRAVACHOL) 80 MG tablet   Other Relevant Orders   Basic metabolic panel   Peripheral vascular disease, unspecified (HCC) - Primary   Mr. Ellen is having claudication. He is on lipid lowering therapy, but continues to smoke 1 ppd. It raises concerns for progressive PAD. I will refer him back to vascular surgery for an assessment.      Relevant Medications   amLODipine (NORVASC) 10 MG  tablet   benazepril (LOTENSIN) 40 MG tablet   hydrochlorothiazide (HYDRODIURIL) 25 MG tablet   pravastatin (PRAVACHOL) 80 MG tablet   Other Relevant Orders   Ambulatory referral to Vascular Surgery     Respiratory   Pulmonary emphysema [facility])   Mr. Lagrange is having progressive DOE. I will add Trelegy to his PRN albuterol.      Relevant Medications   Fluticasone-Umeclidin-Vilant (TRELEGY ELLIPTA) 100-62.5-25 MCG/ACT AEPB   Other Relevant Orders   CBC     Digestive   Colon polyps   Last colonoscopy was about a year ago. Dr. Myrtie Neither recommended a 1 year repeat colonoscopy. Mr. Caddell  declines to have this performed.      GERD   Stable. Continue omeprazole 20 mg daily.      Relevant Medications   omeprazole (PRILOSEC) 20 MG capsule   Other Relevant Orders   CBC     Musculoskeletal and Integument   Epidermal cyst of face   I will refer to dermatology to consider approaches to removal of these.      Relevant Orders   Ambulatory referral to Dermatology   Lipoma of neck   Mass consistent with a lipoma. This is benign. We will monitor for now.        Genitourinary   Chronic kidney disease, stage 3b (HCC)   I will check renal labs today. Continue focus on blood pressure control, adequate hydration, and avoidance of nephrotoxic medications.         Other   Depression, major, single episode, moderate (HCC)   Continue paroxetine 20 mg daily.      Relevant Medications   PARoxetine (PAXIL) 20 MG tablet   Elevated PSA, less than 10 ng/ml   This has been stable over the past several years. I will reassess his PSA today.      Relevant Orders   PSA   Mixed  dyslipidemia   I will check lipids today. Continue pravastatin 80 mg daily.      Relevant Medications   pravastatin (PRAVACHOL) 80 MG tablet   Other Relevant Orders   Lipid panel   Rosacea   This may be the underlying issue with skin changes on Mr. Pretty face. I will have dermatology assess.      Relevant Orders    Ambulatory referral to Dermatology   Tobacco use disorder, continuous   Strongly advise smoking cessation. He remains pre-contemplative and not ready to make this change, despite voicing an understanding of the way this impacts various of his health issues.      Relevant Orders   CT CHEST LUNG CANCER SCREENING LOW DOSE WO CONTRAST    Return in about 3 months (around 09/07/2023) for Reassessment.   Loyola Mast, MD

## 2023-06-07 NOTE — Assessment & Plan Note (Signed)
 Mass consistent with a lipoma. This is benign. We will monitor for now.

## 2023-06-07 NOTE — Assessment & Plan Note (Signed)
 Stable.  Continue omeprazole 20mg  daily

## 2023-06-07 NOTE — Assessment & Plan Note (Signed)
 Blood pressure is mildly high. Continue amlodipine 10 mg daily, benazepril 40 mg daily, and HCTZ 25 mg daily. I will check annual renal labs. Strongly advise smoking cessation.

## 2023-06-07 NOTE — Assessment & Plan Note (Signed)
 I will check lipids today. Continue pravastatin 80 mg daily.

## 2023-06-08 LAB — BASIC METABOLIC PANEL WITH GFR
BUN: 18 mg/dL (ref 6–23)
CO2: 27 meq/L (ref 19–32)
Calcium: 9 mg/dL (ref 8.4–10.5)
Chloride: 91 meq/L — ABNORMAL LOW (ref 96–112)
Creatinine, Ser: 1.77 mg/dL — ABNORMAL HIGH (ref 0.40–1.50)
GFR: 39.12 mL/min — ABNORMAL LOW (ref >60.00)
Glucose, Bld: 108 mg/dL — ABNORMAL HIGH (ref 70–99)
Potassium: 4.2 meq/L (ref 3.5–5.1)
Sodium: 130 meq/L — ABNORMAL LOW (ref 135–145)

## 2023-06-08 LAB — CBC
HCT: 41.6 % (ref 39.0–52.0)
Hemoglobin: 14 g/dL (ref 13.0–17.0)
MCHC: 33.7 g/dL (ref 30.0–36.0)
MCV: 94 fl (ref 78.0–100.0)
Platelets: 273 10*3/uL (ref 150.0–400.0)
RBC: 4.42 Mil/uL (ref 4.22–5.81)
RDW: 13.4 % (ref 11.5–15.5)
WBC: 8.4 10*3/uL (ref 4.0–10.5)

## 2023-06-08 LAB — LIPID PANEL
Cholesterol: 156 mg/dL (ref 0–200)
HDL: 25.7 mg/dL — ABNORMAL LOW (ref >39.00)
LDL Cholesterol: 52 mg/dL (ref 0–99)
NonHDL: 130.13
Total CHOL/HDL Ratio: 6
Triglycerides: 389 mg/dL — ABNORMAL HIGH (ref 0.0–149.0)
VLDL: 77.8 mg/dL — ABNORMAL HIGH (ref 0.0–40.0)

## 2023-06-09 ENCOUNTER — Encounter: Payer: Self-pay | Admitting: Family Medicine

## 2023-06-09 LAB — PSA: PSA: 4.88 ng/mL — ABNORMAL HIGH (ref 0.10–4.00)

## 2023-07-03 ENCOUNTER — Ambulatory Visit
Admission: RE | Admit: 2023-07-03 | Discharge: 2023-07-03 | Disposition: A | Discharge status: Home / Self Care | Source: Ambulatory Visit | Attending: Family Medicine

## 2023-07-03 DIAGNOSIS — Z122 Encounter for screening for malignant neoplasm of respiratory organs: Secondary | ICD-10-CM | POA: Diagnosis not present

## 2023-07-03 DIAGNOSIS — F17209 Nicotine dependence, unspecified, with unspecified nicotine-induced disorders: Secondary | ICD-10-CM

## 2023-07-03 DIAGNOSIS — F17210 Nicotine dependence, cigarettes, uncomplicated: Secondary | ICD-10-CM | POA: Diagnosis not present

## 2023-07-05 ENCOUNTER — Encounter: Payer: Self-pay | Admitting: Family Medicine

## 2023-07-27 ENCOUNTER — Encounter: Payer: Self-pay | Admitting: Family Medicine

## 2023-07-28 ENCOUNTER — Other Ambulatory Visit: Payer: Self-pay | Admitting: Family Medicine

## 2023-07-28 ENCOUNTER — Ambulatory Visit: Payer: Self-pay | Admitting: Family Medicine

## 2023-07-28 DIAGNOSIS — N401 Enlarged prostate with lower urinary tract symptoms: Secondary | ICD-10-CM

## 2023-07-28 NOTE — Telephone Encounter (Signed)
 Called GI Radiology to have them push the results.  Dm/cma

## 2023-07-31 ENCOUNTER — Other Ambulatory Visit: Payer: Self-pay | Admitting: Family Medicine

## 2023-07-31 DIAGNOSIS — N401 Enlarged prostate with lower urinary tract symptoms: Secondary | ICD-10-CM

## 2023-08-11 ENCOUNTER — Other Ambulatory Visit: Payer: Self-pay | Admitting: Family Medicine

## 2023-08-11 DIAGNOSIS — J439 Emphysema, unspecified: Secondary | ICD-10-CM

## 2023-10-08 ENCOUNTER — Other Ambulatory Visit: Payer: Self-pay | Admitting: Family Medicine

## 2023-10-08 DIAGNOSIS — F411 Generalized anxiety disorder: Secondary | ICD-10-CM

## 2023-10-09 NOTE — Telephone Encounter (Signed)
 Refill request for  Alprazolam  0.5 mg LR 04/03/23, #60, 3 rf LOV  06/07/23 FOV  none scheduled.    Please review and advise.  Thanks.  Dm/cma

## 2023-11-14 ENCOUNTER — Other Ambulatory Visit: Payer: Self-pay | Admitting: Family Medicine

## 2023-11-14 DIAGNOSIS — J439 Emphysema, unspecified: Secondary | ICD-10-CM

## 2023-11-16 NOTE — Telephone Encounter (Signed)
 Left VM to rtn call to schedule appt. Dm/cma

## 2023-11-20 NOTE — Telephone Encounter (Signed)
 Left VM to rtn call to schedule appt. Dm/cma

## 2023-11-22 NOTE — Telephone Encounter (Signed)
 Left VM to rtn call to schedule appt. Dm/cma

## 2023-11-24 ENCOUNTER — Encounter: Payer: Self-pay | Admitting: Family Medicine

## 2023-11-24 NOTE — Telephone Encounter (Signed)
 Letter mailed to home address to call the office. Dm/cma

## 2023-12-01 ENCOUNTER — Ambulatory Visit

## 2023-12-01 DIAGNOSIS — Z0000 Encounter for general adult medical examination without abnormal findings: Secondary | ICD-10-CM

## 2023-12-01 NOTE — Patient Instructions (Signed)
 [name],  Thank you for taking the time for your Medicare Wellness Visit. I appreciate your continued commitment to your health goals. Please review the care plan we discussed, and feel free to reach out if I can assist you further.  Medicare recommends these wellness visits once per year to help you and your care team stay ahead of potential health issues. These visits are designed to focus on prevention, allowing your provider to concentrate on managing your acute and chronic conditions during your regular appointments.  Please note that Annual Wellness Visits do not include a physical exam. Some assessments may be limited, especially if the visit was conducted virtually. If needed, we may recommend a separate in-person follow-up with your provider.  Ongoing Care Seeing your primary care provider every 3 to 6 months helps us  monitor your health and provide consistent, personalized care.   Referrals If a referral was made during today's visit and you haven't received any updates within two weeks, please contact the referred provider directly to check on the status.  Recommended Screenings:  Health Maintenance  Topic Date Due   Hepatitis C Screening  Never done   Pneumococcal Vaccine for age over 53 (1 of 2 - PCV) Never done   Zoster (Shingles) Vaccine (1 of 2) Never done   Colon Cancer Screening  06/02/2023   Flu Shot  10/13/2023   COVID-19 Vaccine (4 - 2025-26 season) 11/13/2023   Screening for Lung Cancer  07/02/2024   Medicare Annual Wellness Visit  11/30/2024   DTaP/Tdap/Td vaccine (2 - Td or Tdap) 05/04/2029   HPV Vaccine  Aged Out   Meningitis B Vaccine  Aged Out       12/01/2023    4:14 PM  Advanced Directives  Does Patient Have a Medical Advance Directive? Yes  Type of Estate agent of [place];Living will  Copy of Healthcare Power of Attorney in Chart? No - copy requested   Advance Care Planning is important because it: Ensures you receive  medical care that aligns with your values, goals, and preferences. Provides guidance to your family and loved ones, reducing the emotional burden of decision-making during critical moments.  Vision: Annual vision screenings are recommended for early detection of glaucoma, cataracts, and diabetic retinopathy. These exams can also reveal signs of chronic conditions such as diabetes and high blood pressure.  Dental: Annual dental screenings help detect early signs of oral cancer, gum disease, and other conditions linked to overall health, including heart disease and diabetes.  Please see the attached documents for additional preventive care recommendations.

## 2023-12-01 NOTE — Progress Notes (Addendum)
 Subjective:   [name] is a 68 y.o. who presents for a Medicare Wellness preventive visit.  As a reminder, Annual Wellness Visits don't include a physical exam, and some assessments may be limited, especially if this visit is performed virtually. We may recommend an in-person follow-up visit with your provider if needed.  Visit Complete: Virtual I connected with  [name] on 12/01/23 by a audio enabled telemedicine application and verified that I am speaking with the correct person using two identifiers.  Patient Location: Home  Provider Location: Office/Clinic  I discussed the limitations of evaluation and management by telemedicine. The patient expressed understanding and agreed to proceed.  Vital Signs: Because this visit was a virtual/telehealth visit, some criteria may be missing or patient reported. Any vitals not documented were not able to be obtained and vitals that have been documented are patient reported.  VideoError- Librarian, academic were attempted between this provider and patient, however failed, due to patient having technical difficulties OR patient did not have access to video capability.  We continued and completed visit with audio only.   Persons Participating in Visit: Patient.  AWV Questionnaire: Yes: Patient Medicare AWV questionnaire was completed by the patient on 11/27/2023; I have confirmed that all information answered by patient is correct and no changes since this date.  Cardiac Risk Factors include: advanced age (>61men, >43 women);dyslipidemia;hypertension;male gender     Objective:    Today's Vitals   There is no height or weight on file to calculate BMI.     12/01/2023    4:14 PM 05/06/2019   12:01 AM 05/05/2019    3:52 PM 07/04/2017    2:00 PM 09/22/2015    9:39 AM 02/24/2015    9:52 AM 07/22/2014   11:12 AM  Advanced Directives  Does Patient Have a Medical Advance Directive? Yes  No No  No  No  No    Type of Estate agent of [place];Living will        Copy of Healthcare Power of Attorney in Chart? No - copy requested        Would patient like information on creating a medical advance directive?  No - Patient declined  No - Patient declined  No - patient declined information  No - patient declined information  No - patient declined information      Data saved with a previous flowsheet row definition    Current Medications (verified) Outpatient Encounter Medications as of 12/01/2023  Medication Sig   acetaminophen  (TYLENOL ) 325 MG tablet Take 650 mg by mouth every 6 (six) hours as needed for moderate pain.   albuterol  (VENTOLIN  HFA) 108 (90 Base) MCG/ACT inhaler INHALE 2 PUFFS INTO THE LUNGS EVERY 6 HOURS AS NEEDED FOR WHEEZING OR SHORTNESS OF BREATH   ALPRAZolam  (XANAX ) 0.5 MG tablet TAKE 1 TABLET(0.5 MG) BY MOUTH TWICE DAILY AS NEEDED FOR ANXIETY   amLODipine  (NORVASC ) 10 MG tablet Take 1 tablet (10 mg total) by mouth daily.   ASPIRIN  LOW DOSE 81 MG EC tablet TAKE 1 TABLET BY MOUTH EVERY DAY   benazepril  (LOTENSIN ) 40 MG tablet Take 1 tablet (40 mg total) by mouth daily.   dutasteride  (AVODART ) 0.5 MG capsule TAKE 1 CAPSULE(0.5 MG) BY MOUTH DAILY   Fluticasone-Umeclidin-Vilant (TRELEGY ELLIPTA ) 100-62.5-25 MCG/ACT AEPB Inhale 1 puff into the lungs daily.   hydrochlorothiazide  (HYDRODIURIL ) 25 MG tablet Take 1 tablet (25 mg total) by mouth daily.   Multiple Vitamin (MULTIVITAMIN) tablet  Take 1 tablet by mouth daily.   omeprazole  (PRILOSEC) 20 MG capsule TAKE 1 CAPSULE BY MOUTH EVERY DAY   PARoxetine  (PAXIL ) 20 MG tablet Take 1 tablet (20 mg total) by mouth daily.   pravastatin  (PRAVACHOL ) 80 MG tablet Take 1 tablet (80 mg total) by mouth daily.   tamsulosin  (FLOMAX ) 0.4 MG CAPS capsule TAKE 2 CAPSULES(0.8 MG) BY MOUTH DAILY   magnesium  30 MG tablet Take 30 mg by mouth 2 (two) times daily. (Patient not taking: Reported on 12/01/2023)   No facility-administered  encounter medications on file as of 12/01/2023.    Allergies (verified) Cortisone, Fentanyl , and Sulfonamide derivatives   History: Past Medical History:  Diagnosis Date   AAA (abdominal aortic aneurysm) (HCC)    AAA (abdominal aortic aneurysm) without rupture (HCC)    Arthritis    BPH (benign prostatic hyperplasia)    Claudication (HCC)    lower extremities   Claustrophobia    panic attacks   COLONIC POLYPS, HX OF 05/02/2007   COPD (chronic obstructive pulmonary disease) (HCC)    DVT (deep venous thrombosis) (HCC)    Dyspnea    with exertion   GERD 11/08/2006   HYPERCHOLESTEROLEMIA, SEVERE 05/02/2007   HYPERTENSION 11/08/2006   LOW BACK PAIN 11/22/2007   NEOPLASM, MALIGNANT, SKIN, 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   PONV (postoperative nausea and vomiting)    Seizures (HCC)    from concussion several months ago has been having headache   Sepsis (HCC) 05/05/2019   Weak urine stream    Slow to get started   Past Surgical History:  Procedure Laterality Date   ABDOMINAL AORTIC ENDOVASCULAR STENT GRAFT N/A 07/17/2017   Procedure: ABDOMINAL AORTIC ENDOVASCULAR STENT GRAFT REPAIR WITH ILIAC BRANCH DEVICE;  Surgeon: Oris Krystal FALCON, MD;  Location: [facility] OR;  Service: Vascular;  Laterality: N/A;   ANGIOPLASTY  09/20/10   Rt AK to tibioperoneal trunk BPG w/ vein patch angioplasty of tibioperoneal trunk    CARPAL TUNNEL RELEASE Bilateral    FOOT SURGERY     right x2   MOHS SURGERY     lower lip   PR VEIN BYPASS GRAFT,AORTO-FEM-POP     TENDON REPAIR Right 2006   Forearm   Family History  Problem Relation Age of Onset   Heart disease Mother        Heart Disease before age 69   Hyperlipidemia Mother    Hypertension Mother    Heart attack Mother    Heart disease Father        Heart Disease before age 63   Hyperlipidemia Father    Heart attack Father    Pulmonary embolism Sister    Adrenal disorder Brother    Hyperlipidemia Brother    Hypertension Brother    Heart disease Brother        Heart  Disease before age 57   Heart attack Brother    Colon cancer Neg Hx    Colon polyps Neg Hx    Esophageal cancer Neg Hx    Stomach cancer Neg Hx    Rectal cancer Neg Hx    Social History   Socioeconomic History   Marital status: Widowed    Spouse name: Not on file   Number of children: Not on file   Years of education: Not on file   Highest education level: 12th grade  Occupational History   Not on file  Tobacco Use   Smoking status: Every Day    Current packs/day: 1.00    Average packs/day:  1 pack/day for 40.0 years (40.0 ttl pk-yrs)    Types: Cigarettes   Smokeless tobacco: Never  Vaping Use   Vaping status: Never Used  Substance and Sexual Activity   Alcohol use: No    Alcohol/week: 0.0 standard drinks of alcohol   Drug use: No   Sexual activity: Yes  Other Topics Concern   Not on file  Social History Narrative   Not on file   Social Drivers of Health   Financial Resource Strain: Low Risk  (11/27/2023)   Overall Financial Resource Strain (CARDIA)    Difficulty of Paying Living Expenses: Not very hard  Food Insecurity: No Food Insecurity (11/27/2023)   Hunger Vital Sign    Worried About Running Out of Food in the Last Year: Never true    Ran Out of Food in the Last Year: Never true  Transportation Needs: No Transportation Needs (11/27/2023)   PRAPARE - Administrator, Civil Service (Medical): No    Lack of Transportation (Non-Medical): No  Physical Activity: Inactive (11/27/2023)   Exercise Vital Sign    Days of Exercise per Week: 0 days    Minutes of Exercise per Session: 0 min  Stress: No Stress Concern Present (11/27/2023)   [employer] of Occupational Health - Occupational Stress Questionnaire    Feeling of Stress: Only a little  Social Connections: Unknown (11/27/2023)   Social Connection and Isolation Panel    Frequency of Communication with Friends and Family: More than three times a week    Frequency of Social Gatherings with Friends and  Family: More than three times a week    Attends Religious Services: Patient declined    Database administrator or Organizations: Patient declined    Attends Banker Meetings: Not on file    Marital Status: Widowed    Tobacco Counseling Ready to quit: Not Answered Counseling given: Not Answered    Clinical Intake:  Pre-visit preparation completed: Yes  Pain : No/denies pain     Nutritional Risks: None Diabetes: No  Lab Results  Component Value Date   HGBA1C 5.6 06/07/2006     How often do you need to have someone help you when you read instructions, pamphlets, or other written materials from your doctor or pharmacy?: 1 - Never  Interpreter Needed?: No  Information entered by :: NAllen LPN   Activities of Daily Living     11/27/2023    3:56 PM  In your present state of health, do you have any difficulty performing the following activities:  Hearing? 0  Vision? 0  Difficulty concentrating or making decisions? 0  Walking or climbing stairs? 1  Dressing or bathing? 0  Doing errands, shopping? 0  Preparing Food and eating ? N  Using the Toilet? N  In the past six months, have you accidently leaked urine? N  Do you have problems with loss of bowel control? N  Managing your Medications? N  Managing your Finances? N  Housekeeping or managing your Housekeeping? N    Patient Care Team: Thedora Garnette HERO, MD as PCP - General (Family Medicine) Early, Krystal FALCON, MD (Inactive) as Consulting Physician (Vascular Surgery) Revankar, Jennifer SAUNDERS, MD as Consulting Physician (Cardiology)  I have updated your Care Teams any recent Medical Services you may have received from other providers in the past year.     Assessment:   This is a routine wellness examination for [name].  Hearing/Vision screen Hearing Screening - Comments:: Denies hearing issues  Vision Screening - Comments:: No regular eye exams,    Goals Addressed             This Visit's Progress     Patient Stated       12/01/2023, denies goals       Depression Screen     12/01/2023    4:15 PM 06/07/2023    3:22 PM 03/30/2022    4:03 PM 06/02/2021    4:21 PM 05/07/2020   10:06 AM 03/11/2019    3:49 PM 07/27/2018    1:46 PM  PHQ 2/9 Scores  PHQ - 2 Score 0 1 0 1 1 2 5   PHQ- 9 Score  4 2 5  4 8     Fall Risk     11/27/2023    3:56 PM 06/07/2023    3:22 PM 03/30/2022    3:42 PM 06/02/2021    4:07 PM 05/07/2020   10:06 AM  Fall Risk   Falls in the past year? 0 0 1 1 0  Number falls in past yr: 0 0 0 1   Injury with Fall? 0 0 1 1   Comment   shoulder LT shoulder pain   Risk for fall due to : Medication side effect No Fall Risks History of fall(s) History of fall(s)   Follow up Falls prevention discussed;Falls evaluation completed Falls evaluation completed Falls evaluation completed  Falls evaluation completed       Data saved with a previous flowsheet row definition    MEDICARE RISK AT HOME:  Medicare Risk at Home Any stairs in or around the home?: (Patient-Rptd) No Home free of loose throw rugs in walkways, pet beds, electrical cords, etc?: (Patient-Rptd) Yes Adequate lighting in your home to reduce risk of falls?: (Patient-Rptd) Yes Life alert?: (Patient-Rptd) Yes Use of a cane, walker or w/c?: (Patient-Rptd) Yes Grab bars in the bathroom?: (Patient-Rptd) Yes Shower chair or bench in shower?: (Patient-Rptd) No Elevated toilet seat or a handicapped toilet?: (Patient-Rptd) Yes  TIMED UP AND GO:  Was the test performed?  No  Cognitive Function: 6CIT completed        12/01/2023    4:15 PM  6CIT Screen  What Year? 0 points  What month? 0 points  What time? 0 points  Count back from 20 0 points  Months in reverse 0 points  Repeat phrase 0 points  Total Score 0 points    Immunizations Immunization History  Administered Date(s) Administered   Influenza Split 12/23/2011, 11/26/2012   Influenza,inj,Quad PF,6+ Mos 12/11/2014, 02/09/2016   Influenza-Unspecified  01/08/2014   PFIZER(Purple Top)SARS-COV-2 Vaccination 05/22/2019, 06/12/2019, 12/29/2019   Tdap 05/05/2019    Screening Tests Health Maintenance  Topic Date Due   Hepatitis C Screening  Never done   Pneumococcal Vaccine: 50+ Years (1 of 2 - PCV) Never done   Zoster Vaccines- Shingrix (1 of 2) Never done   Colonoscopy  06/02/2023   Influenza Vaccine  10/13/2023   COVID-19 Vaccine (4 - 2025-26 season) 11/13/2023   Lung Cancer Screening  07/02/2024   Medicare Annual Wellness (AWV)  11/30/2024   DTaP/Tdap/Td (2 - Td or Tdap) 05/04/2029   HPV VACCINES  Aged Out   Meningococcal B Vaccine  Aged Out    Health Maintenance Items Addressed: Declines colonoscopy. Can't do flu vaccines. Due for pneumonia, shingles and covid vaccine.  Additional Screening:  Vision Screening: Recommended annual ophthalmology exams for early detection of glaucoma and other disorders of the eye. Is the patient up to  date with their annual eye exam?  No  Who is the provider or what is the name of the office in which the patient attends annual eye exams? Dr. Pamelia  Dental Screening: Recommended annual dental exams for proper oral hygiene  Community Resource Referral / Chronic Care Management: CRR required this visit?  No   CCM required this visit?  No   Plan:    I have personally reviewed and noted the following in the patient's chart:   Medical and social history Use of alcohol, tobacco or illicit drugs  Current medications and supplements including opioid prescriptions. Patient is not currently taking opioid prescriptions. Functional ability and status Nutritional status Physical activity Advanced directives List of other physicians Hospitalizations, surgeries, and ER visits in previous 12 months Vitals Screenings to include cognitive, depression, and falls Referrals and appointments  In addition, I have reviewed and discussed with patient certain preventive protocols, quality metrics, and best  practice recommendations. A written personalized care plan for preventive services as well as general preventive health recommendations were provided to patient.   Anthony FORBES Dawn, LPN   [id number]   After Visit Summary: (MyChart) Due to this being a telephonic visit, the after visit summary with patients personalized plan was offered to patient via MyChart   Notes: Nothing significant to report at this time.

## 2024-01-17 ENCOUNTER — Ambulatory Visit (INDEPENDENT_AMBULATORY_CARE_PROVIDER_SITE_OTHER): Admitting: Family Medicine

## 2024-01-17 VITALS — BP 138/84 | HR 84 | Temp 96.3°F | Ht 76.0 in | Wt 224.0 lb

## 2024-01-17 DIAGNOSIS — Z8679 Personal history of other diseases of the circulatory system: Secondary | ICD-10-CM

## 2024-01-17 DIAGNOSIS — F17209 Nicotine dependence, unspecified, with unspecified nicotine-induced disorders: Secondary | ICD-10-CM

## 2024-01-17 DIAGNOSIS — N1832 Chronic kidney disease, stage 3b: Secondary | ICD-10-CM

## 2024-01-17 DIAGNOSIS — Z0000 Encounter for general adult medical examination without abnormal findings: Secondary | ICD-10-CM | POA: Diagnosis not present

## 2024-01-17 DIAGNOSIS — J439 Emphysema, unspecified: Secondary | ICD-10-CM

## 2024-01-17 DIAGNOSIS — E782 Mixed hyperlipidemia: Secondary | ICD-10-CM

## 2024-01-17 DIAGNOSIS — I10 Essential (primary) hypertension: Secondary | ICD-10-CM

## 2024-01-17 DIAGNOSIS — Z23 Encounter for immunization: Secondary | ICD-10-CM

## 2024-01-17 DIAGNOSIS — I739 Peripheral vascular disease, unspecified: Secondary | ICD-10-CM | POA: Diagnosis not present

## 2024-01-17 DIAGNOSIS — E871 Hypo-osmolality and hyponatremia: Secondary | ICD-10-CM

## 2024-01-17 DIAGNOSIS — Z95828 Presence of other vascular implants and grafts: Secondary | ICD-10-CM

## 2024-01-17 MED ORDER — SPIRONOLACTONE 25 MG PO TABS: 25.0000 mg | ORAL_TABLET | Freq: Every day | ORAL | 3 refills | Status: AC

## 2024-01-17 NOTE — Assessment & Plan Note (Addendum)
 [name] continues to have claudication. He is on lipid lowering therapy, but continues to smoke 1 ppd. It raises concerns for progressive PAD. I had referred him earlier this year to vascular surgery, but he never went. I have recommended this once again for him.

## 2024-01-17 NOTE — Assessment & Plan Note (Signed)
 [name] is having progressive DOE. Continue Trelegy and PRN albuterol . His greatest need is to stop smoking, but he does not feel he can do this. I recommend he monitor his oxygen at home. He may be reaching a point where he will need home oxygen to reduce tachypnea and improve his ability to care for his home and self.

## 2024-01-17 NOTE — Assessment & Plan Note (Addendum)
 Blood pressure is mildly high. Continue amlodipine  10 mg daily and benazepril  40 mg daily. I will add spironolactone 25 mg daily to his regimen. Strongly advise smoking cessation.

## 2024-01-17 NOTE — Assessment & Plan Note (Deleted)
 Last colonoscopy was about a year ago. Dr. Myrtie Neither recommended a 1 year repeat colonoscopy. [name]  declines to have this performed.

## 2024-01-17 NOTE — Assessment & Plan Note (Deleted)
 Stable.  Continue omeprazole 20mg  daily

## 2024-01-17 NOTE — Assessment & Plan Note (Signed)
 Strongly advise smoking cessation. He remains pre-contemplative and not ready to make this change, despite voicing an understanding of the way this impacts various of his health issues.

## 2024-01-17 NOTE — Assessment & Plan Note (Signed)
 Continue paroxetine 20 mg daily

## 2024-01-17 NOTE — Assessment & Plan Note (Signed)
 Possibly was due to thiazide diuretic use. Stopped this last spring. I will recheck his sodium level today.

## 2024-01-17 NOTE — Assessment & Plan Note (Signed)
 Mass consistent with a lipoma. This is benign. We will monitor for now.

## 2024-01-17 NOTE — Assessment & Plan Note (Addendum)
 Overall health is poor. Discussed recommended screenings and immunizations. He agrees to pneumovax. He declines influenza vaccine and colon cancer screening. Continue annual lung cancer screening. Next due in May 2026.

## 2024-01-17 NOTE — Assessment & Plan Note (Signed)
 I will check lipids today. Continue pravastatin 80 mg daily.

## 2024-01-17 NOTE — Assessment & Plan Note (Addendum)
 Stable. Continue focus on blood pressure control, adequate hydration, and avoidance of nephrotoxic medications.

## 2024-01-17 NOTE — Assessment & Plan Note (Signed)
 Recommend follow-up with vascular surgery for routine surveillance.

## 2024-01-17 NOTE — Progress Notes (Signed)
 [facility] [address] [place] [address] Dept: [phone] Dept Fax: [phone]  Annual Physical Office Visit  Subjective:    Patient ID: [name], male    DOB: 01-12-1956, 68 y.o..   MRN: [id number]  Chief Complaint  Patient presents with   Annual Exam    CPE/labs.  C/o having SOB.     History of Present Illness:  Patient is in today for reassessment of chronic medical conditions.   [name] has a history of emphysema. He is managed on albuterol  and Trelegy inhaler. He admits to dyspnea on exertion. He continues to smoke 1 ppd of cigarettes and is not interested in quitting at present.   [name] has a history of hypertension, managed with amlodipine  10 mg daily and benazepril  40 mg daily. He had been on HCTZ , but developed hyponatremia.   [name] has hyperlipidemia, managed with pravastatin  80 mg daily. He has a history of PVD, a previous aorto-femoral-popliteal bypass and a prior endovascular splint for an abdominal aortic aneurysm. He admits to some claudication with walking.   [name] has anxiety and depression. He takes alprazolam  0.5 mg, 1 tablet daily and a 1/2 tablet as needed. He finds this helpful for the anxiety. He is also on Paxil  20 mg daily.   [name] has a history of BPH and a mildly elevated PSA. He is managed on tamsulosin  0.4 mg daily.   [name] notes he has several raised lesions on his face. He is scheduled to see dermatology in Decemebr.  Review of Systems  Constitutional:  Negative for chills, diaphoresis, fever, malaise/fatigue and weight loss.  HENT:  Negative for congestion, ear pain, hearing loss, sinus pain, sore throat and tinnitus.   Eyes:  Negative for blurred vision, pain, discharge and redness.  Respiratory:  Positive for cough and shortness of breath. Negative for wheezing.        History of COPD/emphysema as noted above.  Cardiovascular:  Negative for chest pain  and palpitations.  Gastrointestinal:  Positive for heartburn. Negative for abdominal pain, constipation, diarrhea, nausea and vomiting.       History of GERD. Controlled well with omeprazole .  Musculoskeletal:  Negative for back pain, joint pain and myalgias.  Skin:  Negative for itching and rash.       Skin issues as noted above.  Psychiatric/Behavioral:  Positive for depression. The patient is nervous/anxious.        As noted above.   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   Arthritis 05/07/2020   Dental infection    Near syncope    Hyponatremia 05/06/2019   Pulmonary emphysema (HCC) 03/11/2019   Depression, major, single episode, moderate (HCC) 07/27/2018   Generalized anxiety disorder 07/27/2018   Elevated PSA, less than 10 ng/ml 01/17/2018   Muscle cramps 01/17/2018   Chronic kidney disease, stage 3b (HCC) 08/16/2017   Mixed dyslipidemia 06/01/2017   Tobacco use disorder, continuous 06/01/2017   Benign prostatic hyperplasia with urinary hesitancy 09/22/2015   Peripheral vascular disease, unspecified 04/26/2011   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   Cancer of the lip, oral cavity, 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   History of colon polyps 05/02/2007   Essential hypertension 11/08/2006   GERD 11/08/2006   Past Surgical History:  Procedure Laterality Date   ABDOMINAL AORTIC ENDOVASCULAR STENT GRAFT N/A 07/17/2017   Procedure: ABDOMINAL AORTIC ENDOVASCULAR STENT GRAFT REPAIR WITH ILIAC BRANCH DEVICE;  Surgeon: Oris Krystal FALCON, MD;  Location: [facility] OR;  Service: Vascular;  Laterality: N/A;   ANGIOPLASTY  09/20/10   Rt AK to tibioperoneal trunk  BPG w/ vein patch angioplasty of tibioperoneal trunk    CARPAL TUNNEL RELEASE Bilateral    FOOT SURGERY     right x2   MOHS SURGERY     lower lip   PR VEIN BYPASS GRAFT,AORTO-FEM-POP     TENDON REPAIR Right 2006   Forearm   Family History  Problem Relation Age of Onset   Heart disease Mother        Heart Disease before age 24   Hyperlipidemia Mother    Hypertension Mother    Heart attack Mother    Heart disease Father        Heart Disease before age 36   Hyperlipidemia Father    Heart attack Father    Pulmonary embolism Sister    Adrenal disorder Brother    Hyperlipidemia Brother    Hypertension Brother    Heart disease Brother        Heart Disease before age 61   Heart attack Brother    Colon cancer Neg Hx    Colon polyps Neg Hx    Esophageal cancer Neg Hx    Stomach cancer Neg Hx    Rectal cancer Neg Hx    Outpatient Medications Prior to Visit  Medication Sig Dispense Refill   acetaminophen  (TYLENOL ) 325 MG tablet Take 650 mg by mouth every 6 (six) hours as needed for moderate pain.     albuterol  (VENTOLIN  HFA) 108 (90 Base) MCG/ACT inhaler INHALE 2 PUFFS INTO THE LUNGS EVERY 6 HOURS AS NEEDED FOR WHEEZING OR SHORTNESS OF BREATH 6.7 g 3   ALPRAZolam  (XANAX ) 0.5 MG tablet TAKE 1 TABLET(0.5 MG) BY MOUTH TWICE DAILY AS NEEDED FOR ANXIETY 60 tablet 2   amLODipine  (NORVASC ) 10 MG tablet Take 1 tablet (10 mg total) by mouth daily. 90 tablet 3   ASPIRIN  LOW DOSE 81 MG EC tablet TAKE 1 TABLET BY MOUTH EVERY DAY 120 tablet 1   benazepril  (LOTENSIN ) 40 MG tablet Take 1 tablet (40 mg total) by mouth daily. 90 tablet 3   dutasteride  (AVODART ) 0.5 MG capsule TAKE 1 CAPSULE(0.5 MG) BY MOUTH DAILY 90 capsule 3   Fluticasone-Umeclidin-Vilant (TRELEGY ELLIPTA ) 100-62.5-25 MCG/ACT AEPB Inhale 1 puff into the lungs daily. 1 each 11   hydrochlorothiazide  (HYDRODIURIL ) 25 MG tablet Take 1 tablet (25 mg total) by mouth daily. 90 tablet 2   magnesium  30 MG tablet Take 30 mg by mouth 2 (two)  times daily. (Patient not taking: Reported on 12/01/2023)     Multiple Vitamin (MULTIVITAMIN) tablet Take 1 tablet by mouth daily.     omeprazole  (PRILOSEC) 20 MG capsule TAKE 1 CAPSULE BY MOUTH EVERY DAY 90 capsule 3   PARoxetine  (PAXIL ) 20 MG tablet Take 1 tablet (20 mg total) by mouth daily. 90 tablet 3   pravastatin  (PRAVACHOL ) 80 MG tablet Take 1 tablet (80 mg total) by mouth daily. 90 tablet 3   tamsulosin  (FLOMAX ) 0.4 MG CAPS capsule TAKE 2 CAPSULES(0.8 MG) BY MOUTH DAILY 180 capsule 3   No facility-administered medications prior to visit.  Allergies  Allergen Reactions   Cortisone    Fentanyl      hypotension   Sulfonamide Derivatives     REACTION: rash     Objective:   Today's Vitals   01/17/24 1530  BP: 138/84  Pulse: 84  Temp: (!) 96.3 F (35.7 C)  TempSrc: Temporal  Weight: 224 lb (101.6 kg)  Height: 6' 4 (1.93 m)   Body mass index is 27.27 kg/m.   General: Well developed, well nourished. No acute distress. HEENT: Normocephalic, non-traumatic. PERRL, EOMI. Conjunctiva clear. External ears normal. EAC and TMs normal bilaterally.   Nose  clear without congestion or rhinorrhea. Mucous membranes moist. Oropharynx clear.Fair dentition. Neck: Supple. No lymphadenopathy. No thyromegaly. Lungs: Decreased breath sounds with a prolonged expiratory phase. No wheezing, rales or rhonchi. CV: RRR without murmurs or rubs. Pulses 2+ bilaterally. Abdomen: Soft, non-tender. Bowel sounds positive, normal pitch and frequency. No hepatosplenomegaly. No rebound or guarding. Back: Straight. No CVA tenderness bilaterally. Extremities: Full ROM. No joint swelling or tenderness. No edema noted. Feet without any skin lesions. Pulses difficult to palpate. Skin: Warm and dry. Coarsening of the skin of the face, with multiple comedones present. There are also   multiple raised cystic lesions, some of which are pedunculated on the face. There is a 8 cm soft, smooth   mobil subcutaneous mass  over the right posterior neck Neuro: CN II-XII intact. Normal sensation and DTR bilaterally. Psych: Alert and oriented. Normal mood and affect.  Health Maintenance Due  Topic Date Due   Hepatitis C Screening  Never done   Pneumococcal Vaccine: 50+ Years (1 of 2 - PCV) Never done   Zoster Vaccines- Shingrix (1 of 2) Never done   Colonoscopy  06/02/2023   Influenza Vaccine  10/13/2023   COVID-19 Vaccine (4 - 2025-26 season) 11/13/2023   Imaging CT CHEST WITHOUT CONTRAST LOW-DOSE FOR LUNG CANCER SCREENING   IMPRESSION: 1. Lung-RADS 1, negative. Continue annual screening with low-dose chest CT without contrast in 12 months. 2. Chronic gastric wall thickening. 3. Aortic atherosclerosis (ICD10-I70.0). Three-vessel coronary artery calcification. 4.  Emphysema     Lab Results    Latest Ref Rng & Units 06/07/2023    3:57 PM 06/13/2019    3:28 PM 05/08/2019    4:53 AM  CBC  WBC 4.0 - 10.5 K/uL 8.4  7.1  7.2   Hemoglobin 13.0 - 17.0 g/dL 85.9  83.8  84.9   Hematocrit 39.0 - 52.0 % 41.6  47.8  45.1   Platelets 150.0 - 400.0 K/uL 273.0  197  236       Latest Ref Rng & Units 06/07/2023    3:57 PM 03/30/2022    4:21 PM 05/07/2020   10:44 AM  CMP  Glucose 70 - 99 mg/dL 891  884  889   BUN 6 - 23 mg/dL 18  15  23    Creatinine 0.40 - 1.50 mg/dL 8.22  8.24  7.98   Sodium 135 - 145 mEq/L 130  133  129   Potassium 3.5 - 5.1 mEq/L 4.2  3.6  4.5   Chloride 96 - 112 mEq/L 91  96  94   CO2 19 - 32 mEq/L 27  24  27    Calcium 8.4 - 10.5 mg/dL 9.0  9.2  89.9    Last lipids Lab Results  Component Value Date   CHOL 156 06/07/2023   HDL 25.70 (L) 06/07/2023   LDLCALC 52 06/07/2023   LDLDIRECT 60.0 03/30/2022   TRIG  389.0 (H) 06/07/2023   CHOLHDL 6 06/07/2023     Assessment & Plan:   Problem List Items Addressed This Visit       Cardiovascular and Mediastinum   Essential hypertension   Blood pressure is mildly high. Continue amlodipine  10 mg daily and benazepril  40 mg daily. I will add  spironolactone 25 mg daily to his regimen. Strongly advise smoking cessation.      Relevant Medications   spironolactone (ALDACTONE) 25 MG tablet   Peripheral vascular disease, unspecified   Mr. Pilant continues to have claudication. He is on lipid lowering therapy, but continues to smoke 1 ppd. It raises concerns for progressive PAD. I had referred him earlier this year to vascular surgery, but he never went. I have recommended this once again for him.      Relevant Medications   spironolactone (ALDACTONE) 25 MG tablet   Other Relevant Orders   Ambulatory referral to Vascular Surgery     Respiratory   Pulmonary emphysema [facility])   Mr. Grewell is having progressive DOE. Continue Trelegy and PRN albuterol . His greatest need is to stop smoking, but he does not feel he can do this. I recommend he monitor his oxygen at home. He may be reaching a point where he will need home oxygen to reduce tachypnea and improve his ability to care for his home and self.        Genitourinary   Chronic kidney disease, stage 3b (HCC)   Stable. Continue focus on blood pressure control, adequate hydration, and avoidance of nephrotoxic medications.         Other   Annual physical exam - Primary   Overall health is poor. Discussed recommended screenings and immunizations. He agrees to pneumovax. He declines influenza vaccine and colon cancer screening. Continue annual lung cancer screening. Next due in May 2026.       History of endovascular stent graft for abdominal aortic aneurysm (AAA)   Recommend follow-up with vascular surgery for routine surveillance.      Relevant Orders   Ambulatory referral to Vascular Surgery   Hyponatremia   Possibly was due to thiazide diuretic use. Stopped this last spring. I will recheck his sodium level today.      Relevant Orders   Basic metabolic panel with GFR   Mixed dyslipidemia   I will check lipids today. Continue pravastatin  80 mg daily.      Tobacco use  disorder, continuous   Strongly advise smoking cessation. He remains pre-contemplative and not ready to make this change, despite voicing an understanding of the way this impacts various of his health issues.      Other Visit Diagnoses       Need for pneumococcal 20-valent conjugate vaccination       Relevant Orders   Pneumococcal conjugate vaccine 20-valent (Completed)       Return in about 6 months (around 07/16/2024) for Reassessment.   Garnette CHRISTELLA Simpler, MD  I,Emily Lagle,acting as a scribe for Garnette CHRISTELLA Simpler, MD.,have documented all relevant documentation on the behalf of Garnette CHRISTELLA Simpler, MD.  I, Garnette CHRISTELLA Simpler, MD, have reviewed all documentation for this visit. The documentation on 01/17/2024 for the exam, diagnosis, procedures, and orders are all accurate and complete.

## 2024-01-17 NOTE — Assessment & Plan Note (Signed)
 This has been stable over the past several years.

## 2024-01-18 ENCOUNTER — Ambulatory Visit: Payer: Self-pay | Admitting: Family Medicine

## 2024-01-18 DIAGNOSIS — E876 Hypokalemia: Secondary | ICD-10-CM

## 2024-01-18 LAB — BASIC METABOLIC PANEL WITH GFR
BUN: 8 mg/dL (ref 6–23)
CO2: 28 meq/L (ref 19–32)
Calcium: 9 mg/dL (ref 8.4–10.5)
Chloride: 96 meq/L (ref 96–112)
Creatinine, Ser: 1.43 mg/dL (ref 0.40–1.50)
GFR: 50.31 mL/min — ABNORMAL LOW (ref >60.00)
Glucose, Bld: 128 mg/dL — ABNORMAL HIGH (ref 70–99)
Potassium: 2.9 meq/L — ABNORMAL LOW (ref 3.5–5.1)
Sodium: 137 meq/L (ref 135–145)

## 2024-01-18 MED ORDER — POTASSIUM CHLORIDE CRYS ER 20 MEQ PO TBCR: 20.0000 meq | EXTENDED_RELEASE_TABLET | Freq: Every day | ORAL | 0 refills | Status: AC

## 2024-01-22 ENCOUNTER — Other Ambulatory Visit: Payer: Self-pay

## 2024-01-22 DIAGNOSIS — I739 Peripheral vascular disease, unspecified: Secondary | ICD-10-CM

## 2024-01-22 DIAGNOSIS — I7143 Infrarenal abdominal aortic aneurysm, without rupture: Secondary | ICD-10-CM

## 2024-02-05 ENCOUNTER — Other Ambulatory Visit: Payer: Self-pay

## 2024-02-05 ENCOUNTER — Emergency Department (HOSPITAL_COMMUNITY)

## 2024-02-05 ENCOUNTER — Emergency Department (HOSPITAL_COMMUNITY): Admission: EM | Admit: 2024-02-05 | Discharge: 2024-02-06 | Disposition: A

## 2024-02-05 ENCOUNTER — Encounter (HOSPITAL_COMMUNITY): Payer: Self-pay | Admitting: Emergency Medicine

## 2024-02-05 DIAGNOSIS — Z7901 Long term (current) use of anticoagulants: Secondary | ICD-10-CM | POA: Diagnosis not present

## 2024-02-05 DIAGNOSIS — S3993XA Unspecified injury of pelvis, initial encounter: Secondary | ICD-10-CM | POA: Diagnosis not present

## 2024-02-05 DIAGNOSIS — Y92410 Unspecified street and highway as the place of occurrence of the external cause: Secondary | ICD-10-CM | POA: Diagnosis not present

## 2024-02-05 DIAGNOSIS — S0990XA Unspecified injury of head, initial encounter: Secondary | ICD-10-CM | POA: Diagnosis not present

## 2024-02-05 DIAGNOSIS — M47816 Spondylosis without myelopathy or radiculopathy, lumbar region: Secondary | ICD-10-CM | POA: Diagnosis not present

## 2024-02-05 DIAGNOSIS — S3991XA Unspecified injury of abdomen, initial encounter: Secondary | ICD-10-CM | POA: Diagnosis not present

## 2024-02-05 DIAGNOSIS — J441 Chronic obstructive pulmonary disease with (acute) exacerbation: Secondary | ICD-10-CM | POA: Insufficient documentation

## 2024-02-05 DIAGNOSIS — Z79899 Other long term (current) drug therapy: Secondary | ICD-10-CM | POA: Insufficient documentation

## 2024-02-05 DIAGNOSIS — S1093XA Contusion of unspecified part of neck, initial encounter: Secondary | ICD-10-CM | POA: Insufficient documentation

## 2024-02-05 DIAGNOSIS — M47812 Spondylosis without myelopathy or radiculopathy, cervical region: Secondary | ICD-10-CM | POA: Diagnosis not present

## 2024-02-05 DIAGNOSIS — I6521 Occlusion and stenosis of right carotid artery: Secondary | ICD-10-CM | POA: Diagnosis not present

## 2024-02-05 DIAGNOSIS — M546 Pain in thoracic spine: Secondary | ICD-10-CM | POA: Diagnosis present

## 2024-02-05 DIAGNOSIS — Z7982 Long term (current) use of aspirin: Secondary | ICD-10-CM | POA: Insufficient documentation

## 2024-02-05 DIAGNOSIS — I6529 Occlusion and stenosis of unspecified carotid artery: Secondary | ICD-10-CM

## 2024-02-05 DIAGNOSIS — R29818 Other symptoms and signs involving the nervous system: Secondary | ICD-10-CM | POA: Diagnosis not present

## 2024-02-05 DIAGNOSIS — S199XXA Unspecified injury of neck, initial encounter: Secondary | ICD-10-CM | POA: Diagnosis not present

## 2024-02-05 DIAGNOSIS — I6509 Occlusion and stenosis of unspecified vertebral artery: Secondary | ICD-10-CM

## 2024-02-05 DIAGNOSIS — M160 Bilateral primary osteoarthritis of hip: Secondary | ICD-10-CM | POA: Diagnosis not present

## 2024-02-05 DIAGNOSIS — I6501 Occlusion and stenosis of right vertebral artery: Secondary | ICD-10-CM | POA: Diagnosis not present

## 2024-02-05 DIAGNOSIS — S299XXA Unspecified injury of thorax, initial encounter: Secondary | ICD-10-CM | POA: Diagnosis not present

## 2024-02-05 DIAGNOSIS — R918 Other nonspecific abnormal finding of lung field: Secondary | ICD-10-CM | POA: Diagnosis not present

## 2024-02-05 DIAGNOSIS — I6523 Occlusion and stenosis of bilateral carotid arteries: Secondary | ICD-10-CM | POA: Diagnosis not present

## 2024-02-05 LAB — PROTIME-INR
INR: 1 (ref 0.8–1.2)
Prothrombin Time: 14.2 s (ref 11.4–15.2)

## 2024-02-05 LAB — CBC
HCT: 46.3 % (ref 39.0–52.0)
Hemoglobin: 15.8 g/dL (ref 13.0–17.0)
MCH: 30.5 pg (ref 26.0–34.0)
MCHC: 34.1 g/dL (ref 30.0–36.0)
MCV: 89.4 fL (ref 80.0–100.0)
Platelets: 286 K/uL (ref 150–400)
RBC: 5.18 MIL/uL (ref 4.22–5.81)
RDW: 13.8 % (ref 11.5–15.5)
WBC: 13.3 K/uL — ABNORMAL HIGH (ref 4.0–10.5)
nRBC: 0 % (ref 0.0–0.2)

## 2024-02-05 LAB — COMPREHENSIVE METABOLIC PANEL WITH GFR
ALT: 14 U/L (ref 0–44)
AST: 21 U/L (ref 15–41)
Albumin: 4 g/dL (ref 3.5–5.0)
Alkaline Phosphatase: 55 U/L (ref 38–126)
Anion gap: 15 (ref 5–15)
BUN: 10 mg/dL (ref 8–23)
CO2: 26 mmol/L (ref 22–32)
Calcium: 9.2 mg/dL (ref 8.9–10.3)
Chloride: 96 mmol/L — ABNORMAL LOW (ref 98–111)
Creatinine, Ser: 1.5 mg/dL — ABNORMAL HIGH (ref 0.61–1.24)
GFR, Estimated: 50 mL/min — ABNORMAL LOW (ref >60)
Glucose, Bld: 129 mg/dL — ABNORMAL HIGH (ref 70–99)
Potassium: 3.2 mmol/L — ABNORMAL LOW (ref 3.5–5.1)
Sodium: 137 mmol/L (ref 135–145)
Total Bilirubin: 1 mg/dL (ref 0.0–1.2)
Total Protein: 8.2 g/dL — ABNORMAL HIGH (ref 6.5–8.1)

## 2024-02-05 LAB — SAMPLE TO BLOOD BANK

## 2024-02-05 LAB — I-STAT CHEM 8, ED
BUN: 10 mg/dL (ref 8–23)
Calcium, Ion: 1.01 mmol/L — ABNORMAL LOW (ref 1.15–1.40)
Chloride: 98 mmol/L (ref 98–111)
Creatinine, Ser: 1.6 mg/dL — ABNORMAL HIGH (ref 0.61–1.24)
Glucose, Bld: 131 mg/dL — ABNORMAL HIGH (ref 70–99)
HCT: 50 % (ref 39.0–52.0)
Hemoglobin: 17 g/dL (ref 13.0–17.0)
Potassium: 3.2 mmol/L — ABNORMAL LOW (ref 3.5–5.1)
Sodium: 137 mmol/L (ref 135–145)
TCO2: 25 mmol/L (ref 22–32)

## 2024-02-05 LAB — ETHANOL: Alcohol, Ethyl (B): <15 mg/dL (ref <15)

## 2024-02-05 LAB — I-STAT CG4 LACTIC ACID, ED: Lactic Acid, Venous: 1.8 mmol/L (ref 0.5–1.9)

## 2024-02-05 MED ORDER — ALBUTEROL SULFATE HFA 108 (90 BASE) MCG/ACT IN AERS: 2.0000 | INHALATION_SPRAY | RESPIRATORY_TRACT | 0 refills | Status: DC | PRN

## 2024-02-05 MED ORDER — IPRATROPIUM-ALBUTEROL 0.5-2.5 (3) MG/3ML IN SOLN
3.0000 mL | Freq: Once | RESPIRATORY_TRACT | Status: AC
  Administered 2024-02-06: 3 mL via RESPIRATORY_TRACT
  Filled 2024-02-05: qty 3

## 2024-02-05 MED ORDER — IOHEXOL 350 MG/ML SOLN
75.0000 mL | Freq: Once | INTRAVENOUS | Status: AC | PRN
  Administered 2024-02-05: 75 mL via INTRAVENOUS

## 2024-02-05 MED ORDER — HYDROMORPHONE HCL 1 MG/ML IJ SOLN
0.5000 mg | Freq: Once | INTRAMUSCULAR | Status: AC
  Administered 2024-02-05: 0.5 mg via INTRAVENOUS
  Filled 2024-02-05: qty 1

## 2024-02-05 MED ORDER — LORAZEPAM 2 MG/ML IJ SOLN
1.0000 mg | INTRAMUSCULAR | Status: DC | PRN
  Administered 2024-02-05: 1 mg via INTRAVENOUS
  Filled 2024-02-05: qty 1

## 2024-02-05 MED ORDER — PROCHLORPERAZINE EDISYLATE 10 MG/2ML IJ SOLN
5.0000 mg | Freq: Once | INTRAMUSCULAR | Status: AC
Start: 2024-02-05 — End: 2024-02-05
  Administered 2024-02-05: 5 mg via INTRAVENOUS
  Filled 2024-02-05: qty 2

## 2024-02-05 MED ORDER — METHYLPREDNISOLONE SODIUM SUCC 125 MG IJ SOLR
125.0000 mg | Freq: Once | INTRAMUSCULAR | Status: AC
  Administered 2024-02-05: 125 mg via INTRAVENOUS
  Filled 2024-02-05: qty 2

## 2024-02-05 MED ORDER — ONDANSETRON HCL 4 MG/2ML IJ SOLN
4.0000 mg | Freq: Once | INTRAMUSCULAR | Status: AC
  Administered 2024-02-05: 4 mg via INTRAVENOUS
  Filled 2024-02-05: qty 2

## 2024-02-05 MED ORDER — DIPHENHYDRAMINE HCL 50 MG/ML IJ SOLN
25.0000 mg | Freq: Once | INTRAMUSCULAR | Status: AC
  Administered 2024-02-05: 25 mg via INTRAVENOUS
  Filled 2024-02-05: qty 1

## 2024-02-05 MED ORDER — PREDNISONE 50 MG PO TABS
ORAL_TABLET | ORAL | 0 refills | Status: AC
Start: 2024-02-05 — End: ?

## 2024-02-05 MED ORDER — IPRATROPIUM-ALBUTEROL 0.5-2.5 (3) MG/3ML IN SOLN
3.0000 mL | Freq: Once | RESPIRATORY_TRACT | Status: AC
  Administered 2024-02-05: 3 mL via RESPIRATORY_TRACT
  Filled 2024-02-05: qty 3

## 2024-02-05 NOTE — ED Notes (Signed)
 C-collar placed in triage. Knot noted to the back of pt's neck which is new per family at the bedside.
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
## 2024-02-05 NOTE — ED Notes (Signed)
   Pt transported to ct
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
## 2024-02-05 NOTE — ED Notes (Signed)
 Called CCMD and placed patient on monitor.

## 2024-02-05 NOTE — ED Provider Notes (Signed)
 Care assumed at 2300.
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
## 2024-02-05 NOTE — ED Notes (Signed)
 He has used 4 puffs on his inhaler in the last 20 minutes.

## 2024-02-05 NOTE — ED Notes (Signed)
 Pt transported to MRI

## 2024-02-05 NOTE — Discharge Instructions (Addendum)
 Please take the prednisone  daily and use 2 puffs of the albuterol  every 4 hours over the next 24 to 48 hours.  You may then use this as needed.  Please be sure to follow-up with your vascular doctor as scheduled or if you have any difficulty getting into see them please call Dr. Gretta who is our vascular surgeon on-call.  Return to the ER for worsening symptoms.

## 2024-02-06 MED ORDER — PREDNISONE 10 MG PO TABS: 40.0000 mg | ORAL_TABLET | Freq: Every day | ORAL | 0 refills | Status: DC

## 2024-02-06 NOTE — ED Notes (Signed)
 Helped ambulate patient while on pulseox. Oxygen level remained at 90 the entire time. Patient's gait was a bit unsteady while walking but family stated that this is normal. Patient stated his legs were starting to bother him. Patient is back in the bed and hooked back up to the monitor.

## 2024-02-06 NOTE — ED Notes (Signed)
 Patient is A&Ox4 upon discharge. Patient verbalized understanding of prescriptions, discharge instructions and follow-up care. Patient ambulatory from ED with steady gait with walking cane and assisted by family

## 2024-02-19 ENCOUNTER — Ambulatory Visit: Admitting: Physician Assistant

## 2024-02-19 ENCOUNTER — Encounter: Payer: Self-pay | Admitting: Physician Assistant

## 2024-02-19 VITALS — BP 155/93 | HR 85

## 2024-02-19 DIAGNOSIS — L700 Acne vulgaris: Secondary | ICD-10-CM | POA: Diagnosis not present

## 2024-02-19 DIAGNOSIS — L578 Other skin changes due to chronic exposure to nonionizing radiation: Secondary | ICD-10-CM

## 2024-02-19 NOTE — Progress Notes (Signed)
   New Patient Visit   Subjective  [name] is a 68 y.o. male NEW PATIENT who presents for the following: Spot check and facial redness  Patient states he  has spot and redness located at the face that he  would like to have examined. Patient reports the areas have been there for 1 year. He reports the areas are not bothersome.SABRA He states that the areas have not spread. Patient reports he  has not previously been treated for these areas. Patient denies Hx of bx.   The patient has spots, moles and lesions to be evaluated, some may be new or changing and the patient may have concern these could be cancer.   The following portions of the chart were reviewed this encounter and updated as appropriate: medications, allergies, medical history  Review of Systems:  No other skin or systemic complaints except as noted in HPI or Assessment and Plan.  Objective  Well appearing patient in no apparent distress; mood and affect are within normal limits.  A focused examination was performed of the following areas: Face  Relevant exam findings are noted in the Assessment and Plan.          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 Paci to see if 2 of these lesions could be medically surgically excised vs; cosmetic removal.  - He definitely has some pressing health issues (carotid stenosis/PAD, etc) and has some very important upcoming appointments that is far more important than this current skin condition.       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  I, Doyce Pan, CMA, am acting as scribe for Lequita Meadowcroft K, PA-C.   Documentation: I have reviewed the above documentation for accuracy and completeness, and I agree with the  above.  Tanajah Boulter K, PA-C

## 2024-02-19 NOTE — Patient Instructions (Signed)

## 2024-02-27 ENCOUNTER — Encounter: Payer: Self-pay | Admitting: Vascular Surgery

## 2024-02-28 NOTE — Progress Notes (Unsigned)
 Patient ID: [name], male   DOB: 10/25/1955, 68 y.o.   MRN: [id number]  Reason for Consult: No chief complaint on file.   Referred by Thedora Garnette HERO, MD  Subjective:     HPI [name] is a 68 y.o. male presenting for vascular evaluation.  He previously underwent EVAR for abdominal aortic aneurysm in 2019 as well as his above-knee to below-knee popliteal bypass with a right in 2012 and was last seen by our clinic in 2019. 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   PONV (postoperative nausea and vomiting)    Seizures (HCC)    from concussion several months ago has been having headache   Sepsis (HCC) 05/05/2019   Weak urine stream    Slow to get started   Family History  Problem Relation Age of Onset   Heart disease Mother        Heart Disease before age 34   Hyperlipidemia Mother    Hypertension Mother    Heart attack Mother    Heart disease Father        Heart Disease before age 34   Hyperlipidemia Father    Heart attack Father    Pulmonary embolism Sister    Adrenal disorder Brother    Hyperlipidemia Brother    Hypertension Brother    Heart disease Brother        Heart Disease before age 43   Heart attack Brother    Colon cancer Neg Hx    Colon polyps Neg Hx    Esophageal cancer Neg Hx    Stomach cancer Neg Hx    Rectal cancer Neg Hx    Past Surgical History:  Procedure Laterality Date   ABDOMINAL AORTIC  ENDOVASCULAR STENT GRAFT N/A 07/17/2017   Procedure: ABDOMINAL AORTIC ENDOVASCULAR STENT GRAFT REPAIR WITH ILIAC BRANCH DEVICE;  Surgeon: Oris Krystal FALCON, MD;  Location: [facility] OR;  Service: Vascular;  Laterality: N/A;   ANGIOPLASTY  09/20/10   Rt AK to tibioperoneal trunk BPG w/ vein patch angioplasty of tibioperoneal trunk    CARPAL TUNNEL RELEASE Bilateral    FOOT SURGERY     right x2   MOHS SURGERY     lower lip   PR VEIN BYPASS GRAFT,AORTO-FEM-POP     TENDON REPAIR Right 2006   Forearm    Short Social History:  Social History   Tobacco Use   Smoking status: Every Day    Current packs/day: 1.00    Average packs/day: 1 pack/day for 40.0 years (40.0 ttl pk-yrs)    Types: Cigarettes   Smokeless tobacco: Never  Substance Use Topics   Alcohol use: No    Alcohol/week: 0.0 standard drinks of alcohol    Allergies[1]  Current Outpatient Medications  Medication Sig Dispense Refill   acetaminophen  (TYLENOL ) 325 MG tablet Take 650 mg by mouth every 6 (six) hours as  needed for moderate pain.     albuterol  (VENTOLIN  HFA) 108 (90 Base) MCG/ACT inhaler INHALE 2 PUFFS INTO THE LUNGS EVERY 6 HOURS AS NEEDED FOR WHEEZING OR SHORTNESS OF BREATH 6.7 g 3   albuterol  (VENTOLIN  HFA) 108 (90 Base) MCG/ACT inhaler Inhale 2 puffs into the lungs every 4 (four) hours as needed for wheezing or shortness of breath. 1 each 0   ALPRAZolam  (XANAX ) 0.5 MG tablet TAKE 1 TABLET(0.5 MG) BY MOUTH TWICE DAILY AS NEEDED FOR ANXIETY 60 tablet 2   amLODipine  (NORVASC ) 10 MG tablet Take 1 tablet (10 mg total) by mouth daily. 90 tablet 3   ASPIRIN  LOW DOSE 81 MG EC tablet TAKE 1 TABLET BY MOUTH EVERY DAY 120 tablet 1   benazepril  (LOTENSIN ) 40 MG tablet Take 1 tablet (40 mg total) by mouth daily. 90 tablet 3   dutasteride  (AVODART ) 0.5 MG capsule TAKE 1 CAPSULE(0.5 MG) BY MOUTH DAILY 90 capsule 3   Fluticasone-Umeclidin-Vilant (TRELEGY ELLIPTA ) 100-62.5-25 MCG/ACT AEPB Inhale 1 puff into the lungs daily. 1 each 11    hydrochlorothiazide  (HYDRODIURIL ) 25 MG tablet Take 1 tablet (25 mg total) by mouth daily. (Patient not taking: Reported on 01/17/2024) 90 tablet 2   Multiple Vitamin (MULTIVITAMIN) tablet Take 1 tablet by mouth daily.     omeprazole  (PRILOSEC) 20 MG capsule TAKE 1 CAPSULE BY MOUTH EVERY DAY 90 capsule 3   PARoxetine  (PAXIL ) 20 MG tablet Take 1 tablet (20 mg total) by mouth daily. 90 tablet 3   potassium chloride  SA (KLOR-CON  M) 20 MEQ tablet Take 1 tablet (20 mEq total) by mouth daily. (Patient not taking: Reported on 02/19/2024) 7 tablet 0   pravastatin  (PRAVACHOL ) 80 MG tablet Take 1 tablet (80 mg total) by mouth daily. 90 tablet 3   predniSONE  (DELTASONE ) 50 MG tablet Take 1 tablet by mouth daily 4 tablet 0   spironolactone  (ALDACTONE ) 25 MG tablet Take 1 tablet (25 mg total) by mouth daily. 90 tablet 3   tamsulosin  (FLOMAX ) 0.4 MG CAPS capsule TAKE 2 CAPSULES(0.8 MG) BY MOUTH DAILY 180 capsule 3   No current facility-administered medications for this visit.    REVIEW OF SYSTEMS  All other systems were reviewed and are negative     Objective:  Objective   There were no vitals filed for this visit. There is no height or weight on file to calculate BMI.  Physical Exam General: no acute distress Cardiac: hemodynamically stable Abdomen: non-tender, no pulsatile mass*** Extremities: no edema, cyanosis or wounds*** Vascular:   Right: ***  Left: ***  Data: EVAR duplex ***  ABI ***  CMP reviewed, creatinine 1.5     Assessment/Plan:   [name] is a 68 y.o. male with EVAR repair in 2019 and right above-knee popliteal to below-knee popliteal in 2012. ***  Recommendations to optimize cardiovascular risk: Abstinence from all tobacco products. Blood glucose control with goal A1c < 7%. Blood pressure control with goal blood pressure < 140/90 mmHg. Lipid reduction therapy with goal LDL-C <55 mg/dL  Aspirin  81mg  PO QD.  Atorvastatin 40-80mg  PO QD (or other high intensity  statin therapy).   Norman GORMAN Serve MD [facility]     [1]  Allergies Allergen Reactions   Cortisone    Fentanyl      hypotension   Sulfonamide Derivatives     REACTION: rash

## 2024-03-01 ENCOUNTER — Ambulatory Visit (HOSPITAL_COMMUNITY)
Admission: RE | Admit: 2024-03-01 | Discharge: 2024-03-01 | Disposition: A | Discharge status: Home / Self Care | Source: Ambulatory Visit | Attending: Surgery | Admitting: Surgery

## 2024-03-01 ENCOUNTER — Ambulatory Visit (HOSPITAL_COMMUNITY): Admission: RE | Admit: 2024-03-01 | Discharge: 2024-03-01 | Attending: Surgery | Admitting: Surgery

## 2024-03-01 ENCOUNTER — Ambulatory Visit (HOSPITAL_COMMUNITY): Admitting: Vascular Surgery

## 2024-03-01 ENCOUNTER — Encounter: Payer: Self-pay | Admitting: Family Medicine

## 2024-03-01 ENCOUNTER — Encounter: Payer: Self-pay | Admitting: Vascular Surgery

## 2024-03-01 VITALS — BP 119/66 | HR 99 | Temp 98.3°F | Resp 24 | Ht 76.0 in | Wt 219.1 lb

## 2024-03-01 DIAGNOSIS — I70213 Atherosclerosis of native arteries of extremities with intermittent claudication, bilateral legs: Secondary | ICD-10-CM

## 2024-03-01 DIAGNOSIS — I7143 Infrarenal abdominal aortic aneurysm, without rupture: Secondary | ICD-10-CM | POA: Diagnosis not present

## 2024-03-01 DIAGNOSIS — I739 Peripheral vascular disease, unspecified: Secondary | ICD-10-CM | POA: Insufficient documentation

## 2024-03-01 LAB — VAS US ABI WITH/WO TBI
Left ABI: 0.5
Right ABI: 0.53

## 2024-03-04 ENCOUNTER — Other Ambulatory Visit: Payer: Self-pay

## 2024-03-04 DIAGNOSIS — I6523 Occlusion and stenosis of bilateral carotid arteries: Secondary | ICD-10-CM

## 2024-03-04 DIAGNOSIS — I739 Peripheral vascular disease, unspecified: Secondary | ICD-10-CM

## 2024-03-10 ENCOUNTER — Other Ambulatory Visit: Payer: Self-pay | Admitting: Family Medicine

## 2024-03-10 DIAGNOSIS — I10 Essential (primary) hypertension: Secondary | ICD-10-CM

## 2024-03-26 ENCOUNTER — Other Ambulatory Visit: Payer: Self-pay | Admitting: Family Medicine

## 2024-04-16 ENCOUNTER — Other Ambulatory Visit: Payer: Self-pay | Admitting: Family Medicine

## 2024-04-16 DIAGNOSIS — F411 Generalized anxiety disorder: Secondary | ICD-10-CM

## 2024-08-23 ENCOUNTER — Ambulatory Visit: Admitting: Vascular Surgery

## 2024-08-23 ENCOUNTER — Ambulatory Visit (HOSPITAL_COMMUNITY)

## 2024-12-06 ENCOUNTER — Ambulatory Visit
